# Patient Record
Sex: Male | Born: 1945 | Race: Black or African American | Hispanic: No | Marital: Married | Smoking: Former smoker
Health system: Southern US, Community
[De-identification: ages and names within clinical notes are randomized; demographics above are authoritative.]

## PROBLEM LIST (undated history)

## (undated) DIAGNOSIS — E785 Hyperlipidemia, unspecified: Secondary | ICD-10-CM

## (undated) HISTORY — DX: Hyperlipidemia, unspecified: E78.5

## (undated) HISTORY — PX: NO PAST SURGERIES: SHX2092

---

## 2016-06-20 DIAGNOSIS — N183 Chronic kidney disease, stage 3 (moderate): Secondary | ICD-10-CM | POA: Diagnosis not present

## 2016-06-20 DIAGNOSIS — E1122 Type 2 diabetes mellitus with diabetic chronic kidney disease: Secondary | ICD-10-CM | POA: Diagnosis not present

## 2016-06-20 DIAGNOSIS — R972 Elevated prostate specific antigen [PSA]: Secondary | ICD-10-CM | POA: Diagnosis not present

## 2016-06-20 DIAGNOSIS — Z0000 Encounter for general adult medical examination without abnormal findings: Secondary | ICD-10-CM | POA: Diagnosis not present

## 2017-01-01 DIAGNOSIS — R972 Elevated prostate specific antigen [PSA]: Secondary | ICD-10-CM | POA: Diagnosis not present

## 2017-01-01 DIAGNOSIS — E1122 Type 2 diabetes mellitus with diabetic chronic kidney disease: Secondary | ICD-10-CM | POA: Diagnosis not present

## 2017-01-01 DIAGNOSIS — N183 Chronic kidney disease, stage 3 (moderate): Secondary | ICD-10-CM | POA: Diagnosis not present

## 2017-06-26 DIAGNOSIS — I10 Essential (primary) hypertension: Secondary | ICD-10-CM | POA: Diagnosis not present

## 2017-06-26 DIAGNOSIS — E119 Type 2 diabetes mellitus without complications: Secondary | ICD-10-CM | POA: Diagnosis not present

## 2017-06-26 DIAGNOSIS — Z125 Encounter for screening for malignant neoplasm of prostate: Secondary | ICD-10-CM | POA: Diagnosis not present

## 2017-07-15 DIAGNOSIS — Z1212 Encounter for screening for malignant neoplasm of rectum: Secondary | ICD-10-CM | POA: Diagnosis not present

## 2017-07-15 DIAGNOSIS — Z1211 Encounter for screening for malignant neoplasm of colon: Secondary | ICD-10-CM | POA: Diagnosis not present

## 2017-10-14 DIAGNOSIS — R972 Elevated prostate specific antigen [PSA]: Secondary | ICD-10-CM | POA: Diagnosis not present

## 2017-10-14 DIAGNOSIS — N452 Orchitis: Secondary | ICD-10-CM | POA: Diagnosis not present

## 2017-10-14 DIAGNOSIS — R7303 Prediabetes: Secondary | ICD-10-CM | POA: Diagnosis not present

## 2017-10-14 DIAGNOSIS — N182 Chronic kidney disease, stage 2 (mild): Secondary | ICD-10-CM | POA: Diagnosis not present

## 2017-10-14 DIAGNOSIS — M182 Bilateral post-traumatic osteoarthritis of first carpometacarpal joints: Secondary | ICD-10-CM | POA: Diagnosis not present

## 2017-10-21 DIAGNOSIS — J069 Acute upper respiratory infection, unspecified: Secondary | ICD-10-CM | POA: Diagnosis not present

## 2017-11-06 DIAGNOSIS — D485 Neoplasm of uncertain behavior of skin: Secondary | ICD-10-CM | POA: Diagnosis not present

## 2017-11-06 DIAGNOSIS — B078 Other viral warts: Secondary | ICD-10-CM | POA: Diagnosis not present

## 2017-11-06 DIAGNOSIS — D229 Melanocytic nevi, unspecified: Secondary | ICD-10-CM | POA: Diagnosis not present

## 2017-11-06 DIAGNOSIS — L821 Other seborrheic keratosis: Secondary | ICD-10-CM | POA: Diagnosis not present

## 2018-01-13 DIAGNOSIS — R7303 Prediabetes: Secondary | ICD-10-CM | POA: Diagnosis not present

## 2018-01-13 DIAGNOSIS — H6121 Impacted cerumen, right ear: Secondary | ICD-10-CM | POA: Diagnosis not present

## 2018-01-13 DIAGNOSIS — Z0000 Encounter for general adult medical examination without abnormal findings: Secondary | ICD-10-CM | POA: Diagnosis not present

## 2018-04-14 DIAGNOSIS — R7309 Other abnormal glucose: Secondary | ICD-10-CM | POA: Diagnosis not present

## 2018-04-14 DIAGNOSIS — E785 Hyperlipidemia, unspecified: Secondary | ICD-10-CM | POA: Diagnosis not present

## 2018-04-14 DIAGNOSIS — N182 Chronic kidney disease, stage 2 (mild): Secondary | ICD-10-CM | POA: Diagnosis not present

## 2018-04-14 DIAGNOSIS — R7303 Prediabetes: Secondary | ICD-10-CM | POA: Diagnosis not present

## 2018-04-14 DIAGNOSIS — H6121 Impacted cerumen, right ear: Secondary | ICD-10-CM | POA: Diagnosis not present

## 2018-04-14 DIAGNOSIS — R799 Abnormal finding of blood chemistry, unspecified: Secondary | ICD-10-CM | POA: Diagnosis not present

## 2018-08-18 DIAGNOSIS — E785 Hyperlipidemia, unspecified: Secondary | ICD-10-CM | POA: Diagnosis not present

## 2018-08-18 DIAGNOSIS — R7303 Prediabetes: Secondary | ICD-10-CM | POA: Diagnosis not present

## 2018-12-22 DIAGNOSIS — Z0000 Encounter for general adult medical examination without abnormal findings: Secondary | ICD-10-CM | POA: Diagnosis not present

## 2018-12-22 DIAGNOSIS — E785 Hyperlipidemia, unspecified: Secondary | ICD-10-CM | POA: Diagnosis not present

## 2018-12-22 DIAGNOSIS — R7303 Prediabetes: Secondary | ICD-10-CM | POA: Diagnosis not present

## 2020-01-26 DIAGNOSIS — E785 Hyperlipidemia, unspecified: Secondary | ICD-10-CM | POA: Diagnosis not present

## 2020-03-09 DIAGNOSIS — Z20822 Contact with and (suspected) exposure to covid-19: Secondary | ICD-10-CM | POA: Diagnosis not present

## 2020-07-26 DIAGNOSIS — Z1212 Encounter for screening for malignant neoplasm of rectum: Secondary | ICD-10-CM | POA: Diagnosis not present

## 2020-07-26 DIAGNOSIS — Z1211 Encounter for screening for malignant neoplasm of colon: Secondary | ICD-10-CM | POA: Diagnosis not present

## 2020-08-16 DIAGNOSIS — H524 Presbyopia: Secondary | ICD-10-CM | POA: Diagnosis not present

## 2020-08-16 DIAGNOSIS — H43813 Vitreous degeneration, bilateral: Secondary | ICD-10-CM | POA: Diagnosis not present

## 2020-09-27 DIAGNOSIS — E785 Hyperlipidemia, unspecified: Secondary | ICD-10-CM | POA: Diagnosis not present

## 2020-09-27 DIAGNOSIS — R7303 Prediabetes: Secondary | ICD-10-CM | POA: Diagnosis not present

## 2020-11-28 DIAGNOSIS — J301 Allergic rhinitis due to pollen: Secondary | ICD-10-CM | POA: Diagnosis not present

## 2020-11-28 DIAGNOSIS — R7303 Prediabetes: Secondary | ICD-10-CM | POA: Diagnosis not present

## 2020-11-28 DIAGNOSIS — R051 Acute cough: Secondary | ICD-10-CM | POA: Diagnosis not present

## 2020-11-28 DIAGNOSIS — E785 Hyperlipidemia, unspecified: Secondary | ICD-10-CM | POA: Diagnosis not present

## 2020-11-28 DIAGNOSIS — R0789 Other chest pain: Secondary | ICD-10-CM | POA: Diagnosis not present

## 2020-12-03 ENCOUNTER — Encounter: Payer: Self-pay | Admitting: Emergency Medicine

## 2020-12-03 ENCOUNTER — Ambulatory Visit
Admission: EM | Admit: 2020-12-03 | Discharge: 2020-12-03 | Disposition: A | Discharge status: Home / Self Care | Payer: Medicare Other | Attending: Family Medicine | Admitting: Family Medicine

## 2020-12-03 DIAGNOSIS — M79671 Pain in right foot: Secondary | ICD-10-CM | POA: Diagnosis not present

## 2020-12-03 DIAGNOSIS — J069 Acute upper respiratory infection, unspecified: Secondary | ICD-10-CM

## 2020-12-03 DIAGNOSIS — G8929 Other chronic pain: Secondary | ICD-10-CM

## 2020-12-03 MED ORDER — PROMETHAZINE-DM 6.25-15 MG/5ML PO SYRP: 5.0000 mL | ORAL_SOLUTION | Freq: Three times a day (TID) | ORAL | 0 refills | Status: DC | PRN

## 2020-12-03 MED ORDER — PREDNISONE 20 MG PO TABS: 20.0000 mg | ORAL_TABLET | Freq: Every day | ORAL | 0 refills | Status: DC

## 2020-12-03 NOTE — Discharge Instructions (Signed)
Continue Zyrtec. Discontinue Mucinex. Take prednisone 20 mg daily with food for 5 days. Promethazine DM 3 times daily as needed for cough.

## 2020-12-03 NOTE — ED Triage Notes (Signed)
Dry cough x 1 week, right foot pain  x several years states it started to get worse over the past month.  Hx of broken foot back in the 60s.  States pain comes and goes.  States foot gives out sometimes while he is walking.  Was seen by Dr. Berdine Addison on Monday and was give zyrtec muccnex DM.

## 2020-12-03 NOTE — ED Provider Notes (Signed)
[facility]    CSN: [id number] Arrival date & time: 12/03/20  0804      History   Chief Complaint No chief complaint on file.   HPI Brett Bean is a 75 y.o. male.   HPI URI symptoms Patient presents with URI symptoms including cough, scratchy throat with coughing, mild headache, mild congestion. Home COVID test is negative. Cough is non productive and worsened overnight. Denies worrisome symptoms of shortness of breath, wheezing, or chest tightness. He has taken otc Mucinex and Cetirizine.  Foot pain Several years of intermittent right foot pain. Prior fracture in 60's. Reports over the last several months foot weakness and sensation as if his foot is giving out on him. No recent injury. No follow-up with specialist.  History reviewed. No pertinent past medical history.  There are no problems to display for this patient.   History reviewed. No pertinent surgical history.     Home Medications    Prior to Admission medications   Medication Sig Start Date End Date Taking? Authorizing Provider  predniSONE (DELTASONE) 20 MG tablet Take 1 tablet (20 mg total) by mouth daily with breakfast for 5 days. 12/03/20 12/08/20 Yes Scot Jun, FNP  promethazine-dextromethorphan (PROMETHAZINE-DM) 6.25-15 MG/5ML syrup Take 5 mLs by mouth 3 (three) times daily as needed for cough. 12/03/20  Yes Scot Jun, FNP    Family History Family History  Problem Relation Age of Onset   Cancer Mother    Heart failure Father    Heart murmur Father     Social History Social History   Tobacco Use   Smoking status: Former    Types: Cigarettes   Smokeless tobacco: Never  Substance Use Topics   Alcohol use: Never   Drug use: Never     Allergies   Asa [aspirin]   Review of Systems Review of Systems Pertinent negatives listed in HPI   Physical Exam Triage Vital Signs ED Triage Vitals  Enc Vitals Group     BP 12/03/20 0816 128/75     Pulse Rate  12/03/20 0816 86     Resp 12/03/20 0816 18     Temp 12/03/20 0816 98.6 F (37 C)     Temp Source 12/03/20 0816 Oral     SpO2 12/03/20 0816 91 %     Weight --      Height --      Head Circumference --      Peak Flow --      Pain Score 12/03/20 0818 0     Pain Loc --      Pain Edu? --      Excl. in [place]? --    No data found.  Updated Vital Signs BP 128/75 (BP Location: Right Arm)   Pulse 86   Temp 98.6 F (37 C) (Oral)   Resp 18   SpO2 91%   Visual Acuity Right Eye Distance:   Left Eye Distance:   Bilateral Distance:    Right Eye Near:   Left Eye Near:    Bilateral Near:     Physical Exam  General Appearance:    Alert, cooperative, no distress  HENT:   Normocephalic, ears normal, nares mucosal edema with congestion, rhinorrhea, oropharynx clear    Eyes:    PERRL, conjunctiva/corneas clear, EOM's intact       Lungs:     Clear to auscultation bilaterally, respirations unlabored  Heart:    Regular rate and rhythm  Neurologic:   Awake,  alert, oriented x 3. No apparent focal neurological           defect.      UC Treatments / Results  Labs (all labs ordered are listed, but only abnormal results are displayed) Labs Reviewed - No data to display  EKG   Radiology No results found.  Procedures Procedures (including critical care time)  Medications Ordered in UC Medications - No data to display  Initial Impression / Assessment and Plan / UC Course  I have reviewed the triage vital signs and the nursing notes.  Pertinent labs & imaging results that were available during my care of the patient were reviewed by me and considered in my medical decision making (see chart for details).    Viral upper respiratory illness.  Treatment per discharge medications orders. Chronic foot pain, follow-up with orthopedics and or podiatry. RTC PRN Final Clinical Impressions(s) / UC Diagnoses   Final diagnoses:  Viral upper respiratory tract infection with cough  Chronic  pain in right foot     Discharge Instructions      Continue Zyrtec. Discontinue Mucinex. Take prednisone 20 mg daily with food for 5 days. Promethazine DM 3 times daily as needed for cough.     ED Prescriptions     Medication Sig Dispense Auth. Provider   predniSONE (DELTASONE) 20 MG tablet Take 1 tablet (20 mg total) by mouth daily with breakfast for 5 days. 5 tablet Scot Jun, FNP   promethazine-dextromethorphan (PROMETHAZINE-DM) 6.25-15 MG/5ML syrup Take 5 mLs by mouth 3 (three) times daily as needed for cough. 140 mL Scot Jun, FNP      PDMP not reviewed this encounter.   Scot Jun, FNP 12/03/20 [phone]

## 2020-12-06 ENCOUNTER — Encounter: Payer: Self-pay | Admitting: *Deleted

## 2020-12-07 ENCOUNTER — Encounter: Payer: Self-pay | Admitting: Cardiology

## 2020-12-07 ENCOUNTER — Ambulatory Visit: Payer: Medicare Other | Admitting: Cardiology

## 2020-12-07 ENCOUNTER — Encounter: Payer: Self-pay | Admitting: *Deleted

## 2020-12-07 VITALS — BP 118/70 | HR 88 | Ht 68.0 in | Wt 182.8 lb

## 2020-12-07 DIAGNOSIS — R079 Chest pain, unspecified: Secondary | ICD-10-CM | POA: Diagnosis not present

## 2020-12-07 DIAGNOSIS — R9431 Abnormal electrocardiogram [ECG] [EKG]: Secondary | ICD-10-CM

## 2020-12-07 NOTE — Patient Instructions (Addendum)
Medication Instructions:  Your physician recommends that you continue on your current medications as directed. Please refer to the Current Medication list given to you today.  Labwork: none  Testing/Procedures: Your physician has requested that you have en exercise stress myoview. For further information please visit [email or web address]. Please follow instruction sheet, as given.  Follow-Up: Your physician recommends that you schedule a follow-up appointment in: pending  Any Other Special Instructions Will Be Listed Below (If Applicable).  If you need a refill on your cardiac medications before your next appointment, please call your pharmacy. 

## 2020-12-07 NOTE — Progress Notes (Signed)
     Clinical Summary Brett Bean is a 75 y.o.male seen today as a new consult, referred by Dr Berdine Addison for the following medical problems.  Chest pain - off and on for the last year - dull pain midchest, 3-4/10 in severity. Can occur at rest or with activity. No other associated symptoms. Can be worst with deep breathing. Lasts a few minutes. Few times a week. No relation to food - pushing mower can sometimes can cause symptoms.   CAD risk factors: 20 year smoking history, age.     2. Chronic cough -ongoign workup per pcp  Past Medical History:  Diagnosis Date   Hyperlipidemia      Allergies  Allergen Reactions   Asa [Aspirin]      Current Outpatient Medications  Medication Sig Dispense Refill   predniSONE (DELTASONE) 20 MG tablet Take 1 tablet (20 mg total) by mouth daily with breakfast for 5 days. 5 tablet 0   promethazine-dextromethorphan (PROMETHAZINE-DM) 6.25-15 MG/5ML syrup Take 5 mLs by mouth 3 (three) times daily as needed for cough. 140 mL 0   No current facility-administered medications for this visit.     Past Surgical History:  Procedure Laterality Date   NO PAST SURGERIES       Allergies  Allergen Reactions   Asa [Aspirin]       Family History  Problem Relation Age of Onset   Cancer Mother    Heart disease Father    Heart failure Father    Heart murmur Father    Hypertension Sister      Social History Brett Bean reports that he has quit smoking. His smoking use included cigarettes. He has never used smokeless tobacco. Brett Bean reports no history of alcohol use.   Review of Systems CONSTITUTIONAL: No weight loss, fever, chills, weakness or fatigue.  HEENT: Eyes: No visual loss, blurred vision, double vision or yellow sclerae.No hearing loss, sneezing, congestion, runny nose or sore throat.  SKIN: No rash or itching.  CARDIOVASCULAR: per hpi RESPIRATORY: No shortness of breath, cough or sputum.  GASTROINTESTINAL: No anorexia, nausea,  vomiting or diarrhea. No abdominal pain or blood.  GENITOURINARY: No burning on urination, no polyuria NEUROLOGICAL: No headache, dizziness, syncope, paralysis, ataxia, numbness or tingling in the extremities. No change in bowel or bladder control.  MUSCULOSKELETAL: No muscle, back pain, joint pain or stiffness.  LYMPHATICS: No enlarged nodes. No history of splenectomy.  PSYCHIATRIC: No history of depression or anxiety.  ENDOCRINOLOGIC: No reports of sweating, cold or heat intolerance. No polyuria or polydipsia.  Marland Kitchen   Physical Examination Today's Vitals   12/07/20 1038  BP: 118/70  Pulse: 88  SpO2: 95%  Weight: 182 lb 12.8 oz (82.9 kg)  Height: 5\' 8"  (1.727 m)   Body mass index is 27.79 kg/m.  Gen: resting comfortably, no acute distress HEENT: no scleral icterus, pupils equal round and reactive, no palptable cervical adenopathy,  CV: RRR, no m/r/g no jvd Resp: Clear to auscultation bilaterally GI: abdomen is soft, non-tender, non-distended, normal bowel sounds, no hepatosplenomegaly MSK: extremities are warm, no edema.  Skin: warm, no rash Neuro:  no focal deficits Psych: appropriate affect     Assessment and Plan  Chest pain -unclear etiology, mixed symptoms for possible cardiac etiology. At times can be exertional - obtain exercise nuclear stress today - EKG today shows SR, PACs   F/u pending stress findings   Brett Bean, M.D.,

## 2020-12-09 ENCOUNTER — Ambulatory Visit (HOSPITAL_COMMUNITY)
Admission: RE | Admit: 2020-12-09 | Discharge: 2020-12-09 | Disposition: A | Discharge status: Home / Self Care | Payer: Medicare Other | Source: Ambulatory Visit | Attending: Cardiology | Admitting: Cardiology

## 2020-12-09 ENCOUNTER — Other Ambulatory Visit: Payer: Self-pay

## 2020-12-09 ENCOUNTER — Encounter (HOSPITAL_COMMUNITY)
Admission: RE | Admit: 2020-12-09 | Discharge: 2020-12-09 | Disposition: A | Discharge status: Home / Self Care | Payer: Medicare Other | Source: Ambulatory Visit | Attending: Cardiology | Admitting: Cardiology

## 2020-12-09 ENCOUNTER — Encounter (HOSPITAL_COMMUNITY): Payer: Self-pay

## 2020-12-09 DIAGNOSIS — R079 Chest pain, unspecified: Secondary | ICD-10-CM | POA: Insufficient documentation

## 2020-12-09 DIAGNOSIS — R9431 Abnormal electrocardiogram [ECG] [EKG]: Secondary | ICD-10-CM | POA: Diagnosis not present

## 2020-12-09 LAB — NM MYOCAR MULTI W/SPECT W/WALL MOTION / EF
Angina Index: 0
Duke Treadmill Score: 2
Estimated workload: 4.6
Exercise duration (min): 1 min
Exercise duration (sec): 41 s
LV dias vol: 117 mL (ref 62–150)
LV sys vol: 52 mL
MPHR: 143 {beats}/min
Nuc Stress EF: 55 %
Peak HR: 134 {beats}/min
Percent HR: 93 %
RATE: 0.3
RPE: 13
Rest HR: 94 {beats}/min
Rest Nuclear Isotope Dose: 10.7 mCi
SDS: 3
SRS: 3
SSS: 6
ST Depression (mm): 0 mm
Stress Nuclear Isotope Dose: 32.8 mCi
TID: 1.1

## 2020-12-09 MED ORDER — TECHNETIUM TC 99M TETROFOSMIN IV KIT
10.0000 | PACK | Freq: Once | INTRAVENOUS | Status: AC | PRN
  Administered 2020-12-09: 11 via INTRAVENOUS

## 2020-12-09 MED ORDER — REGADENOSON 0.4 MG/5ML IV SOLN
INTRAVENOUS | Status: AC
  Filled 2020-12-09: qty 5

## 2020-12-09 MED ORDER — TECHNETIUM TC 99M TETROFOSMIN IV KIT
30.0000 | PACK | Freq: Once | INTRAVENOUS | Status: AC | PRN
  Administered 2020-12-09: 32.8 via INTRAVENOUS

## 2020-12-09 MED ORDER — SODIUM CHLORIDE FLUSH 0.9 % IV SOLN
INTRAVENOUS | Status: AC
  Administered 2020-12-09: 10 mL via INTRAVENOUS
  Filled 2020-12-09: qty 10

## 2020-12-14 ENCOUNTER — Emergency Department (HOSPITAL_COMMUNITY): Payer: Medicare Other

## 2020-12-14 ENCOUNTER — Encounter (HOSPITAL_COMMUNITY): Payer: Self-pay | Admitting: Emergency Medicine

## 2020-12-14 ENCOUNTER — Inpatient Hospital Stay (HOSPITAL_COMMUNITY)
Admission: EM | Admit: 2020-12-14 | Discharge: 2020-12-18 | DRG: 193 | Disposition: A | Discharge status: Home / Self Care | Payer: Medicare Other | Attending: Family Medicine | Admitting: Family Medicine

## 2020-12-14 ENCOUNTER — Other Ambulatory Visit: Payer: Self-pay

## 2020-12-14 DIAGNOSIS — Z20822 Contact with and (suspected) exposure to covid-19: Secondary | ICD-10-CM | POA: Diagnosis present

## 2020-12-14 DIAGNOSIS — Z8249 Family history of ischemic heart disease and other diseases of the circulatory system: Secondary | ICD-10-CM

## 2020-12-14 DIAGNOSIS — E785 Hyperlipidemia, unspecified: Secondary | ICD-10-CM | POA: Diagnosis present

## 2020-12-14 DIAGNOSIS — R072 Precordial pain: Secondary | ICD-10-CM | POA: Diagnosis present

## 2020-12-14 DIAGNOSIS — R6510 Systemic inflammatory response syndrome (SIRS) of non-infectious origin without acute organ dysfunction: Secondary | ICD-10-CM | POA: Diagnosis not present

## 2020-12-14 DIAGNOSIS — J189 Pneumonia, unspecified organism: Principal | ICD-10-CM

## 2020-12-14 DIAGNOSIS — R778 Other specified abnormalities of plasma proteins: Secondary | ICD-10-CM

## 2020-12-14 DIAGNOSIS — Z87891 Personal history of nicotine dependence: Secondary | ICD-10-CM | POA: Diagnosis not present

## 2020-12-14 DIAGNOSIS — J9601 Acute respiratory failure with hypoxia: Secondary | ICD-10-CM | POA: Diagnosis not present

## 2020-12-14 DIAGNOSIS — J9690 Respiratory failure, unspecified, unspecified whether with hypoxia or hypercapnia: Secondary | ICD-10-CM

## 2020-12-14 DIAGNOSIS — Z79899 Other long term (current) drug therapy: Secondary | ICD-10-CM | POA: Diagnosis not present

## 2020-12-14 DIAGNOSIS — I517 Cardiomegaly: Secondary | ICD-10-CM | POA: Diagnosis not present

## 2020-12-14 DIAGNOSIS — F17210 Nicotine dependence, cigarettes, uncomplicated: Secondary | ICD-10-CM | POA: Diagnosis not present

## 2020-12-14 DIAGNOSIS — K449 Diaphragmatic hernia without obstruction or gangrene: Secondary | ICD-10-CM | POA: Diagnosis not present

## 2020-12-14 DIAGNOSIS — J869 Pyothorax without fistula: Secondary | ICD-10-CM

## 2020-12-14 DIAGNOSIS — Z886 Allergy status to analgesic agent status: Secondary | ICD-10-CM

## 2020-12-14 DIAGNOSIS — R059 Cough, unspecified: Secondary | ICD-10-CM | POA: Diagnosis not present

## 2020-12-14 DIAGNOSIS — D72829 Elevated white blood cell count, unspecified: Secondary | ICD-10-CM | POA: Diagnosis not present

## 2020-12-14 DIAGNOSIS — E871 Hypo-osmolality and hyponatremia: Secondary | ICD-10-CM | POA: Diagnosis not present

## 2020-12-14 DIAGNOSIS — D649 Anemia, unspecified: Secondary | ICD-10-CM | POA: Diagnosis not present

## 2020-12-14 DIAGNOSIS — R0602 Shortness of breath: Secondary | ICD-10-CM | POA: Diagnosis not present

## 2020-12-14 DIAGNOSIS — J90 Pleural effusion, not elsewhere classified: Secondary | ICD-10-CM | POA: Diagnosis not present

## 2020-12-14 DIAGNOSIS — R7989 Other specified abnormal findings of blood chemistry: Secondary | ICD-10-CM

## 2020-12-14 DIAGNOSIS — R079 Chest pain, unspecified: Secondary | ICD-10-CM | POA: Diagnosis not present

## 2020-12-14 DIAGNOSIS — R5383 Other fatigue: Secondary | ICD-10-CM | POA: Diagnosis not present

## 2020-12-14 DIAGNOSIS — J918 Pleural effusion in other conditions classified elsewhere: Secondary | ICD-10-CM | POA: Diagnosis present

## 2020-12-14 LAB — COMPREHENSIVE METABOLIC PANEL
ALT: 68 U/L — ABNORMAL HIGH (ref 0–44)
AST: 65 U/L — ABNORMAL HIGH (ref 15–41)
Albumin: 2.2 g/dL — ABNORMAL LOW (ref 3.5–5.0)
Alkaline Phosphatase: 143 U/L — ABNORMAL HIGH (ref 38–126)
Anion gap: 12 (ref 5–15)
BUN: 11 mg/dL (ref 8–23)
CO2: 25 mmol/L (ref 22–32)
Calcium: 8.2 mg/dL — ABNORMAL LOW (ref 8.9–10.3)
Chloride: 93 mmol/L — ABNORMAL LOW (ref 98–111)
Creatinine, Ser: 1.05 mg/dL (ref 0.61–1.24)
GFR, Estimated: >60 mL/min (ref >60)
Glucose, Bld: 106 mg/dL — ABNORMAL HIGH (ref 70–99)
Potassium: 3.7 mmol/L (ref 3.5–5.1)
Sodium: 130 mmol/L — ABNORMAL LOW (ref 135–145)
Total Bilirubin: 1.2 mg/dL (ref 0.3–1.2)
Total Protein: 6.9 g/dL (ref 6.5–8.1)

## 2020-12-14 LAB — CBC WITH DIFFERENTIAL/PLATELET
Abs Immature Granulocytes: 0.32 10*3/uL — ABNORMAL HIGH (ref 0.00–0.07)
Basophils Absolute: 0.1 10*3/uL (ref 0.0–0.1)
Basophils Relative: 0 %
Eosinophils Absolute: 0.1 10*3/uL (ref 0.0–0.5)
Eosinophils Relative: 0 %
HCT: 33.6 % — ABNORMAL LOW (ref 39.0–52.0)
Hemoglobin: 10.4 g/dL — ABNORMAL LOW (ref 13.0–17.0)
Immature Granulocytes: 2 %
Lymphocytes Relative: 10 %
Lymphs Abs: 2.1 10*3/uL (ref 0.7–4.0)
MCH: 28.8 pg (ref 26.0–34.0)
MCHC: 31 g/dL (ref 30.0–36.0)
MCV: 93.1 fL (ref 80.0–100.0)
Monocytes Absolute: 1.7 10*3/uL — ABNORMAL HIGH (ref 0.1–1.0)
Monocytes Relative: 8 %
Neutro Abs: 17.6 10*3/uL — ABNORMAL HIGH (ref 1.7–7.7)
Neutrophils Relative %: 80 %
Platelets: 442 10*3/uL — ABNORMAL HIGH (ref 150–400)
RBC: 3.61 MIL/uL — ABNORMAL LOW (ref 4.22–5.81)
RDW: 13.4 % (ref 11.5–15.5)
WBC: 21.8 10*3/uL — ABNORMAL HIGH (ref 4.0–10.5)
nRBC: 0 % (ref 0.0–0.2)

## 2020-12-14 LAB — TROPONIN I (HIGH SENSITIVITY)
Troponin I (High Sensitivity): 73 ng/L — ABNORMAL HIGH (ref <18)
Troponin I (High Sensitivity): 24 ng/L — ABNORMAL HIGH (ref <18)

## 2020-12-14 MED ORDER — ACETAMINOPHEN 325 MG PO TABS
650.0000 mg | ORAL_TABLET | Freq: Once | ORAL | Status: AC
  Administered 2020-12-14: 650 mg via ORAL

## 2020-12-14 MED ORDER — IOHEXOL 350 MG/ML SOLN
62.0000 mL | Freq: Once | INTRAVENOUS | Status: AC | PRN
  Administered 2020-12-14: 62 mL via INTRAVENOUS

## 2020-12-14 MED ORDER — ACETAMINOPHEN 325 MG PO TABS
325.0000 mg | ORAL_TABLET | Freq: Once | ORAL | Status: DC
  Filled 2020-12-14: qty 1

## 2020-12-14 MED ORDER — SODIUM CHLORIDE 0.9 % IV SOLN
500.0000 mg | Freq: Once | INTRAVENOUS | Status: AC
  Administered 2020-12-14: 500 mg via INTRAVENOUS
  Filled 2020-12-14: qty 500

## 2020-12-14 MED ORDER — SODIUM CHLORIDE 0.9 % IV SOLN
2.0000 g | Freq: Once | INTRAVENOUS | Status: AC
  Administered 2020-12-14: 2 g via INTRAVENOUS
  Filled 2020-12-14: qty 20

## 2020-12-14 NOTE — ED Triage Notes (Signed)
Pt here from home for cp x 1.5 wks, pt went to PCP who prescribed mucinex and zyrtec, no relief. Pt went to UC who prescribed prednisone w/ some relief. Pt reports central cp that radiates to R chest, denies shob, N/V, dizziness, lightheadedness. Reports fevers at home max 101, and dry cough.

## 2020-12-14 NOTE — ED Provider Notes (Signed)
Emergency Medicine Provider Triage Evaluation Note  Brett Bean , a 75 y.o. male  was evaluated in triage.  Pt complains of chest pain.  Is been ongoing for a year, worsened this last week.  Reports having a cough and shortness of breath, denies any nausea or vomiting.  Reports swelling around his ankles bilaterally.  Pain is mostly on the right side of his chest, worse with movement and inspiration.  No history of previous MI, no hypertension, no diabetes history.  Has tried Mucinex, Zyrtec, prednisone without any relief or change in symptoms.  Review of Systems  Positive: Chest pain, shortness of breath Negative: Nausea, vomiting  Physical Exam  BP (!) 156/74 (BP Location: Right Arm)   Pulse (!) 104   Temp 100 F (37.8 C)   Resp 18   SpO2 98%  Gen:   Awake, no distress   Resp:  Normal effort  MSK:   Moves extremities without difficulty  Other:  Mildly tachycardic, radial pulses equal bilaterally.  Medical Decision Making  Medically screening exam initiated at 3:42 PM.  Appropriate orders placed.  DURREL MCNEE was informed that the remainder of the evaluation will be completed by another provider, this initial triage assessment does not replace that evaluation, and the importance of remaining in the ED until their evaluation is complete.  Chest pain work-up   Sherrill Raring, Hershal Coria 12/14/20 1543    Isla Pence, MD 12/15/20 [phone]

## 2020-12-14 NOTE — ED Provider Notes (Addendum)
[facility] EMERGENCY DEPARTMENT Provider Note   CSN: [id number] Arrival date & time: 12/14/20  1525     History Chief Complaint  Patient presents with   Chest Pain    Brett Bean is a 75 y.o. male.  75 year old male presents today for evaluation of right-sided chest pain and productive cough.  Patient reports his chest pain initially started 1.5 weeks ago.  At the time it was substernal pressure-like and nonradiating.  He was treated by his PCP and urgent care with p.o. medications without any relief.  He mentions over the past few days his chest pain has migrated to the right side of his chest.  Endorses rusty sputum with intermittent hemoptysis.  He reports fever with T-max of 101.0 at home over the past week.  He denies dyspnea at rest or on exertion.  He was referred to cardiology for his chest pain where he had a stress test on 10/7 which was normal.  He denies abdominal pain, nausea, vomiting, or lightheadedness.  He is a former smoker with 15-pack-year smoking history.  Denies prior cardiac history.  The history is provided by the patient. No language interpreter was used.      Past Medical History:  Diagnosis Date   Hyperlipidemia     There are no problems to display for this patient.   Past Surgical History:  Procedure Laterality Date   NO PAST SURGERIES         Family History  Problem Relation Age of Onset   Cancer Mother    Heart disease Father    Heart failure Father    Heart murmur Father    Hypertension Sister     Social History   Tobacco Use   Smoking status: Former    Types: Cigarettes   Smokeless tobacco: Never  Substance Use Topics   Alcohol use: Never   Drug use: Never    Home Medications Prior to Admission medications   Not on File    Allergies    Asa [aspirin]  Review of Systems   Review of Systems  Constitutional:  Positive for chills and fever. Negative for activity change, appetite change, diaphoresis  and fatigue.  Eyes:  Negative for visual disturbance.  Respiratory:  Positive for chest tightness. Negative for shortness of breath.   Cardiovascular:  Positive for chest pain. Negative for palpitations and leg swelling.  Gastrointestinal:  Negative for abdominal distention, abdominal pain, constipation, diarrhea, nausea and vomiting.  Skin:  Negative for wound.  Neurological:  Negative for weakness and light-headedness.  All other systems reviewed and are negative.  Physical Exam Updated Vital Signs BP (!) 156/74 (BP Location: Right Arm)   Pulse (!) 104   Temp 100 F (37.8 C)   Resp 18   SpO2 98%   Physical Exam Vitals and nursing note reviewed.  Constitutional:      General: He is not in acute distress.    Appearance: Normal appearance. He is not ill-appearing.  HENT:     Head: Normocephalic and atraumatic.     Nose: Nose normal.  Eyes:     General: No scleral icterus.    Extraocular Movements: Extraocular movements intact.     Conjunctiva/sclera: Conjunctivae normal.  Cardiovascular:     Rate and Rhythm: Regular rhythm. Tachycardia present.     Pulses: Normal pulses.     Heart sounds: Normal heart sounds.  Pulmonary:     Effort: Pulmonary effort is normal. No respiratory distress.  Breath sounds: No wheezing or rales.     Comments: Coarse lung sounds in right lung Abdominal:     Tenderness: There is no abdominal tenderness. There is no guarding.  Musculoskeletal:        General: Normal range of motion.     Cervical back: Normal range of motion.     Right lower leg: Edema (Trace edema around ankle) present.     Left lower leg: Edema (Trace edema around ankle) present.  Skin:    General: Skin is warm and dry.  Neurological:     General: No focal deficit present.     Mental Status: He is alert. Mental status is at baseline.    ED Results / Procedures / Treatments   Labs (all labs ordered are listed, but only abnormal results are displayed) Labs Reviewed   COMPREHENSIVE METABOLIC PANEL - Abnormal; Notable for the following components:      Result Value   Sodium 130 (*)    Chloride 93 (*)    Glucose, Bld 106 (*)    Calcium 8.2 (*)    Albumin 2.2 (*)    AST 65 (*)    ALT 68 (*)    Alkaline Phosphatase 143 (*)    All other components within normal limits  CBC WITH DIFFERENTIAL/PLATELET - Abnormal; Notable for the following components:   WBC 21.8 (*)    RBC 3.61 (*)    Hemoglobin 10.4 (*)    HCT 33.6 (*)    Platelets 442 (*)    Neutro Abs 17.6 (*)    Monocytes Absolute 1.7 (*)    Abs Immature Granulocytes 0.32 (*)    All other components within normal limits  TROPONIN I (HIGH SENSITIVITY) - Abnormal; Notable for the following components:   Troponin I (High Sensitivity) 73 (*)    All other components within normal limits  TROPONIN I (HIGH SENSITIVITY) - Abnormal; Notable for the following components:   Troponin I (High Sensitivity) 24 (*)    All other components within normal limits    EKG None  Radiology DG Chest 2 View  Result Date: 12/14/2020 CLINICAL DATA:  Chest pain short of breath and cough EXAM: CHEST - 2 VIEW COMPARISON:  None. FINDINGS: Right lower lobe consolidation with associated right pleural effusion. Left lung is clear. Negative for heart failure or edema. IMPRESSION: Right lower lobe airspace disease and right effusion. Possible pneumonia or mass lesion. Followup PA and lateral chest X-ray is recommended in 3-4 weeks following trial of antibiotic therapy to ensure resolution and exclude underlying malignancy. Electronically Signed   By: Franchot Gallo M.D.   On: 12/14/2020 16:33    Procedures .Critical Care Performed by: Evlyn Courier, PA-C Authorized by: Evlyn Courier, PA-C   Critical care provider statement:    Critical care time (minutes):  40   Critical care was necessary to treat or prevent imminent or life-threatening deterioration of the following conditions:  Respiratory failure   Critical care was time  spent personally by me on the following activities:  Ordering and performing treatments and interventions, development of treatment plan with patient or surrogate, ordering and review of laboratory studies, ordering and review of radiographic studies, evaluation of patient's response to treatment and re-evaluation of patient's condition   Medications Ordered in ED Medications  cefTRIAXone (ROCEPHIN) 2 g in sodium chloride 0.9 % 100 mL IVPB (has no administration in time range)  azithromycin (ZITHROMAX) 500 mg in sodium chloride 0.9 % 250 mL IVPB (has no administration  in time range)  acetaminophen (TYLENOL) tablet 650 mg (650 mg Oral Given 12/14/20 1551)    ED Course  I have reviewed the triage vital signs and the nursing notes.  Pertinent labs & imaging results that were available during my care of the patient were reviewed by me and considered in my medical decision making (see chart for details).    MDM Rules/Calculators/A&P                           76 year old male presents today for evaluation of chest pain and productive cough of 1.5-week duration.  Patient was evaluated at cardiology office for chest pain and had a normal NST.  His evaluation in triage is significant for leukocytosis of 21.4, globin of 10.4, sodium of 130, troponin initially 73 then down trended to 24.  Currently chest pain is only right-sided reproducible with palpation and pleuritic.  Chest x-ray significant for pneumonia with associated pleural effusion.  EKG with PACs but otherwise without acute ischemic changes.  Given hemoptysis will order CTA to rule out PE or malignancy.  We will start IV Rocephin and Zithromax to cover for community-acquired pneumonia.  CT angio pending.    On reevaluation patient has rigors complaining of feeling cold and was wrapped up in multiple blankets.  Repeat temperature 99.4.  Patient hypoxic at 85% on room air requiring 2 L.  Tachypneic at 34.  Discussed with hospitalist who will  evaluate patient for admission.   Final Clinical Impression(s) / ED Diagnoses Final diagnoses:  Community acquired pneumonia of right lung, unspecified part of lung  Respiratory failure, unspecified chronicity, unspecified whether with hypoxia or hypercapnia (HCC)  Leukocytosis, unspecified type  Elevated troponin  Pleural effusion    Rx / DC Orders ED Discharge Orders     None        Evlyn Courier, PA-C 12/15/20 0022    Evlyn Courier, PA-C 12/15/20 0024    Lucrezia Starch, MD 12/16/20 2136

## 2020-12-15 ENCOUNTER — Encounter (HOSPITAL_COMMUNITY): Payer: Self-pay | Admitting: Internal Medicine

## 2020-12-15 ENCOUNTER — Inpatient Hospital Stay (HOSPITAL_COMMUNITY): Payer: Medicare Other

## 2020-12-15 DIAGNOSIS — E785 Hyperlipidemia, unspecified: Secondary | ICD-10-CM | POA: Diagnosis present

## 2020-12-15 DIAGNOSIS — R072 Precordial pain: Secondary | ICD-10-CM | POA: Diagnosis present

## 2020-12-15 DIAGNOSIS — Z886 Allergy status to analgesic agent status: Secondary | ICD-10-CM | POA: Diagnosis not present

## 2020-12-15 DIAGNOSIS — J189 Pneumonia, unspecified organism: Secondary | ICD-10-CM | POA: Diagnosis present

## 2020-12-15 DIAGNOSIS — J9601 Acute respiratory failure with hypoxia: Secondary | ICD-10-CM

## 2020-12-15 DIAGNOSIS — D649 Anemia, unspecified: Secondary | ICD-10-CM | POA: Diagnosis present

## 2020-12-15 DIAGNOSIS — I517 Cardiomegaly: Secondary | ICD-10-CM | POA: Diagnosis not present

## 2020-12-15 DIAGNOSIS — D72829 Elevated white blood cell count, unspecified: Secondary | ICD-10-CM | POA: Diagnosis not present

## 2020-12-15 DIAGNOSIS — Z8249 Family history of ischemic heart disease and other diseases of the circulatory system: Secondary | ICD-10-CM | POA: Diagnosis not present

## 2020-12-15 DIAGNOSIS — Z79899 Other long term (current) drug therapy: Secondary | ICD-10-CM | POA: Diagnosis not present

## 2020-12-15 DIAGNOSIS — J90 Pleural effusion, not elsewhere classified: Secondary | ICD-10-CM | POA: Diagnosis not present

## 2020-12-15 DIAGNOSIS — R079 Chest pain, unspecified: Secondary | ICD-10-CM | POA: Diagnosis not present

## 2020-12-15 DIAGNOSIS — J869 Pyothorax without fistula: Secondary | ICD-10-CM | POA: Diagnosis not present

## 2020-12-15 DIAGNOSIS — Z87891 Personal history of nicotine dependence: Secondary | ICD-10-CM | POA: Diagnosis not present

## 2020-12-15 DIAGNOSIS — J918 Pleural effusion in other conditions classified elsewhere: Secondary | ICD-10-CM | POA: Diagnosis present

## 2020-12-15 DIAGNOSIS — Z20822 Contact with and (suspected) exposure to covid-19: Secondary | ICD-10-CM | POA: Diagnosis present

## 2020-12-15 DIAGNOSIS — R5383 Other fatigue: Secondary | ICD-10-CM | POA: Diagnosis not present

## 2020-12-15 DIAGNOSIS — R6510 Systemic inflammatory response syndrome (SIRS) of non-infectious origin without acute organ dysfunction: Secondary | ICD-10-CM | POA: Diagnosis present

## 2020-12-15 DIAGNOSIS — E871 Hypo-osmolality and hyponatremia: Secondary | ICD-10-CM | POA: Diagnosis present

## 2020-12-15 LAB — COMPREHENSIVE METABOLIC PANEL
ALT: 65 U/L — ABNORMAL HIGH (ref 0–44)
AST: 54 U/L — ABNORMAL HIGH (ref 15–41)
Albumin: 2.4 g/dL — ABNORMAL LOW (ref 3.5–5.0)
Alkaline Phosphatase: 147 U/L — ABNORMAL HIGH (ref 38–126)
Anion gap: 12 (ref 5–15)
BUN: 11 mg/dL (ref 8–23)
CO2: 23 mmol/L (ref 22–32)
Calcium: 8.2 mg/dL — ABNORMAL LOW (ref 8.9–10.3)
Chloride: 97 mmol/L — ABNORMAL LOW (ref 98–111)
Creatinine, Ser: 0.97 mg/dL (ref 0.61–1.24)
GFR, Estimated: >60 mL/min (ref >60)
Glucose, Bld: 116 mg/dL — ABNORMAL HIGH (ref 70–99)
Potassium: 3.8 mmol/L (ref 3.5–5.1)
Sodium: 132 mmol/L — ABNORMAL LOW (ref 135–145)
Total Bilirubin: 1.2 mg/dL (ref 0.3–1.2)
Total Protein: 6.9 g/dL (ref 6.5–8.1)

## 2020-12-15 LAB — CBC
HCT: 36.2 % — ABNORMAL LOW (ref 39.0–52.0)
Hemoglobin: 11.3 g/dL — ABNORMAL LOW (ref 13.0–17.0)
MCH: 29.2 pg (ref 26.0–34.0)
MCHC: 31.2 g/dL (ref 30.0–36.0)
MCV: 93.5 fL (ref 80.0–100.0)
Platelets: 451 10*3/uL — ABNORMAL HIGH (ref 150–400)
RBC: 3.87 MIL/uL — ABNORMAL LOW (ref 4.22–5.81)
RDW: 13.5 % (ref 11.5–15.5)
WBC: 22.1 10*3/uL — ABNORMAL HIGH (ref 4.0–10.5)
nRBC: 0 % (ref 0.0–0.2)

## 2020-12-15 LAB — RESP PANEL BY RT-PCR (FLU A&B, COVID) ARPGX2
Influenza A by PCR: NEGATIVE
Influenza B by PCR: NEGATIVE
SARS Coronavirus 2 by RT PCR: NEGATIVE

## 2020-12-15 LAB — PROTIME-INR
INR: 1.4 — ABNORMAL HIGH (ref 0.8–1.2)
Prothrombin Time: 16.7 seconds — ABNORMAL HIGH (ref 11.4–15.2)

## 2020-12-15 LAB — STREP PNEUMONIAE URINARY ANTIGEN: Strep Pneumo Urinary Antigen: NEGATIVE

## 2020-12-15 LAB — MRSA NEXT GEN BY PCR, NASAL: MRSA by PCR Next Gen: NOT DETECTED

## 2020-12-15 MED ORDER — SODIUM CHLORIDE 0.9 % IV SOLN
500.0000 mg | INTRAVENOUS | Status: DC
  Administered 2020-12-15 – 2020-12-17 (×3): 500 mg via INTRAVENOUS
  Filled 2020-12-15 (×4): qty 500

## 2020-12-15 MED ORDER — LIDOCAINE HCL 1 % IJ SOLN
INTRAMUSCULAR | Status: AC
  Filled 2020-12-15: qty 10

## 2020-12-15 MED ORDER — ONDANSETRON HCL 4 MG/2ML IJ SOLN: 4.0000 mg | Freq: Four times a day (QID) | INTRAMUSCULAR | Status: DC | PRN

## 2020-12-15 MED ORDER — MIDAZOLAM HCL 2 MG/2ML IJ SOLN
INTRAMUSCULAR | Status: DC | PRN
  Administered 2020-12-15 (×2): 1 mg via INTRAVENOUS

## 2020-12-15 MED ORDER — ORAL CARE MOUTH RINSE: 15.0000 mL | Freq: Two times a day (BID) | OROMUCOSAL | Status: DC

## 2020-12-15 MED ORDER — CEFTRIAXONE SODIUM 2 G IJ SOLR
2.0000 g | INTRAMUSCULAR | Status: DC
  Administered 2020-12-15 – 2020-12-17 (×3): 2 g via INTRAVENOUS
  Filled 2020-12-15 (×4): qty 20

## 2020-12-15 MED ORDER — ONDANSETRON HCL 4 MG PO TABS: 4.0000 mg | ORAL_TABLET | Freq: Four times a day (QID) | ORAL | Status: DC | PRN

## 2020-12-15 MED ORDER — ORAL CARE MOUTH RINSE
15.0000 mL | Freq: Two times a day (BID) | OROMUCOSAL | Status: DC
  Administered 2020-12-15 – 2020-12-17 (×5): 15 mL via OROMUCOSAL

## 2020-12-15 MED ORDER — ACETAMINOPHEN 325 MG PO TABS
650.0000 mg | ORAL_TABLET | Freq: Four times a day (QID) | ORAL | Status: DC | PRN
  Administered 2020-12-15 – 2020-12-16 (×3): 650 mg via ORAL
  Filled 2020-12-15 (×3): qty 2

## 2020-12-15 MED ORDER — LACTATED RINGERS IV SOLN: INTRAVENOUS | Status: DC

## 2020-12-15 MED ORDER — MIDAZOLAM HCL 2 MG/2ML IJ SOLN
INTRAMUSCULAR | Status: AC
  Filled 2020-12-15: qty 4

## 2020-12-15 MED ORDER — ENOXAPARIN SODIUM 40 MG/0.4ML IJ SOSY
40.0000 mg | PREFILLED_SYRINGE | INTRAMUSCULAR | Status: DC
  Administered 2020-12-16 – 2020-12-17 (×2): 40 mg via SUBCUTANEOUS
  Filled 2020-12-15 (×2): qty 0.4

## 2020-12-15 MED ORDER — FENTANYL CITRATE (PF) 100 MCG/2ML IJ SOLN
INTRAMUSCULAR | Status: AC
  Filled 2020-12-15: qty 4

## 2020-12-15 MED ORDER — ACETAMINOPHEN 650 MG RE SUPP: 650.0000 mg | Freq: Four times a day (QID) | RECTAL | Status: DC | PRN

## 2020-12-15 MED ORDER — FENTANYL CITRATE (PF) 100 MCG/2ML IJ SOLN
INTRAMUSCULAR | Status: DC | PRN
  Administered 2020-12-15: 50 ug via INTRAVENOUS

## 2020-12-15 NOTE — Progress Notes (Signed)
  INTERVAL PROGRESS NOTE    Brett Bean- 75 y.o. male  LOS: 0 __________________________________________________________________  SUBJECTIVE: Admitted 12/14/2020 with cc of  Chief Complaint  Patient presents with   Chest Pain  Patient admitted with elevated HR, febrile on 2L [name].  OBJECTIVE: Blood pressure (!) 148/61, pulse 79, temperature 97.8 F (36.6 C), temperature source Oral, resp. rate (!) 23, height 5\' 8"  (1.727 m), weight 79.6 kg, SpO2 95 %.  ASSESSMENT/PLAN:  I have reviewed the full H&P by Dr. Alcario Drought, and I agree with the assessment and plan as outlined therein. In addition: Contacted CVTS this morning who recommended OR intervention. Pigtail catheter was placed. Shortly after, patient was able to tolerate being ORA.  - PRN analgesia - CBC, BMP am  Principal Problem:   CAP (community acquired pneumonia) Active Problems:   Acute respiratory failure with hypoxia ([place])   Pleural effusion  Brett Osmond, DO [facility] 12/15/2020, 12:38 PM    [email or web address] Available by Epic secure chat 7AM-7PM. If 7PM-7AM, please contact night-coverage   No Charge

## 2020-12-15 NOTE — Consult Note (Addendum)
[address]       [place], [address]             [phone]        Dino W Villamar  Medical Record #[id number] Date of Birth: [birth year]  Referring: Doristine Mango, MD Primary Care: Iona Beard, MD Primary Cardiologist:Branch, Roderic Palau, MD  Chief Complaint:   Chest pain, productive cough, fever, and fatigue.   History of Present Illness: Mr. Brett Bean is a pleasant 75 year old gentleman who is a former smoker but otherwise has no significant past medical history.  He began having a nonproductive cough with associated fatigue and decreased appetite about 2 weeks ago.  He was seen by his primary care provider and was felt to have symptoms related to an allergy.  He was treated with Zyrtec and Mucinex.  His symptoms persisted so he was seen in the emergency room at [facility] on 12/03/2020.  His home COVID test was negative.  His symptoms were felt to be related to an acute viral upper respiratory infection.  He was treated with oral prednisone and promethazine DM for cough.  Since he was having some chest pain associated with these symptoms,   he was referred to cardiology.  He was seen by Dr. Carlyle Dolly on 12/07/2020.  EKG performed in the office showed sinus rhythm with PACs.  An exercise nuclear stress test was performed on compared with 7-8 out of 10 yesterday.  He is 5 and showed no evidence of ischemia. Yesterday, return to the emergency room due to worsening chest pain along with shortness of breath and fever.  His cough has also become productive at this point.  In the emergency room, his temperature was 101 F.  White blood count was 21,400.  Chest x-ray showed a moderate to large right pleural effusion with significant right lower lobe airspace disease.  Broad-spectrum antibiotics were started for suspected pneumonia.  CTA of the chest followed to rule out PE.  This demonstrated a moderate to large right loculated pleural effusion.  Mr.  Brett Bean was admitted to the hospital and IV antibiotics were continued.  He has also been started on lactated Ringer's at 100 mL/h.  This morning, CT surgery was consulted to assist with management.  Mr. Brett Bean reports to me that he feels much better than when he presented to the emergency room yesterday.  He rates his chest pain at a 1.5 out of 10 compared to 7-8 out of 10 when he presented yesterday.  He is currently on room air with oxygen saturation of 93 to 94%.   Current Activity/ Functional Status:    Zubrod Score: At the time of surgery this patient's most appropriate activity status/level should be described as: []     0    Normal activity, no symptoms [x]     1    Restricted in physical strenuous activity but ambulatory, able to do out light work []     2    Ambulatory and capable of self care, unable to do work activities, up and about                 more than 50%  Of the time                            []     3    Only limited self care, in bed greater than 50% of waking hours []   4    Completely disabled, no self care, confined to bed or chair []     5    Moribund  Past Medical History:  Diagnosis Date   Hyperlipidemia     Past Surgical History:  Procedure Laterality Date   NO PAST SURGERIES      Social History   Tobacco Use  Smoking Status Former   Types: Cigarettes  Smokeless Tobacco Never    Social History   Substance and Sexual Activity  Alcohol Use Never     Allergies  Allergen Reactions   Asa [Aspirin]     Current Facility-Administered Medications  Medication Dose Route Frequency Provider Last Rate Last Admin   acetaminophen (TYLENOL) tablet 650 mg  650 mg Oral Q6H PRN Brett Quill, DO   650 mg at 12/15/20 0230   Or   acetaminophen (TYLENOL) suppository 650 mg  650 mg Rectal Q6H PRN Brett Quill, DO       azithromycin (ZITHROMAX) 500 mg in sodium chloride 0.9 % 250 mL IVPB  500 mg Intravenous Q24H Alcario Drought, Jared M, DO       cefTRIAXone  (ROCEPHIN) 2 g in sodium chloride 0.9 % 100 mL IVPB  2 g Intravenous Q24H Jennette Kettle M, DO       [START ON 12/16/2020] enoxaparin (LOVENOX) injection 40 mg  40 mg Subcutaneous Q24H Alcario Drought, Jared M, DO       lactated ringers infusion   Intravenous Continuous Brett Quill, DO 100 mL/hr at 12/15/20 0325 Infusion Verify at 12/15/20 0325   MEDLINE mouth rinse  15 mL Mouth Rinse BID Jennette Kettle M, DO       ondansetron [facility]) tablet 4 mg  4 mg Oral Q6H PRN Brett Quill, DO       Or   ondansetron [facility]) injection 4 mg  4 mg Intravenous Q6H PRN Brett Quill, DO        Medications Prior to Admission  Medication Sig Dispense Refill Last Dose   cholecalciferol (VITAMIN D) 25 MCG (1000 UNIT) tablet Take 1,000 Units by mouth daily.   12/14/2020   Misc Natural Products (PROSTATE) CAPS Take 1 capsule by mouth in the morning and at bedtime. supplement   12/14/2020   Multiple Vitamins-Minerals (EYE VITAMINS PO) Take 1 tablet by mouth daily. supplement   12/14/2020    Family History  Problem Relation Age of Onset   Cancer Mother    Heart disease Father    Heart failure Father    Heart murmur Father    Hypertension Sister      Review of Systems:      Cardiac Review of Systems: Y or  [    ]= no  Chest Pain [   x ]  Resting SOB [  x ] Exertional SOB  [ x ]  Orthopnea [  ]   Pedal Edema [   ]    Palpitations [  ] Syncope  [  ]   Presyncope [   ]  General Review of Systems: [Y] = yes [  ]=no Constitional: recent weight change [  ]; anorexia [  ]; fatigue [x  ]; nausea [  ]; night sweats [  ]; fever [  ]; or chills [  ]  Eye : blurred vision [  ]; diplopia [   ]; vision changes [  ];  Amaurosis fugax[  ]; Resp: cough [  ];  wheezing[  ];  hemoptysis[  ]; shortness of breath[ x ]; paroxysmal nocturnal dyspnea[  ]; dyspnea on exertion[ xx ]; or orthopnea[  ];  GI:  gallstones[  ], vomiting[  ];  dysphagia[  ]; melena[  ];   hematochezia [  ]; heartburn[  ];   Hx of  Colonoscopy[  ]; GU: kidney stones [  ]; hematuria[  ];   dysuria [  ];  nocturia[  ];  history of     obstruction [  ]; urinary frequency [  ]             Skin: rash, swelling[  ];, hair loss[  ];  peripheral edema[  ];  or itching[  ]; Musculosketetal: myalgias[  ];  joint swelling[  ];  joint erythema[  ];  joint pain[  ];  back pain[  ];  Heme/Lymph: bruising[  ];  bleeding[  ];  anemia[  ];  Neuro: TIA[  ];  headaches[  ];  stroke[  ];  vertigo[  ];  seizures[  ];   paresthesias[  ];  difficulty walking[  ];  Psych:depression[  ]; anxiety[  ];  Endocrine: diabetes[  ];  thyroid dysfunction[  ];                Physical Exam: BP 118/71 (BP Location: Left Arm)   Pulse (!) 104   Temp 97.6 F (36.4 C) (Oral)   Resp 19   Ht 5\' 8"  (1.727 m)   Wt 79.6 kg   SpO2 92%   BMI 26.68 kg/m    General appearance: alert, cooperative, and mild distress Head: Normocephalic, without obvious abnormality, atraumatic Neck: no adenopathy, no carotid bruit, no JVD, and supple, symmetrical, trachea midline Lymph nodes: No cervical or clavicular adenopathy Resp: Breath sounds are clear and full on the left.  Severely diminished on the right.  No wheezing or rhonchi. Cardio: Irregularly.  Monitor shows sinus rhythm with frequent PACs.  I do not hear a murmur. GI: Soft and nontender, active bowel sounds. Extremities: All are warm and well-perfused with palpable peripheral pulses. Neurologic: Grossly normal  Diagnostic Studies & Laboratory data:     Recent Radiology Findings:   DG Chest 2 View  Result Date: 12/14/2020 CLINICAL DATA:  Chest pain short of breath and cough EXAM: CHEST - 2 VIEW COMPARISON:  None. FINDINGS: Right lower lobe consolidation with associated right pleural effusion. Left lung is clear. Negative for heart failure or edema. IMPRESSION: Right lower lobe airspace disease and right effusion. Possible pneumonia or mass lesion. Followup PA  and lateral chest X-ray is recommended in 3-4 weeks following trial of antibiotic therapy to ensure resolution and exclude underlying malignancy. Electronically Signed   By: Franchot Gallo M.D.   On: 12/14/2020 16:33   CT Angio Chest PE W and/or Wo Contrast  Result Date: 12/15/2020 CLINICAL DATA:  Chest pain. EXAM: CT ANGIOGRAPHY CHEST WITH CONTRAST TECHNIQUE: Multidetector CT imaging of the chest was performed using the standard protocol during bolus administration of intravenous contrast. Multiplanar CT image reconstructions and MIPs were obtained to evaluate the vascular anatomy. CONTRAST:  72mL OMNIPAQUE IOHEXOL 350 MG/ML SOLN COMPARISON:  None. FINDINGS: Cardiovascular: There is limited evaluation of the subsegmental pulmonary arteries secondary to suboptimal opacification with intravenous contrast. Note true intraluminal filling defects are clearly identified.  Normal heart size. No pericardial effusion. Mediastinum/Nodes: There is mild pretracheal right hilar lymphadenopathy. Thyroid gland, trachea, and esophagus demonstrate no significant findings. Lungs/Pleura: Moderate severity atelectasis and/or infiltrate is seen within the posterior aspects of the right upper lobe, right middle lobe and right lower lobe. There is a moderate to large right pleural effusion with loculated components seen along the anterior aspect of the horizontal fissure and right lung base. No pneumothorax is identified. Elevation of the right hemidiaphragm is noted. Upper Abdomen: There is a small hiatal hernia. Musculoskeletal: An ill-defined, mixed lytic and sclerotic appearing area is noted within the T8 vertebral body. A chronic appearing sclerotic band is seen along the anterior aspect of T11 vertebral body. Review of the MIP images confirms the above findings. IMPRESSION: 1. Limited evaluation of the pulmonary arteries, without evidence of pulmonary embolism. 2. Moderate severity posterior right upper lobe, right middle lobe  and right lower lobe atelectasis and/or infiltrate. 3. Moderate to large right pleural effusion with multiple loculated components. 4. Ill-defined, mixed lytic and sclerotic appearing area within the T8 vertebral body. While this area appears to be benign in etiology, correlation with nonemergent follow-up MRI is recommended. 5. Small hiatal hernia. Electronically Signed   By: Virgina Norfolk M.D.   On: 12/15/2020 00:19     I have independently reviewed the above radiologic studies and discussed with the patient   Recent Lab Findings: Lab Results  Component Value Date   WBC 22.1 (H) 12/15/2020   HGB 11.3 (L) 12/15/2020   HCT 36.2 (L) 12/15/2020   PLT 451 (H) 12/15/2020   GLUCOSE 116 (H) 12/15/2020   ALT 65 (H) 12/15/2020   AST 54 (H) 12/15/2020   NA 132 (L) 12/15/2020   K 3.8 12/15/2020   CL 97 (L) 12/15/2020   CREATININE 0.97 12/15/2020   BUN 11 12/15/2020   CO2 23 12/15/2020      Assessment / Plan:      Brett Bean is a very pleasant 75 year old gentleman with no significant past medical or surgical history but who has had persistent cough, fatigue, and shortness of breath of about 2 weeks duration.  He has been treated symptomatically for allergies and for suspected viral infection up until now.  His shortness of breath and fatigue have worsened over the last few days and he also developed fever and a productive cough.  He presented to the emergency room once again yesterday and was found to have leukocytosis with white blood count of 22,000 and fever.  CTA of the chest shows no evidence of PE but he does have a large loculated right pleural effusion.  Images were reviewed by Dr. Roxan Hockey.  Recommendation for first course of action is to proceed with image-guided drainage with pigtail catheter by interventional radiology.  We have requested cultures and gram stain at the time of this procedure.  Would continue with the broad-spectrum antibiotic coverage for now and adjust as  appropriate when cultures are available.  I explained to Mr. Brett Bean and his wife that further intervention may be required including lytic therapy and possibly video-assisted thoracoscopy for formal decortication should percutaneous drainage prove ineffective.  We will continue to follow.    I  spent 25 minutes counseling the patient face to face.  Antony Odea, PA-C  12/15/2020 9:11 AM  Patient seen and examined, agree with assessment and plan as noted above.  75 yo man became ill about 2 weeks ago. Initially worked up for cardiac source but was negative.  Then developed fevers. CT angio and CXR showed a loculated effusion. Also has elevated right hemidiaphragm.  Started in ceftriaxone and azithromycin.  Asked IR to place a drain in effusion for thrombolytics. Procedure not done as only a small amount of fluid.  No images in PACS. I asked Pulmonary to check with ultrasound which they did. Also no drainable collection currently.  He is improving clinically with IV antibiotics.  Will follow. Repeat CXR in Am. May eventually need tube or VATS  Remo Lipps C. Roxan Hockey, MD [facility] [phone]

## 2020-12-15 NOTE — Consult Note (Signed)
Chief Complaint: Patient was seen in consultation today for right loculated effusion pigtail catheter placement Chief Complaint  Patient presents with   Chest Pain   at the request of TCTS  Supervising Physician: Arne Cleveland  Patient Status: [facility] - In-pt  History of Present Illness: Brett Bean is a 75 y.o. male    Former smoker- new onset cough and sob Fatigue and decreased appetite x 2 weeks To [facility] ED Covid neg Treated and back to ED 10/12 to [facility] with worsening sxs and fever CTA : IMPRESSION: 1. Limited evaluation of the pulmonary arteries, without evidence of pulmonary embolism. 2. Moderate severity posterior right upper lobe, right middle lobe and right lower lobe atelectasis and/or infiltrate. 3. Moderate to large right pleural effusion with multiple loculated components. 4. Ill-defined, mixed lytic and sclerotic appearing area within the T8 vertebral body. While this area appears to be benign in etiology, correlation with nonemergent follow-up MRI is recommended. 5. Small hiatal hernia.  TCTS consult: He presented to the emergency room once again yesterday and was found to have leukocytosis with white blood count of 22,000 and fever.  CTA of the chest shows no evidence of PE but he does have a large loculated right pleural effusion.  Images were reviewed by Dr. Roxan Hockey.  Recommendation for first course of action is to proceed with image-guided drainage with pigtail catheter by interventional radiology.  We have requested cultures and gram stain at the time of this procedure.  Would continue with the broad-spectrum antibiotic coverage for now and adjust as appropriate when cultures are available.  I explained to [name] and his wife that further intervention may be required including lytic therapy and possibly video-assisted thoracoscopy for formal decortication should percutaneous drainage prove ineffective.  We will continue to follow  Scheduled now for  Rt chest tube drain  Dr Vernard Gambles has reviewed imaging and approves procedure   Past Medical History:  Diagnosis Date   Hyperlipidemia     Past Surgical History:  Procedure Laterality Date   NO PAST SURGERIES      Allergies: Asa [aspirin]  Medications: Prior to Admission medications   Medication Sig Start Date End Date Taking? Authorizing Provider  cholecalciferol (VITAMIN D) 25 MCG (1000 UNIT) tablet Take 1,000 Units by mouth daily.   Yes [provider]  Misc Natural Products (PROSTATE) CAPS Take 1 capsule by mouth in the morning and at bedtime. supplement   Yes [provider]  Multiple Vitamins-Minerals (EYE VITAMINS PO) Take 1 tablet by mouth daily. supplement   Yes [provider]     Family History  Problem Relation Age of Onset   Cancer Mother    Heart disease Father    Heart failure Father    Heart murmur Father    Hypertension Sister     Social History   Socioeconomic History   Marital status: Married    Spouse name: Not on file   Number of children: Not on file   Years of education: Not on file   Highest education level: Not on file  Occupational History   Not on file  Tobacco Use   Smoking status: Former    Types: Cigarettes   Smokeless tobacco: Never  Substance and Sexual Activity   Alcohol use: Never   Drug use: Never   Sexual activity: Not on file  Other Topics Concern   Not on file  Social History Narrative   Not on file   Social Determinants of Health  Financial Resource Strain: Not on file  Food Insecurity: Not on file  Transportation Needs: Not on file  Physical Activity: Not on file  Stress: Not on file  Social Connections: Not on file    Review of Systems: A 12 point ROS discussed and pertinent positives are indicated in the HPI above.  All other systems are negative.  Review of Systems  Constitutional:  Positive for activity change, fatigue and fever.  Respiratory:  Positive for cough and shortness  of breath.   Cardiovascular:  Positive for chest pain.  Gastrointestinal:  Negative for abdominal pain.  Neurological:  Positive for weakness.  Psychiatric/Behavioral:  Negative for behavioral problems and confusion.    Vital Signs: BP 118/71 (BP Location: Left Arm)   Pulse (!) 104   Temp 97.6 F (36.4 C) (Oral)   Resp 19   Ht 5\' 8"  (1.727 m)   Wt 175 lb 7.8 oz (79.6 kg)   SpO2 92%   BMI 26.68 kg/m   Physical Exam Vitals reviewed.  Cardiovascular:     Rate and Rhythm: Normal rate and regular rhythm.  Pulmonary:     Effort: Pulmonary effort is normal.     Breath sounds: Examination of the right-upper field reveals decreased breath sounds. Examination of the right-middle field reveals decreased breath sounds. Examination of the right-lower field reveals decreased breath sounds. Decreased breath sounds present.  Abdominal:     Palpations: Abdomen is soft.  Musculoskeletal:        General: Normal range of motion.  Skin:    General: Skin is warm.  Neurological:     Mental Status: He is alert and oriented to person, place, and time.  Psychiatric:        Behavior: Behavior normal.    Imaging: DG Chest 2 View  Result Date: 12/14/2020 CLINICAL DATA:  Chest pain short of breath and cough EXAM: CHEST - 2 VIEW COMPARISON:  None. FINDINGS: Right lower lobe consolidation with associated right pleural effusion. Left lung is clear. Negative for heart failure or edema. IMPRESSION: Right lower lobe airspace disease and right effusion. Possible pneumonia or mass lesion. Followup PA and lateral chest X-ray is recommended in 3-4 weeks following trial of antibiotic therapy to ensure resolution and exclude underlying malignancy. Electronically Signed   By: Franchot Gallo M.D.   On: 12/14/2020 16:33   CT Angio Chest PE W and/or Wo Contrast  Result Date: 12/15/2020 CLINICAL DATA:  Chest pain. EXAM: CT ANGIOGRAPHY CHEST WITH CONTRAST TECHNIQUE: Multidetector CT imaging of the chest was performed  using the standard protocol during bolus administration of intravenous contrast. Multiplanar CT image reconstructions and MIPs were obtained to evaluate the vascular anatomy. CONTRAST:  66mL OMNIPAQUE IOHEXOL 350 MG/ML SOLN COMPARISON:  None. FINDINGS: Cardiovascular: There is limited evaluation of the subsegmental pulmonary arteries secondary to suboptimal opacification with intravenous contrast. Note true intraluminal filling defects are clearly identified. Normal heart size. No pericardial effusion. Mediastinum/Nodes: There is mild pretracheal right hilar lymphadenopathy. Thyroid gland, trachea, and esophagus demonstrate no significant findings. Lungs/Pleura: Moderate severity atelectasis and/or infiltrate is seen within the posterior aspects of the right upper lobe, right middle lobe and right lower lobe. There is a moderate to large right pleural effusion with loculated components seen along the anterior aspect of the horizontal fissure and right lung base. No pneumothorax is identified. Elevation of the right hemidiaphragm is noted. Upper Abdomen: There is a small hiatal hernia. Musculoskeletal: An ill-defined, mixed lytic and sclerotic appearing area is noted within the  T8 vertebral body. A chronic appearing sclerotic band is seen along the anterior aspect of T11 vertebral body. Review of the MIP images confirms the above findings. IMPRESSION: 1. Limited evaluation of the pulmonary arteries, without evidence of pulmonary embolism. 2. Moderate severity posterior right upper lobe, right middle lobe and right lower lobe atelectasis and/or infiltrate. 3. Moderate to large right pleural effusion with multiple loculated components. 4. Ill-defined, mixed lytic and sclerotic appearing area within the T8 vertebral body. While this area appears to be benign in etiology, correlation with nonemergent follow-up MRI is recommended. 5. Small hiatal hernia. Electronically Signed   By: Virgina Norfolk M.D.   On: 12/15/2020  00:19   NM Myocar Multi W/Spect W/Wall Motion / EF  Result Date: 12/09/2020   The study is normal. There are no perfusion defects consistent with prior infarct or current ischemia.  The study is low risk.   No ST deviation was noted.   Left ventricular function is normal. Nuclear stress EF: 55 %. The left ventricular ejection fraction is normal (55-65%). End diastolic cavity size is normal.    Labs:  CBC: Recent Labs    12/14/20 1546 12/15/20 0050  WBC 21.8* 22.1*  HGB 10.4* 11.3*  HCT 33.6* 36.2*  PLT 442* 451*    COAGS: No results for input(s): INR, APTT in the last 8760 hours.  BMP: Recent Labs    12/14/20 1546 12/15/20 0050  NA 130* 132*  K 3.7 3.8  CL 93* 97*  CO2 25 23  GLUCOSE 106* 116*  BUN 11 11  CALCIUM 8.2* 8.2*  CREATININE 1.05 0.97  GFRNONAA >60 >60    LIVER FUNCTION TESTS: Recent Labs    12/14/20 1546 12/15/20 0050  BILITOT 1.2 1.2  AST 65* 54*  ALT 68* 65*  ALKPHOS 143* 147*  PROT 6.9 6.9  ALBUMIN 2.2* 2.4*    TUMOR MARKERS: No results for input(s): AFPTM, CEA, CA199, CHROMGRNA in the last 8760 hours.  Assessment and Plan:  Right loculated pleural effusion TCTS requesting Rt chest tube placement Risks and benefits of chest tube drain placement were discussed with the patient including bleeding, infection, damage to adjacent structures, malfunction of the tube requiring additional procedures and sepsis.  All of the patient's questions were answered, patient is agreeable to proceed. Consent signed and in chart.   Thank you for this interesting consult.  I greatly enjoyed meeting MACAI SISNEROS and look forward to participating in their care.  A copy of this report was sent to the requesting provider on this date.  Electronically Signed: Lavonia Drafts, PA-C 12/15/2020, 10:22 AM   I spent a total of 40 Minutes    in face to face in clinical consultation, greater than 50% of which was counseling/coordinating care for right chest tube  placement

## 2020-12-15 NOTE — Progress Notes (Signed)
   12/15/20 0217  Assess: MEWS Score  Temp (!) 102.8 F (39.3 C)  BP (!) 151/92  Pulse Rate (!) 113  ECG Heart Rate (!) 112  Resp (!) 22  Level of Consciousness Alert  SpO2 91 %  O2 Device Nasal Cannula  O2 Flow Rate (L/min) 2 L/min  Assess: MEWS Score  MEWS Temp 2  MEWS Systolic 0  MEWS Pulse 2  MEWS RR 1  MEWS LOC 0  MEWS Score 5  MEWS Score Color Red  Assess: if the MEWS score is Yellow or Red  Were vital signs taken at a resting state? Yes  Focused Assessment No change from prior assessment  Early Detection of Sepsis Score *See Row Information* High  MEWS guidelines implemented *See Row Information* Yes  Take Vital Signs  Increase Vital Sign Frequency  Red: Q 1hr X 4 then Q 4hr X 4, if remains red, continue Q 4hrs  Escalate  MEWS: Escalate Red: discuss with charge nurse/RN and provider, consider discussing with RRT  Notify: Charge Nurse/RN  Name of Charge Nurse/RN Notified Lyda Kalata, RN  Date Charge Nurse/RN Notified 12/15/20  Time Charge Nurse/RN Notified 0217  Document  Patient Outcome Not stable and remains on department  Progress note created (see row info) Yes

## 2020-12-15 NOTE — H&P (Signed)
History and Physical    COLEBY YETT SKA:[id number] DOB: 11-Aug-1945 DOA: 12/14/2020  PCP: Iona Beard, MD  Patient coming from: Home  I have personally briefly reviewed patient's old medical records in [place]  Chief Complaint: CP, cough  HPI: NEZAR BUCKLES is a 75 y.o. male with medical history significant of HLD.  Pt presents to ED with c/o R sided CP, productive cough.  Initially CP onset 1.5 weeks ago, substernal, pressure-like, non radiating.  Over past few days CP migrated to R side of chest.  Cough productive of rusty sputum.  Fever with Tm 101 at home over past week.  Went to cards for CP, had stress test on 10/7 which was normal.  No abd pain, N/V.   ED Course: CTA chest: no PE, has multilobar atelectasis vs infiltrate of R lung, has mod-large loculated pleural effusion of R lung.  Has new O2 requirement.  Has 4/4 SIRS.  BP running as high as 156/74 though so not given sepsis IVF bolus.  Was started on rocephin + azithro.   Review of Systems: As per HPI, otherwise all review of systems negative.  Past Medical History:  Diagnosis Date   Hyperlipidemia     Past Surgical History:  Procedure Laterality Date   NO PAST SURGERIES       reports that he has quit smoking. His smoking use included cigarettes. He has never used smokeless tobacco. He reports that he does not drink alcohol and does not use drugs.  Allergies  Allergen Reactions   Asa [Aspirin]     Family History  Problem Relation Age of Onset   Cancer Mother    Heart disease Father    Heart failure Father    Heart murmur Father    Hypertension Sister      Prior to Admission medications   Medication Sig Start Date End Date Taking? Authorizing Provider  cholecalciferol (VITAMIN D) 25 MCG (1000 UNIT) tablet Take 1,000 Units by mouth daily.   Yes [provider]  Misc Natural Products (PROSTATE) CAPS Take 1 capsule by mouth in the morning and at bedtime. supplement   Yes  [provider]  Multiple Vitamins-Minerals (EYE VITAMINS PO) Take 1 tablet by mouth daily. supplement   Yes [provider]    Physical Exam: Vitals:   12/15/20 0100 12/15/20 0115 12/15/20 0130 12/15/20 0145  BP: (!) 153/81 (!) 139/126 (!) 141/83 133/82  Pulse: (!) 119 (!) 109 (!) 108 (!) 106  Resp:   (!) 28 (!) 25  Temp:      TempSrc:      SpO2: 91% 96% 95% 94%    Constitutional: NAD, calm, comfortable Eyes: PERRL, lids and conjunctivae normal ENMT: Mucous membranes are moist. Posterior pharynx clear of any exudate or lesions.Normal dentition.  Neck: normal, supple, no masses, no thyromegaly Respiratory: Tachypnea present  Cardiovascular: Tachycardic Abdomen: no tenderness, no masses palpated. No hepatosplenomegaly. Bowel sounds positive.  Musculoskeletal: no clubbing / cyanosis. No joint deformity upper and lower extremities. Good ROM, no contractures. Normal muscle tone.  Skin: no rashes, lesions, ulcers. No induration Neurologic: CN 2-12 grossly intact. Sensation intact, DTR normal. Strength 5/5 in all 4.  Psychiatric: Normal judgment and insight. Alert and oriented x 3. Normal mood.    Labs on Admission: I have personally reviewed following labs and imaging studies  CBC: Recent Labs  Lab 12/14/20 1546 12/15/20 0050  WBC 21.8* 22.1*  NEUTROABS 17.6*  --   HGB 10.4* 11.3*  HCT 33.6* 36.2*  MCV 93.1 93.5  PLT 442* 875*   Basic Metabolic Panel: Recent Labs  Lab 12/14/20 1546 12/15/20 0050  NA 130* 132*  K 3.7 3.8  CL 93* 97*  CO2 25 23  GLUCOSE 106* 116*  BUN 11 11  CREATININE 1.05 0.97  CALCIUM 8.2* 8.2*   GFR: Estimated Creatinine Clearance: 66.9 mL/min (by C-G formula based on SCr of 0.97 mg/dL). Liver Function Tests: Recent Labs  Lab 12/14/20 1546 12/15/20 0050  AST 65* 54*  ALT 68* 65*  ALKPHOS 143* 147*  BILITOT 1.2 1.2  PROT 6.9 6.9  ALBUMIN 2.2* 2.4*   No results for input(s): LIPASE, AMYLASE in the last 168 hours. No  results for input(s): AMMONIA in the last 168 hours. Coagulation Profile: No results for input(s): INR, PROTIME in the last 168 hours. Cardiac Enzymes: No results for input(s): CKTOTAL, CKMB, CKMBINDEX, TROPONINI in the last 168 hours. BNP (last 3 results) No results for input(s): PROBNP in the last 8760 hours. HbA1C: No results for input(s): HGBA1C in the last 72 hours. CBG: No results for input(s): GLUCAP in the last 168 hours. Lipid Profile: No results for input(s): CHOL, HDL, LDLCALC, TRIG, CHOLHDL, LDLDIRECT in the last 72 hours. Thyroid Function Tests: No results for input(s): TSH, T4TOTAL, FREET4, T3FREE, THYROIDAB in the last 72 hours. Anemia Panel: No results for input(s): VITAMINB12, FOLATE, FERRITIN, TIBC, IRON, RETICCTPCT in the last 72 hours. Urine analysis: No results found for: COLORURINE, APPEARANCEUR, LABSPEC, Bent, GLUCOSEU, HGBUR, BILIRUBINUR, KETONESUR, PROTEINUR, UROBILINOGEN, NITRITE, LEUKOCYTESUR  Radiological Exams on Admission: DG Chest 2 View  Result Date: 12/14/2020 CLINICAL DATA:  Chest pain short of breath and cough EXAM: CHEST - 2 VIEW COMPARISON:  None. FINDINGS: Right lower lobe consolidation with associated right pleural effusion. Left lung is clear. Negative for heart failure or edema. IMPRESSION: Right lower lobe airspace disease and right effusion. Possible pneumonia or mass lesion. Followup PA and lateral chest X-ray is recommended in 3-4 weeks following trial of antibiotic therapy to ensure resolution and exclude underlying malignancy. Electronically Signed   By: Franchot Gallo M.D.   On: 12/14/2020 16:33   CT Angio Chest PE W and/or Wo Contrast  Result Date: 12/15/2020 CLINICAL DATA:  Chest pain. EXAM: CT ANGIOGRAPHY CHEST WITH CONTRAST TECHNIQUE: Multidetector CT imaging of the chest was performed using the standard protocol during bolus administration of intravenous contrast. Multiplanar CT image reconstructions and MIPs were obtained to  evaluate the vascular anatomy. CONTRAST:  78mL OMNIPAQUE IOHEXOL 350 MG/ML SOLN COMPARISON:  None. FINDINGS: Cardiovascular: There is limited evaluation of the subsegmental pulmonary arteries secondary to suboptimal opacification with intravenous contrast. Note true intraluminal filling defects are clearly identified. Normal heart size. No pericardial effusion. Mediastinum/Nodes: There is mild pretracheal right hilar lymphadenopathy. Thyroid gland, trachea, and esophagus demonstrate no significant findings. Lungs/Pleura: Moderate severity atelectasis and/or infiltrate is seen within the posterior aspects of the right upper lobe, right middle lobe and right lower lobe. There is a moderate to large right pleural effusion with loculated components seen along the anterior aspect of the horizontal fissure and right lung base. No pneumothorax is identified. Elevation of the right hemidiaphragm is noted. Upper Abdomen: There is a small hiatal hernia. Musculoskeletal: An ill-defined, mixed lytic and sclerotic appearing area is noted within the T8 vertebral body. A chronic appearing sclerotic band is seen along the anterior aspect of T11 vertebral body. Review of the MIP images confirms the above findings. IMPRESSION: 1. Limited evaluation of the pulmonary  arteries, without evidence of pulmonary embolism. 2. Moderate severity posterior right upper lobe, right middle lobe and right lower lobe atelectasis and/or infiltrate. 3. Moderate to large right pleural effusion with multiple loculated components. 4. Ill-defined, mixed lytic and sclerotic appearing area within the T8 vertebral body. While this area appears to be benign in etiology, correlation with nonemergent follow-up MRI is recommended. 5. Small hiatal hernia. Electronically Signed   By: Virgina Norfolk M.D.   On: 12/15/2020 00:19    EKG: Independently reviewed.  Assessment/Plan Principal Problem:   CAP (community acquired pneumonia) Active Problems:   Acute  respiratory failure with hypoxia (HCC)   Pleural effusion    CAP - PNA pathway Rocephin + azithro Does have 4/4 SIRS but BP running on the high side (as high as 156/74 so pt not given sepsis IVF bolus Will put on LR at 100 BCx and sputum Cx pending Check MRSA PCR nares (add vanc if positive) Call CVTS in AM to see how they want to proceed with regards to the loculated pleural effusion / possible empyema. Acute resp failure with hypoxia - Tachypnea + new O2 requirement as he was sating 85 on RA. O2 via  Cont pulse ox  DVT prophylaxis: Lovenox (start at 10am) Code Status: Full Family Communication: Family at bedside Disposition Plan: Home after respiratory status improved, cleared by CVTS Consults called: None Admission status: Admit to inpatient  Severity of Illness: The appropriate patient status for this patient is INPATIENT. Inpatient status is judged to be reasonable and necessary in order to provide the required intensity of service to ensure the patient's safety. The patient's presenting symptoms, physical exam findings, and initial radiographic and laboratory data in the context of their chronic comorbidities is felt to place them at high risk for further clinical deterioration. Furthermore, it is not anticipated that the patient will be medically stable for discharge from the hospital within 2 midnights of admission. The following factors support the patient status of inpatient.   Patient has acute respiratory failure with hypoxia due to having a new oxygen requirement.  That is the patient has a PaO2 < 60 (pulse Ox < 90%) on room air.  PNA complicated by para pneumonic effusion / possible empyema.  Also new O2 requirement.   * I certify that at the point of admission it is my clinical judgment that the patient will require inpatient hospital care spanning beyond 2 midnights from the point of admission due to high intensity of service, high risk for further deterioration and  high frequency of surveillance required.*   Everard Interrante M. DO [facility]  How to contact the [facility] Attending or Consulting provider [place] or covering provider during after hours [place], for this patient?  Check the care team in [facility] and look for a) attending/consulting [facility] provider listed and b) the [facility] team listed Log into [email or web address]  Amion Physician Scheduling and messaging for groups and whole hospitals  On call and physician scheduling software for group practices, residents, hospitalists and other medical providers for call, clinic, rotation and shift schedules. On[employer] is a hospital-wide system for scheduling doctors and paging doctors on call. EasyPlot is for scientific plotting and data analysis.  [email or web address]  and use [place] universal password to access. If you do not have the password, please contact the hospital operator.  Locate the [facility] provider you are looking for under [facility] and page to a number that you can be directly reached. If you still  have difficulty reaching the provider, please page the [facility] (Director on Call) for the Hospitalists listed on amion for assistance.  12/15/2020, 2:16 AM

## 2020-12-15 NOTE — Procedures (Signed)
  Procedure: CT chest in contemplation of chest drain; small free flowing effusion encountered; drain placement cancelled EBL:   minimal Complications:  none immediate  See full dictation in [employer].  Dillard Cannon MD Main # [phone] Pager  [phone]

## 2020-12-15 NOTE — ED Notes (Signed)
Pt placed on 2L  due to oxygen saturation 85% RA.

## 2020-12-15 NOTE — Sedation Documentation (Signed)
Pt awaiting transport back to 2C02.

## 2020-12-15 NOTE — Consult Note (Signed)
Name: Brett Bean MRN: [id number] DOB: 1945-10-04    ADMISSION DATE:  12/14/2020 CONSULTATION DATE:  12/15/2020   REFERRING MD :  Marlene Bast  CHIEF COMPLAINT: Empyema, fever, cough   HISTORY OF PRESENT ILLNESS: 75 year old remote smoker, vaccinated, presented with urinary symptoms initially to CP, was given Mucinex, then went to urgent care, was given prednisone taper and also complained of substernal chest pain radiating to right.  Underwent cardiac stress testing which was low risk. Return to the ED with fever 101, right-sided chest pain, cough productive rusty sputum. CT angiogram chest showed infiltrate in right lung with moderate right loculated pleural effusion.  He was started on Rocephin and azithromycin.  CT surgery was consulted for empyema, they recommended CT guided chest tube drainage. Repeat CT chest in contemplation of chest tube showed small free-flowing effusion and drain placement was canceled, hence pulmonary consulted. He states that he is 50% improved, cough is now mostly dry, chest pain is almost resolved, saturating 95% on room air, afebrile last 24 hours Wife and daughter present in the room who corroborates history.   PAST MEDICAL HISTORY :  Past Medical History:  Diagnosis Date   Hyperlipidemia    Past Surgical History:  Procedure Laterality Date   NO PAST SURGERIES     Prior to Admission medications   Medication Sig Start Date End Date Taking? Authorizing Provider  cholecalciferol (VITAMIN D) 25 MCG (1000 UNIT) tablet Take 1,000 Units by mouth daily.   Yes [provider]  Misc Natural Products (PROSTATE) CAPS Take 1 capsule by mouth in the morning and at bedtime. supplement   Yes [provider]  Multiple Vitamins-Minerals (EYE VITAMINS PO) Take 1 tablet by mouth daily. supplement   Yes [provider]   Allergies  Allergen Reactions   Asa [Aspirin]     FAMILY HISTORY:  Family History  Problem Relation Age  of Onset   Cancer Mother    Heart disease Father    Heart failure Father    Heart murmur Father    Hypertension Sister    SOCIAL HISTORY:  reports that he has quit smoking. His smoking use included cigarettes. He has never used smokeless tobacco. He reports that he does not drink alcohol and does not use drugs.  REVIEW OF SYSTEMS:   Fever, cough productive yellow sputum Shortness of breath Substernal chest pain  Constitutional: Negative for fever, chills, weight loss, malaise/fatigue and diaphoresis.  HENT: Negative for hearing loss, ear pain, nosebleeds, congestion, sore throat, neck pain, tinnitus and ear discharge.   Eyes: Negative for blurred vision, double vision, photophobia, pain, discharge and redness.  Respiratory: Negative for hemoptysis, wheezing and stridor.   Cardiovascular: Negative for  palpitations, orthopnea, claudication, leg swelling and PND.  Gastrointestinal: Negative for heartburn, nausea, vomiting, abdominal pain, diarrhea, constipation, blood in stool and melena.  Genitourinary: Negative for dysuria, urgency, frequency, hematuria and flank pain.  Musculoskeletal: Negative for myalgias, back pain, joint pain and falls.  Skin: Negative for itching and rash.  Neurological: Negative for dizziness, tingling, tremors, sensory change, speech change, focal weakness, seizures, loss of consciousness, weakness and headaches.  Endo/Heme/Allergies: Negative for environmental allergies and polydipsia. Does not bruise/bleed easily.  SUBJECTIVE:   VITAL SIGNS: Temp:  [97.4 F (36.3 C)-102.8 F (39.3 C)] 99.4 F (37.4 C) (10/13 1546) Pulse Rate:  [79-119] 97 (10/13 1546) Resp:  [14-31] 20 (10/13 1546) BP: (93-153)/(54-126) 112/66 (10/13 1546) SpO2:  [91 %-99 %] 91 % (10/13 1546) Weight:  [79.6  kg] 79.6 kg (10/13 0217)  PHYSICAL EXAMINATION: Gen. Pleasant, well-nourished, in no distress, normal affect ENT - no lesions, no post nasal drip Neck: No JVD, no thyromegaly,  no carotid bruits Lungs: no use of accessory muscles, no dullness to percussion, right basal crackles, decreased breath sounds on right Cardiovascular: Rhythm regular, heart sounds  normal, no murmurs, no peripheral edema Abdomen: soft and non-tender, no hepatosplenomegaly, BS normal. Musculoskeletal: No deformities, no cyanosis or clubbing Neuro:  alert, non focal Skin:  Warm, no lesions/ rash   Recent Labs  Lab 12/14/20 1546 12/15/20 0050  NA 130* 132*  K 3.7 3.8  CL 93* 97*  CO2 25 23  BUN 11 11  CREATININE 1.05 0.97  GLUCOSE 106* 116*   Recent Labs  Lab 12/14/20 1546 12/15/20 0050  HGB 10.4* 11.3*  HCT 33.6* 36.2*  WBC 21.8* 22.1*  PLT 442* 451*   DG Chest 2 View  Result Date: 12/14/2020 CLINICAL DATA:  Chest pain short of breath and cough EXAM: CHEST - 2 VIEW COMPARISON:  None. FINDINGS: Right lower lobe consolidation with associated right pleural effusion. Left lung is clear. Negative for heart failure or edema. IMPRESSION: Right lower lobe airspace disease and right effusion. Possible pneumonia or mass lesion. Followup PA and lateral chest X-ray is recommended in 3-4 weeks following trial of antibiotic therapy to ensure resolution and exclude underlying malignancy. Electronically Signed   By: Franchot Gallo M.D.   On: 12/14/2020 16:33   CT Angio Chest PE W and/or Wo Contrast  Result Date: 12/15/2020 CLINICAL DATA:  Chest pain. EXAM: CT ANGIOGRAPHY CHEST WITH CONTRAST TECHNIQUE: Multidetector CT imaging of the chest was performed using the standard protocol during bolus administration of intravenous contrast. Multiplanar CT image reconstructions and MIPs were obtained to evaluate the vascular anatomy. CONTRAST:  83mL OMNIPAQUE IOHEXOL 350 MG/ML SOLN COMPARISON:  None. FINDINGS: Cardiovascular: There is limited evaluation of the subsegmental pulmonary arteries secondary to suboptimal opacification with intravenous contrast. Note true intraluminal filling defects are clearly  identified. Normal heart size. No pericardial effusion. Mediastinum/Nodes: There is mild pretracheal right hilar lymphadenopathy. Thyroid gland, trachea, and esophagus demonstrate no significant findings. Lungs/Pleura: Moderate severity atelectasis and/or infiltrate is seen within the posterior aspects of the right upper lobe, right middle lobe and right lower lobe. There is a moderate to large right pleural effusion with loculated components seen along the anterior aspect of the horizontal fissure and right lung base. No pneumothorax is identified. Elevation of the right hemidiaphragm is noted. Upper Abdomen: There is a small hiatal hernia. Musculoskeletal: An ill-defined, mixed lytic and sclerotic appearing area is noted within the T8 vertebral body. A chronic appearing sclerotic band is seen along the anterior aspect of T11 vertebral body. Review of the MIP images confirms the above findings. IMPRESSION: 1. Limited evaluation of the pulmonary arteries, without evidence of pulmonary embolism. 2. Moderate severity posterior right upper lobe, right middle lobe and right lower lobe atelectasis and/or infiltrate. 3. Moderate to large right pleural effusion with multiple loculated components. 4. Ill-defined, mixed lytic and sclerotic appearing area within the T8 vertebral body. While this area appears to be benign in etiology, correlation with nonemergent follow-up MRI is recommended. 5. Small hiatal hernia. Electronically Signed   By: Virgina Norfolk M.D.   On: 12/15/2020 00:19    ASSESSMENT / PLAN:  Right lower lobe community-acquired pneumonia. Right loculated empyema  -Unfortunately CT chest only showed small loculated effusion. -Bedside ultrasound was performed which showed small pocket of loculated  effusion in the infrascapular window -Appears clinically improved.     Recommend -Discussed with CT surgery -Can take a wait and watch approach year since he is clinically improved, follow with daily  chest x-ray and CBC -If effusion increases can consider drainage, IR guided versus surgical    Kara Mead MD. FCCP. [facility] Pager [phone] If no response call [phone]    12/15/2020, 6:06 PM

## 2020-12-15 NOTE — Sedation Documentation (Signed)
Dr. Vernard Gambles at bedside to discuss case with pt.

## 2020-12-16 ENCOUNTER — Inpatient Hospital Stay (HOSPITAL_COMMUNITY): Payer: Medicare Other

## 2020-12-16 DIAGNOSIS — J90 Pleural effusion, not elsewhere classified: Secondary | ICD-10-CM

## 2020-12-16 DIAGNOSIS — J9601 Acute respiratory failure with hypoxia: Secondary | ICD-10-CM | POA: Diagnosis not present

## 2020-12-16 DIAGNOSIS — J189 Pneumonia, unspecified organism: Secondary | ICD-10-CM | POA: Diagnosis not present

## 2020-12-16 LAB — BASIC METABOLIC PANEL
Anion gap: 8 (ref 5–15)
BUN: 11 mg/dL (ref 8–23)
CO2: 27 mmol/L (ref 22–32)
Calcium: 8 mg/dL — ABNORMAL LOW (ref 8.9–10.3)
Chloride: 99 mmol/L (ref 98–111)
Creatinine, Ser: 1.01 mg/dL (ref 0.61–1.24)
GFR, Estimated: >60 mL/min (ref >60)
Glucose, Bld: 110 mg/dL — ABNORMAL HIGH (ref 70–99)
Potassium: 4.3 mmol/L (ref 3.5–5.1)
Sodium: 134 mmol/L — ABNORMAL LOW (ref 135–145)

## 2020-12-16 LAB — CBC
HCT: 32.2 % — ABNORMAL LOW (ref 39.0–52.0)
Hemoglobin: 10.4 g/dL — ABNORMAL LOW (ref 13.0–17.0)
MCH: 29.5 pg (ref 26.0–34.0)
MCHC: 32.3 g/dL (ref 30.0–36.0)
MCV: 91.2 fL (ref 80.0–100.0)
Platelets: 431 10*3/uL — ABNORMAL HIGH (ref 150–400)
RBC: 3.53 MIL/uL — ABNORMAL LOW (ref 4.22–5.81)
RDW: 13.6 % (ref 11.5–15.5)
WBC: 20.6 10*3/uL — ABNORMAL HIGH (ref 4.0–10.5)
nRBC: 0 % (ref 0.0–0.2)

## 2020-12-16 MED ORDER — NAPROXEN 250 MG PO TABS
250.0000 mg | ORAL_TABLET | Freq: Two times a day (BID) | ORAL | Status: AC
  Administered 2020-12-16: 250 mg via ORAL
  Filled 2020-12-16 (×2): qty 1

## 2020-12-16 NOTE — Progress Notes (Signed)
Pt ambulated on hallway fine on RA without assistance.  Sats remained 90-95% on RA.  Idolina Primer, RN

## 2020-12-16 NOTE — Consult Note (Signed)
Name: Brett Bean MRN: [id number] DOB: 16-Mar-1945    ADMISSION DATE:  12/14/2020 CONSULTATION DATE:  12/16/2020   REFERRING MD :  Marlene Bast  CHIEF COMPLAINT: Empyema, fever, cough   HISTORY OF PRESENT ILLNESS: 75 year old remote smoker, vaccinated, presented with urinary symptoms initially to CP, was given Mucinex, then went to urgent care, was given prednisone taper and also complained of substernal chest pain radiating to right.  Underwent cardiac stress testing which was low risk. Return to the ED with fever 101, right-sided chest pain, cough productive rusty sputum. CT angiogram chest showed infiltrate in right lung with moderate right loculated pleural effusion.  He was started on Rocephin and azithromycin.  CT surgery was consulted for empyema, they recommended CT guided chest tube drainage. Repeat CT chest in contemplation of chest tube showed small free-flowing effusion and drain placement was canceled, hence pulmonary consulted. He states that he is 50% improved, cough is now mostly dry, chest pain is almost resolved, saturating 95% on room air, afebrile last 24 hours Wife and daughter present in the room who corroborates history.   PAST MEDICAL HISTORY :  Past Medical History:  Diagnosis Date   Hyperlipidemia    Past Surgical History:  Procedure Laterality Date   NO PAST SURGERIES     Prior to Admission medications   Medication Sig Start Date End Date Taking? Authorizing Provider  cholecalciferol (VITAMIN D) 25 MCG (1000 UNIT) tablet Take 1,000 Units by mouth daily.   Yes [provider]  Misc Natural Products (PROSTATE) CAPS Take 1 capsule by mouth in the morning and at bedtime. supplement   Yes [provider]  Multiple Vitamins-Minerals (EYE VITAMINS PO) Take 1 tablet by mouth daily. supplement   Yes [provider]   Allergies  Allergen Reactions   Asa [Aspirin]     FAMILY HISTORY:  Family History  Problem Relation Age  of Onset   Cancer Mother    Heart disease Father    Heart failure Father    Heart murmur Father    Hypertension Sister    SOCIAL HISTORY:  reports that he has quit smoking. His smoking use included cigarettes. He has never used smokeless tobacco. He reports that he does not drink alcohol and does not use drugs.  SUBJECTIVE:   Feels much better today  WBC down to 20.6 Tmax 102.1 Continues on rocephin azithro No growth on Bcx so far   VITAL SIGNS: Temp:  [97.8 F (36.6 C)-102.1 F (38.9 C)] 98.2 F (36.8 C) (10/14 0742) Pulse Rate:  [79-106] 88 (10/14 0742) Resp:  [17-30] 17 (10/14 0742) BP: (93-148)/(56-88) 99/88 (10/14 0742) SpO2:  [91 %-98 %] 93 % (10/14 0742)  PHYSICAL EXAMINATION: Gen- WDWN pleasant older adult M HEENT - NCAT pink mm anicteric sclera  Lungs: even unlabored no accessory use  Cardiovascular: rr s1s2 cap refill <3sec Abdomen: soft ndnt normoactive x4 Musculoskeletal: no acute deformity no cyanosis or clubbing  Neuro:  AAOx3 following commands  Skin:  c/d/w   Recent Labs  Lab 12/14/20 1546 12/15/20 0050 12/16/20 0028  NA 130* 132* 134*  K 3.7 3.8 4.3  CL 93* 97* 99  CO2 25 23 27   BUN 11 11 11   CREATININE 1.05 0.97 1.01  GLUCOSE 106* 116* 110*   Recent Labs  Lab 12/14/20 1546 12/15/20 0050 12/16/20 0028  HGB 10.4* 11.3* 10.4*  HCT 33.6* 36.2* 32.2*  WBC 21.8* 22.1* 20.6*  PLT 442* 451* 431*   DG Chest 2 View  Result Date: 12/14/2020 CLINICAL DATA:  Chest pain short of breath and cough EXAM: CHEST - 2 VIEW COMPARISON:  None. FINDINGS: Right lower lobe consolidation with associated right pleural effusion. Left lung is clear. Negative for heart failure or edema. IMPRESSION: Right lower lobe airspace disease and right effusion. Possible pneumonia or mass lesion. Followup PA and lateral chest X-ray is recommended in 3-4 weeks following trial of antibiotic therapy to ensure resolution and exclude underlying malignancy. Electronically Signed    By: Franchot Gallo M.D.   On: 12/14/2020 16:33   CT Angio Chest PE W and/or Wo Contrast  Result Date: 12/15/2020 CLINICAL DATA:  Chest pain. EXAM: CT ANGIOGRAPHY CHEST WITH CONTRAST TECHNIQUE: Multidetector CT imaging of the chest was performed using the standard protocol during bolus administration of intravenous contrast. Multiplanar CT image reconstructions and MIPs were obtained to evaluate the vascular anatomy. CONTRAST:  3mL OMNIPAQUE IOHEXOL 350 MG/ML SOLN COMPARISON:  None. FINDINGS: Cardiovascular: There is limited evaluation of the subsegmental pulmonary arteries secondary to suboptimal opacification with intravenous contrast. Note true intraluminal filling defects are clearly identified. Normal heart size. No pericardial effusion. Mediastinum/Nodes: There is mild pretracheal right hilar lymphadenopathy. Thyroid gland, trachea, and esophagus demonstrate no significant findings. Lungs/Pleura: Moderate severity atelectasis and/or infiltrate is seen within the posterior aspects of the right upper lobe, right middle lobe and right lower lobe. There is a moderate to large right pleural effusion with loculated components seen along the anterior aspect of the horizontal fissure and right lung base. No pneumothorax is identified. Elevation of the right hemidiaphragm is noted. Upper Abdomen: There is a small hiatal hernia. Musculoskeletal: An ill-defined, mixed lytic and sclerotic appearing area is noted within the T8 vertebral body. A chronic appearing sclerotic band is seen along the anterior aspect of T11 vertebral body. Review of the MIP images confirms the above findings. IMPRESSION: 1. Limited evaluation of the pulmonary arteries, without evidence of pulmonary embolism. 2. Moderate severity posterior right upper lobe, right middle lobe and right lower lobe atelectasis and/or infiltrate. 3. Moderate to large right pleural effusion with multiple loculated components. 4. Ill-defined, mixed lytic and  sclerotic appearing area within the T8 vertebral body. While this area appears to be benign in etiology, correlation with nonemergent follow-up MRI is recommended. 5. Small hiatal hernia. Electronically Signed   By: Virgina Norfolk M.D.   On: 12/15/2020 00:19   DG Chest Port 1 View  Result Date: 12/16/2020 CLINICAL DATA:  Shortness of breath, pleural effusion EXAM: PORTABLE CHEST 1 VIEW COMPARISON:  12/14/2020 FINDINGS: No significant change in AP portable chest radiograph, with a moderate right pleural associated atelectasis consolidation. The left lung is normally aerated. Cardiomegaly. IMPRESSION: No significant change in AP portable chest radiograph, with a moderate right pleural associated atelectasis and consolidation. The left lung is normally aerated. Electronically Signed   By: Delanna Ahmadi M.D.   On: 12/16/2020 09:13    ASSESSMENT / PLAN:   RLL CAP R sided loculated empyema -pocket looked too small for IR or PCCM -10/14 cxr doesn't look significantly worse  P -trend CXR, white count fever curve  -if effusion incr in size -- consider drainage (IR v surgical)   Anticipate PCCM may be able to sign off at this point, will d/w PCCM MD   Eliseo Gum MSN, AGACNP-BC [place] for pager  12/16/2020, 10:28 AM

## 2020-12-16 NOTE — Progress Notes (Addendum)
      [address]       [place] [id number]             [phone]           Subjective: Patient states breathing is good. He just finished eating breakfast.  Objective: Vital signs in last 24 hours: Temp:  [97.8 F (36.6 C)-102.1 F (38.9 C)] 98.2 F (36.8 C) (10/14 0354) Pulse Rate:  [79-106] 100 (10/14 0253) Cardiac Rhythm: Sinus tachycardia (10/13 1903) Resp:  [20-30] 20 (10/14 0253) BP: (93-148)/(56-82) 118/72 (10/14 0253) SpO2:  [91 %-98 %] 94 % (10/14 0253)     Intake/Output from previous day: 10/13 0701 - 10/14 0700 In: 480 [P.O.:480] Out: 425 [Urine:425]   Physical Exam:  Cardiovascular: IRRR Pulmonary: Clear to auscultation on the left and diminished on the right Abdomen: Soft, non tender, bowel sounds present. Extremities: No lower extremity edema. Wounds: Clean and dry.  No erythema or signs of infection.   Lab Results: CBC: Recent Labs    12/15/20 0050 12/16/20 0028  WBC 22.1* 20.6*  HGB 11.3* 10.4*  HCT 36.2* 32.2*  PLT 451* 431*   BMET:  Recent Labs    12/15/20 0050 12/16/20 0028  NA 132* 134*  K 3.8 4.3  CL 97* 99  CO2 23 27  GLUCOSE 116* 110*  BUN 11 11  CREATININE 0.97 1.01  CALCIUM 8.2* 8.0*    PT/INR:  Recent Labs    12/15/20 1111  LABPROT 16.7*  INR 1.4*   ABG:  INR: Will add last result for INR, ABG once components are confirmed Will add last 4 CBG results once components are confirmed  Assessment/Plan:  1. CV - SR with HR in the 80's. 2.  Pulmonary - On 2 liters of oxygen via [place]. Wean as able. Patient with loculated right pleural effusion, elevated right hemi diaphragm. Per IR note yesterday, small free flowing effusion so no CT placed. Pulmonary also evaluated with [place] to confirm. If right pleural effusion increases, could consider IR guided vs thoracic surgery. 3. ID-on Ceftriaxone and Azithryomycin. Leukocytosis-WBC decreased to 20,600. Hopefully, continues to improve with antibiotics and  will not need thoracic surgery. 4. Anemia-H and H this am decreased to 10.4 and 32.2   Donielle M ZimmermanPA-C 12/16/2020,7:13 AM [phone]   Patient seen and examined, agree with above Clinically improving CXR unchanged to slightly improved No intervention for now, continue to follow  [place]. Roxan Hockey, MD [facility] [phone]

## 2020-12-16 NOTE — Progress Notes (Signed)
PROGRESS NOTE  Brett Bean    DOB: 03-14-1945, 75 y.o.  BHA:[id number]  PCP: Iona Beard, MD   Code Status: Full Code   DOA: 12/14/2020   LOS: 1  Brief Narrative of Current Hospitalization  Brett Bean is a 75 y.o. male with a PMH significant for HLD. They presented from home to the ED on 12/14/2020 with CO, SOB x about 10 days. In the ED, it was found that they had right-sided pneumonia and effusion. They were treated with IV antibiotics.  CVTS was consulted for possible chest tube.  Patient was admitted to medicine service for further workup and management of pleural effusion and pneumonia as outlined in detail below.  12/16/20 -stable, improved  Assessment & Plan  Principal Problem:   CAP (community acquired pneumonia) Active Problems:   Acute respiratory failure with hypoxia (HCC)   Pleural effusion  CAP with right sided pleural effusion with hypoxemia. Febrile this am to 102.1. remains stable on 2L O2 and denies discomfort.  - continue IV Abx  - CTX, Azithromycin (10/12- ) - chest xray am to monitor progression - PRN oxygen to maintain O2 saturation >90% - incentive spirometer - continuous pulse ox - OOB - tylenol/naproxen alternating for fever -CVTS, CCM, IR have been consulted and are following  DVT prophylaxis: enoxaparin (LOVENOX) injection 40 mg Start: 12/16/20 1000   Diet:  Diet Orders (From admission, onward)     Start     Ordered   12/15/20 0904  Diet Heart Room service appropriate? Yes; Fluid consistency: Thin  Diet effective now       Question Answer Comment  Room service appropriate? Yes   Fluid consistency: Thin      12/15/20 0904            Subjective 12/16/20    Pt reports feeling comfortable this morning with his respiratory status.  Denies any pain.  He would like to get out of bed today and walk around if possible.  Disposition Plan & Communication  Status is: Inpatient  Remains inpatient appropriate because: Severity of illness  requires inpatient care Family Communication: Wife at bedside  Consults, Procedures, Significant Events  Consultants:  IR, CCM, CVTS, ID  Procedures/significant events:  None  Antimicrobials:  Anti-infectives (From admission, onward)    Start     Dose/Rate Route Frequency Ordered Stop   12/15/20 2200  azithromycin (ZITHROMAX) 500 mg in sodium chloride 0.9 % 250 mL IVPB        500 mg 250 mL/hr over 60 Minutes Intravenous Every 24 hours 12/15/20 0045 12/20/20 2159   12/15/20 2000  cefTRIAXone (ROCEPHIN) 2 g in sodium chloride 0.9 % 100 mL IVPB        2 g 200 mL/hr over 30 Minutes Intravenous Every 24 hours 12/15/20 0045 12/20/20 1959   12/14/20 2045  cefTRIAXone (ROCEPHIN) 2 g in sodium chloride 0.9 % 100 mL IVPB        2 g 200 mL/hr over 30 Minutes Intravenous  Once 12/14/20 2043 12/14/20 2247   12/14/20 2045  azithromycin (ZITHROMAX) 500 mg in sodium chloride 0.9 % 250 mL IVPB        500 mg 250 mL/hr over 60 Minutes Intravenous  Once 12/14/20 2043 12/15/20 0011        Objective   Vitals:   12/15/20 1917 12/15/20 2300 12/16/20 0253 12/16/20 0354  BP: 112/82 113/72 118/72   Pulse: 92 92 100   Resp: 20 20 20    Temp: 98.6  F (37 C) 99.6 F (37.6 C) (!) 102.1 F (38.9 C) 98.2 F (36.8 C)  TempSrc: Oral Oral Oral Oral  SpO2: 95% 94% 94%   Weight:      Height:        Intake/Output Summary (Last 24 hours) at 12/16/2020 0732 Last data filed at 12/16/2020 0254 Gross per 24 hour  Intake 480 ml  Output 425 ml  Net 55 ml   Filed Weights   12/15/20 0217  Weight: 79.6 kg    Patient BMI: Body mass index is 26.68 kg/m.   Physical Exam: General: awake, alert, NAD HEENT: atraumatic, clear conjunctiva, anicteric sclera, moist mucus membranes, hearing grossly normal Respiratory: no wheezes, rales or rhonchi, normal respiratory effort. Cardiovascular: normal S1/S2,  RRR, no JVD, murmurs, rubs, gallops, quick capillary refill  Gastrointestinal: soft, NT, ND, no HSM  felt Nervous: A&O x4. no gross focal neurologic deficits, normal speech Extremities: moves all equally, no edema, normal tone Skin: dry, intact, normal temperature, normal color, No rashes, lesions or ulcers Psychiatry: normal mood, congruent affect  Labs   I have personally reviewed following labs and imaging studies Admission on 12/14/2020  Component Date Value Ref Range Status   Troponin I (High Sensitivity) 12/14/2020 73 (A) <18 ng/L Final   Sodium 12/14/2020 130 (A) 135 - 145 mmol/L Final   Potassium 12/14/2020 3.7  3.5 - 5.1 mmol/L Final   Chloride 12/14/2020 93 (A) 98 - 111 mmol/L Final   CO2 12/14/2020 25  22 - 32 mmol/L Final   Glucose, Bld 12/14/2020 106 (A) 70 - 99 mg/dL Final   BUN 12/14/2020 11  8 - 23 mg/dL Final   Creatinine, Ser 12/14/2020 1.05  0.61 - 1.24 mg/dL Final   Calcium 12/14/2020 8.2 (A) 8.9 - 10.3 mg/dL Final   Total Protein 12/14/2020 6.9  6.5 - 8.1 g/dL Final   Albumin 12/14/2020 2.2 (A) 3.5 - 5.0 g/dL Final   AST 12/14/2020 65 (A) 15 - 41 U/L Final   ALT 12/14/2020 68 (A) 0 - 44 U/L Final   Alkaline Phosphatase 12/14/2020 143 (A) 38 - 126 U/L Final   Total Bilirubin 12/14/2020 1.2  0.3 - 1.2 mg/dL Final   GFR, Estimated 12/14/2020 >60  >60 mL/min Final   Anion gap 12/14/2020 12  5 - 15 Final   WBC 12/14/2020 21.8 (A) 4.0 - 10.5 K/uL Final   RBC 12/14/2020 3.61 (A) 4.22 - 5.81 MIL/uL Final   Hemoglobin 12/14/2020 10.4 (A) 13.0 - 17.0 g/dL Final   HCT 12/14/2020 33.6 (A) 39.0 - 52.0 % Final   MCV 12/14/2020 93.1  80.0 - 100.0 fL Final   MCH 12/14/2020 28.8  26.0 - 34.0 pg Final   MCHC 12/14/2020 31.0  30.0 - 36.0 g/dL Final   RDW 12/14/2020 13.4  11.5 - 15.5 % Final   Platelets 12/14/2020 442 (A) 150 - 400 K/uL Final   nRBC 12/14/2020 0.0  0.0 - 0.2 % Final   Neutrophils Relative % 12/14/2020 80  % Final   Neutro Abs 12/14/2020 17.6 (A) 1.7 - 7.7 K/uL Final   Lymphocytes Relative 12/14/2020 10  % Final   Lymphs Abs 12/14/2020 2.1  0.7 - 4.0 K/uL  Final   Monocytes Relative 12/14/2020 8  % Final   Monocytes Absolute 12/14/2020 1.7 (A) 0.1 - 1.0 K/uL Final   Eosinophils Relative 12/14/2020 0  % Final   Eosinophils Absolute 12/14/2020 0.1  0.0 - 0.5 K/uL Final   Basophils Relative 12/14/2020 0  %  Final   Basophils Absolute 12/14/2020 0.1  0.0 - 0.1 K/uL Final   Immature Granulocytes 12/14/2020 2  % Final   Abs Immature Granulocytes 12/14/2020 0.32 (A) 0.00 - 0.07 K/uL Final   Troponin I (High Sensitivity) 12/14/2020 24 (A) <18 ng/L Final   MRSA by PCR Next Gen 12/15/2020 NOT DETECTED  NOT DETECTED Final   Strep Pneumo Urinary Antigen 12/15/2020 NEGATIVE  NEGATIVE Final   WBC 12/15/2020 22.1 (A) 4.0 - 10.5 K/uL Final   RBC 12/15/2020 3.87 (A) 4.22 - 5.81 MIL/uL Final   Hemoglobin 12/15/2020 11.3 (A) 13.0 - 17.0 g/dL Final   HCT 12/15/2020 36.2 (A) 39.0 - 52.0 % Final   MCV 12/15/2020 93.5  80.0 - 100.0 fL Final   MCH 12/15/2020 29.2  26.0 - 34.0 pg Final   MCHC 12/15/2020 31.2  30.0 - 36.0 g/dL Final   RDW 12/15/2020 13.5  11.5 - 15.5 % Final   Platelets 12/15/2020 451 (A) 150 - 400 K/uL Final   nRBC 12/15/2020 0.0  0.0 - 0.2 % Final   Sodium 12/15/2020 132 (A) 135 - 145 mmol/L Final   Potassium 12/15/2020 3.8  3.5 - 5.1 mmol/L Final   Chloride 12/15/2020 97 (A) 98 - 111 mmol/L Final   CO2 12/15/2020 23  22 - 32 mmol/L Final   Glucose, Bld 12/15/2020 116 (A) 70 - 99 mg/dL Final   BUN 12/15/2020 11  8 - 23 mg/dL Final   Creatinine, Ser 12/15/2020 0.97  0.61 - 1.24 mg/dL Final   Calcium 12/15/2020 8.2 (A) 8.9 - 10.3 mg/dL Final   Total Protein 12/15/2020 6.9  6.5 - 8.1 g/dL Final   Albumin 12/15/2020 2.4 (A) 3.5 - 5.0 g/dL Final   AST 12/15/2020 54 (A) 15 - 41 U/L Final   ALT 12/15/2020 65 (A) 0 - 44 U/L Final   Alkaline Phosphatase 12/15/2020 147 (A) 38 - 126 U/L Final   Total Bilirubin 12/15/2020 1.2  0.3 - 1.2 mg/dL Final   GFR, Estimated 12/15/2020 >60  >60 mL/min Final   Anion gap 12/15/2020 12  5 - 15 Final   SARS  Coronavirus 2 by RT PCR 12/15/2020 NEGATIVE  NEGATIVE Final   Influenza A by PCR 12/15/2020 NEGATIVE  NEGATIVE Final   Influenza B by PCR 12/15/2020 NEGATIVE  NEGATIVE Final   Prothrombin Time 12/15/2020 16.7 (A) 11.4 - 15.2 seconds Final   INR 12/15/2020 1.4 (A) 0.8 - 1.2 Final   WBC 12/16/2020 20.6 (A) 4.0 - 10.5 K/uL Final   RBC 12/16/2020 3.53 (A) 4.22 - 5.81 MIL/uL Final   Hemoglobin 12/16/2020 10.4 (A) 13.0 - 17.0 g/dL Final   HCT 12/16/2020 32.2 (A) 39.0 - 52.0 % Final   MCV 12/16/2020 91.2  80.0 - 100.0 fL Final   MCH 12/16/2020 29.5  26.0 - 34.0 pg Final   MCHC 12/16/2020 32.3  30.0 - 36.0 g/dL Final   RDW 12/16/2020 13.6  11.5 - 15.5 % Final   Platelets 12/16/2020 431 (A) 150 - 400 K/uL Final   nRBC 12/16/2020 0.0  0.0 - 0.2 % Final   Sodium 12/16/2020 134 (A) 135 - 145 mmol/L Final   Potassium 12/16/2020 4.3  3.5 - 5.1 mmol/L Final   Chloride 12/16/2020 99  98 - 111 mmol/L Final   CO2 12/16/2020 27  22 - 32 mmol/L Final   Glucose, Bld 12/16/2020 110 (A) 70 - 99 mg/dL Final   BUN 12/16/2020 11  8 - 23 mg/dL Final   Creatinine,  Ser 12/16/2020 1.01  0.61 - 1.24 mg/dL Final   Calcium 12/16/2020 8.0 (A) 8.9 - 10.3 mg/dL Final   GFR, Estimated 12/16/2020 >60  >60 mL/min Final   Anion gap 12/16/2020 8  5 - 15 Final    Imaging Studies  No results found. Medications   Scheduled Meds:  enoxaparin (LOVENOX) injection  40 mg Subcutaneous Q24H   mouth rinse  15 mL Mouth Rinse BID     LOS: 1 day   Time spent: >76min  [name], DO [facility] 12/16/2020, 7:32 AM   To contact the [facility] Attending or Consulting provider for this patient: Check the care team in [facility] for a) attending/consulting [place] provider listed and b) the [facility] team listed Log into [email or web address] and use [place]'s universal password to access. If you do not have the password, please contact the hospital operator. Locate the [facility] provider you are looking for under [facility] and page to a  number that you can be directly reached. If you still have difficulty reaching the provider, please page the [facility] (Director on Call) for the Hospitalists listed on amion for assistance.

## 2020-12-16 NOTE — Plan of Care (Signed)

## 2020-12-17 ENCOUNTER — Inpatient Hospital Stay (HOSPITAL_COMMUNITY): Payer: Medicare Other

## 2020-12-17 DIAGNOSIS — J189 Pneumonia, unspecified organism: Secondary | ICD-10-CM | POA: Diagnosis not present

## 2020-12-17 LAB — CBC
HCT: 29.5 % — ABNORMAL LOW (ref 39.0–52.0)
Hemoglobin: 9.5 g/dL — ABNORMAL LOW (ref 13.0–17.0)
MCH: 29.5 pg (ref 26.0–34.0)
MCHC: 32.2 g/dL (ref 30.0–36.0)
MCV: 91.6 fL (ref 80.0–100.0)
Platelets: 410 10*3/uL — ABNORMAL HIGH (ref 150–400)
RBC: 3.22 MIL/uL — ABNORMAL LOW (ref 4.22–5.81)
RDW: 13.6 % (ref 11.5–15.5)
WBC: 17.1 10*3/uL — ABNORMAL HIGH (ref 4.0–10.5)
nRBC: 0 % (ref 0.0–0.2)

## 2020-12-17 NOTE — Evaluation (Signed)
Physical Therapy Evaluation Patient Details Name: Brett Bean MRN: [id number] DOB: [birth year] Today's Date: 12/17/2020  History of Present Illness  75 y.o. male  presented to ED on 12/14/2020 with CO, SOB x about 10 days. In the ED,pt found to have right-sided pneumonia and effusion. Treated with IV Antibiotics. PMH: HLD  Clinical Impression  Patient evaluated by Physical Therapy with no further acute PT needs identified. All education has been completed and the patient has no further questions. Pt is independent with all mobility including stairs with no supplemental O2 needs. Pt has wonderful support at home and is agreeable to continued mobility at discharge.  Pt has no follow-up Physical Therapy or equipment needs. PT is signing off. Thank you for this referral.        Recommendations for follow up therapy are one component of a multi-disciplinary discharge planning process, led by the attending physician.  Recommendations may be updated based on patient status, additional functional criteria and insurance authorization.  Follow Up Recommendations No PT follow up    Equipment Recommendations  None recommended by PT       Precautions / Restrictions Precautions Precautions: None Restrictions Weight Bearing Restrictions: No      Mobility  Bed Mobility               General bed mobility comments: OOB in recliner on entry    Transfers Overall transfer level: Independent                  Ambulation/Gait Ambulation/Gait assistance: Independent Gait Distance (Feet): 600 Feet Assistive device: None Gait Pattern/deviations: Step-through pattern;WFL(Within Functional Limits) Gait velocity: WFL Gait velocity interpretation: >2.62 ft/sec, indicative of community ambulatory General Gait Details: requires no AD or outside support for strong, steady ambulation  Stairs Stairs: Yes Stairs assistance: Modified independent (Device/Increase time) Stair Management:  Forwards;One rail Right Number of Stairs: 5 (1 step up and back x5) General stair comments: strong, steady stepping, HR in 110s SaO2 with good pleth waveform 90%O2        Balance Overall balance assessment: Independent                                           Pertinent Vitals/Pain Pain Assessment: No/denies pain    Home Living Family/patient expects to be discharged to:: Private residence Living Arrangements: Spouse/significant other   Type of Home: House Home Access: Stairs to enter Entrance Stairs-Rails: Psychiatric nurse of Steps: 2 Home Layout: Multi-level;Laundry or work area in [employer]: None      Prior Function Level of Independence: Independent                  Extremity/Trunk Assessment   Upper Extremity Assessment Upper Extremity Assessment: Overall WFL for tasks assessed    Lower Extremity Assessment Lower Extremity Assessment: Overall WFL for tasks assessed    Cervical / Trunk Assessment Cervical / Trunk Assessment: Normal  Communication   Communication: No difficulties  Cognition Arousal/Alertness: Awake/alert Behavior During Therapy: WFL for tasks assessed/performed Overall Cognitive Status: Within Functional Limits for tasks assessed                                        General Comments General comments (skin integrity, edema, etc.): Wife, daughter and son-in law  present, SaO2 >90%O2 when sensor has good pleth waveform, max HR noted with stair stepping 114 bpm, discussed graded response to activity, however need for mobility frequently throughout the day.        Assessment/Plan    PT Assessment Patent does not need any further PT services         PT Goals (Current goals can be found in the Care Plan section)  Acute Rehab PT Goals Patient Stated Goal: go home     AM-PAC PT "6 Clicks" Mobility  Outcome Measure Help needed turning from your back to your side  while in a flat bed without using bedrails?: None Help needed moving from lying on your back to sitting on the side of a flat bed without using bedrails?: None Help needed moving to and from a bed to a chair (including a wheelchair)?: None Help needed standing up from a chair using your arms (e.g., wheelchair or bedside chair)?: None Help needed to walk in hospital room?: None Help needed climbing 3-5 steps with a railing? : None 6 Click Score: 24    End of Session Equipment Utilized During Treatment: Gait belt Activity Tolerance: Patient tolerated treatment well Patient left: in chair;with call bell/phone within reach;with family/visitor present Nurse Communication: Mobility status;Other (comment) (Ambulates on RA with SaO2 greater >90%O2 when sensor providing good pleth waveform) PT Visit Diagnosis: Muscle weakness (generalized) (M62.81)    Time: 1437-1500 PT Time Calculation (min) (ACUTE ONLY): 23 min   Charges:   PT Evaluation $PT Eval Low Complexity: 1 Low PT Treatments $Therapeutic Exercise: 8-22 mins        Brett Bean B. Migdalia Dk PT, DPT Acute Rehabilitation Services Pager [phone] Office [phone]   [place] 12/17/2020, 3:12 PM

## 2020-12-17 NOTE — Progress Notes (Signed)
[name]    DOB: March 16, 1945, 75 y.o.  LYY:[id number]  PCP: Iona Beard, MD   Code Status: Full Code   DOA: 12/14/2020   LOS: 2  Brief Narrative of Current Hospitalization  Brett Bean is a 75 y.o. male with a PMH significant for HLD. They presented from home to the ED on 12/14/2020 with CO, SOB x about 10 days. In the ED, it was found that they had right-sided pneumonia and effusion. They were treated with IV antibiotics.  CVTS was consulted for possible chest tube.  Patient was admitted to medicine service for further workup and management of pleural effusion and pneumonia as outlined in detail below.  Assessment & Plan  Principal Problem:   CAP (community acquired pneumonia) Active Problems:   Acute respiratory failure with hypoxia (HCC)   Pleural effusion on right  CAP with right sided pleural effusion with hypoxemia.  Fever more than 24 hours ago, leukocytosis improving.  Patient has no complaints.  No hypoxia.  Chest x-ray with no worse or improved pleural effusion.  Seen by CT surgery as well as PCCM, no need for chest tube.  CT surgery okay with patient going home.  Patient wants to go home.  Discussed with pulmonology, they recommend keeping overnight and watching for leukocytosis.  Patient informed.  Continue current antibiotics.  DVT prophylaxis: enoxaparin (LOVENOX) injection 40 mg Start: 12/16/20 1000   Diet:  Diet Orders (From admission, onward)     Start     Ordered   12/15/20 0904  Diet Heart Room service appropriate? Yes; Fluid consistency: Thin  Diet effective now       Question Answer Comment  Room service appropriate? Yes   Fluid consistency: Thin      12/15/20 0904            Subjective 12/17/20    Patient seen and examined.  His wife is at the bedside.  Patient has no complaints.  Eager to go home.  Disposition Plan & Communication  Status is: Inpatient  Remains inpatient appropriate because: Severity of illness requires  inpatient care Family Communication: Wife at bedside  Consults, Procedures, Significant Events  Consultants:  IR, CCM, CVTS, ID  Procedures/significant events:  None  Antimicrobials:  Anti-infectives (From admission, onward)    Start     Dose/Rate Route Frequency Ordered Stop   12/15/20 2200  azithromycin (ZITHROMAX) 500 mg in sodium chloride 0.9 % 250 mL IVPB        500 mg 250 mL/hr over 60 Minutes Intravenous Every 24 hours 12/15/20 0045 12/20/20 2159   12/15/20 2000  cefTRIAXone (ROCEPHIN) 2 g in sodium chloride 0.9 % 100 mL IVPB        2 g 200 mL/hr over 30 Minutes Intravenous Every 24 hours 12/15/20 0045 12/20/20 1959   12/14/20 2045  cefTRIAXone (ROCEPHIN) 2 g in sodium chloride 0.9 % 100 mL IVPB        2 g 200 mL/hr over 30 Minutes Intravenous  Once 12/14/20 2043 12/14/20 2247   12/14/20 2045  azithromycin (ZITHROMAX) 500 mg in sodium chloride 0.9 % 250 mL IVPB        500 mg 250 mL/hr over 60 Minutes Intravenous  Once 12/14/20 2043 12/15/20 0011        Objective   Vitals:   12/16/20 2300 12/17/20 0300 12/17/20 0315 12/17/20 0808  BP: 117/83  119/80 127/77  Pulse: 85 88 79 90  Resp: 20 19 20  (!) 25  Temp: 97.9 F (36.6 C)  97.7 F (36.5 C) 97.8 F (36.6 C)  TempSrc: Oral  Oral Oral  SpO2: 95% 91% 94% 90%  Weight:      Height:        Intake/Output Summary (Last 24 hours) at 12/17/2020 1014 Last data filed at 12/17/2020 0830 Gross per 24 hour  Intake 3104.13 ml  Output 975 ml  Net 2129.13 ml    Filed Weights   12/15/20 0217  Weight: 79.6 kg    Patient BMI: Body mass index is 26.68 kg/m.   General exam: Appears calm and comfortable  Respiratory system: Diminished breath sounds with rhonchi at the right base. Cardiovascular system: S1 & S2 heard, RRR. No JVD, murmurs, rubs, gallops or clicks. No pedal edema. Gastrointestinal system: Abdomen is nondistended, soft and nontender. No organomegaly or masses felt. Normal bowel sounds heard. Central nervous  system: Alert and oriented. No focal neurological deficits. Extremities: Symmetric 5 x 5 power. Skin: No rashes, lesions or ulcers.  Psychiatry: Judgement and insight appear normal. Mood & affect appropriate.    Labs   I have personally reviewed following labs and imaging studies Admission on 12/14/2020  Component Date Value Ref Range Status   Troponin I (High Sensitivity) 12/14/2020 73 (A) <18 ng/L Final   Sodium 12/14/2020 130 (A) 135 - 145 mmol/L Final   Potassium 12/14/2020 3.7  3.5 - 5.1 mmol/L Final   Chloride 12/14/2020 93 (A) 98 - 111 mmol/L Final   CO2 12/14/2020 25  22 - 32 mmol/L Final   Glucose, Bld 12/14/2020 106 (A) 70 - 99 mg/dL Final   BUN 12/14/2020 11  8 - 23 mg/dL Final   Creatinine, Ser 12/14/2020 1.05  0.61 - 1.24 mg/dL Final   Calcium 12/14/2020 8.2 (A) 8.9 - 10.3 mg/dL Final   Total Protein 12/14/2020 6.9  6.5 - 8.1 g/dL Final   Albumin 12/14/2020 2.2 (A) 3.5 - 5.0 g/dL Final   AST 12/14/2020 65 (A) 15 - 41 U/L Final   ALT 12/14/2020 68 (A) 0 - 44 U/L Final   Alkaline Phosphatase 12/14/2020 143 (A) 38 - 126 U/L Final   Total Bilirubin 12/14/2020 1.2  0.3 - 1.2 mg/dL Final   GFR, Estimated 12/14/2020 >60  >60 mL/min Final   Anion gap 12/14/2020 12  5 - 15 Final   WBC 12/14/2020 21.8 (A) 4.0 - 10.5 K/uL Final   RBC 12/14/2020 3.61 (A) 4.22 - 5.81 MIL/uL Final   Hemoglobin 12/14/2020 10.4 (A) 13.0 - 17.0 g/dL Final   HCT 12/14/2020 33.6 (A) 39.0 - 52.0 % Final   MCV 12/14/2020 93.1  80.0 - 100.0 fL Final   MCH 12/14/2020 28.8  26.0 - 34.0 pg Final   MCHC 12/14/2020 31.0  30.0 - 36.0 g/dL Final   RDW 12/14/2020 13.4  11.5 - 15.5 % Final   Platelets 12/14/2020 442 (A) 150 - 400 K/uL Final   nRBC 12/14/2020 0.0  0.0 - 0.2 % Final   Neutrophils Relative % 12/14/2020 80  % Final   Neutro Abs 12/14/2020 17.6 (A) 1.7 - 7.7 K/uL Final   Lymphocytes Relative 12/14/2020 10  % Final   Lymphs Abs 12/14/2020 2.1  0.7 - 4.0 K/uL Final   Monocytes Relative 12/14/2020 8   % Final   Monocytes Absolute 12/14/2020 1.7 (A) 0.1 - 1.0 K/uL Final   Eosinophils Relative 12/14/2020 0  % Final   Eosinophils Absolute 12/14/2020 0.1  0.0 - 0.5 K/uL Final   Basophils Relative  12/14/2020 0  % Final   Basophils Absolute 12/14/2020 0.1  0.0 - 0.1 K/uL Final   Immature Granulocytes 12/14/2020 2  % Final   Abs Immature Granulocytes 12/14/2020 0.32 (A) 0.00 - 0.07 K/uL Final   Troponin I (High Sensitivity) 12/14/2020 24 (A) <18 ng/L Final   MRSA by PCR Next Gen 12/15/2020 NOT DETECTED  NOT DETECTED Final   Specimen Description 12/15/2020 BLOOD LEFT ANTECUBITAL   Final   Special Requests 12/15/2020 BOTTLES DRAWN AEROBIC AND ANAEROBIC Blood Culture results may not be optimal due to an inadequate volume of blood received in culture bottles   Final   Culture 12/15/2020    Final                   Value:NO GROWTH 2 DAYS Performed at [facility] [address], [place], [place] [address]    Report Status 12/15/2020 PENDING   Incomplete   Specimen Description 12/15/2020 BLOOD LEFT FOREARM   Final   Special Requests 12/15/2020 BOTTLES DRAWN AEROBIC AND ANAEROBIC Blood Culture results may not be optimal due to an inadequate volume of blood received in culture bottles   Final   Culture 12/15/2020    Final                   Value:NO GROWTH 2 DAYS Performed at [facility], [place] [address], [place], [place] [address]    Report Status 12/15/2020 PENDING   Incomplete   Strep Pneumo Urinary Antigen 12/15/2020 NEGATIVE  NEGATIVE Final   WBC 12/15/2020 22.1 (A) 4.0 - 10.5 K/uL Final   RBC 12/15/2020 3.87 (A) 4.22 - 5.81 MIL/uL Final   Hemoglobin 12/15/2020 11.3 (A) 13.0 - 17.0 g/dL Final   HCT 12/15/2020 36.2 (A) 39.0 - 52.0 % Final   MCV 12/15/2020 93.5  80.0 - 100.0 fL Final   MCH 12/15/2020 29.2  26.0 - 34.0 pg Final   MCHC 12/15/2020 31.2  30.0 - 36.0 g/dL Final   RDW 12/15/2020 13.5  11.5 - 15.5 % Final   Platelets 12/15/2020 451 (A) 150 - 400 K/uL Final   nRBC  12/15/2020 0.0  0.0 - 0.2 % Final   Sodium 12/15/2020 132 (A) 135 - 145 mmol/L Final   Potassium 12/15/2020 3.8  3.5 - 5.1 mmol/L Final   Chloride 12/15/2020 97 (A) 98 - 111 mmol/L Final   CO2 12/15/2020 23  22 - 32 mmol/L Final   Glucose, Bld 12/15/2020 116 (A) 70 - 99 mg/dL Final   BUN 12/15/2020 11  8 - 23 mg/dL Final   Creatinine, Ser 12/15/2020 0.97  0.61 - 1.24 mg/dL Final   Calcium 12/15/2020 8.2 (A) 8.9 - 10.3 mg/dL Final   Total Protein 12/15/2020 6.9  6.5 - 8.1 g/dL Final   Albumin 12/15/2020 2.4 (A) 3.5 - 5.0 g/dL Final   AST 12/15/2020 54 (A) 15 - 41 U/L Final   ALT 12/15/2020 65 (A) 0 - 44 U/L Final   Alkaline Phosphatase 12/15/2020 147 (A) 38 - 126 U/L Final   Total Bilirubin 12/15/2020 1.2  0.3 - 1.2 mg/dL Final   GFR, Estimated 12/15/2020 >60  >60 mL/min Final   Anion gap 12/15/2020 12  5 - 15 Final   SARS Coronavirus 2 by RT PCR 12/15/2020 NEGATIVE  NEGATIVE Final   Influenza A by PCR 12/15/2020 NEGATIVE  NEGATIVE Final   Influenza B by PCR 12/15/2020 NEGATIVE  NEGATIVE Final   Prothrombin Time 12/15/2020 16.7 (A) 11.4 -  15.2 seconds Final   INR 12/15/2020 1.4 (A) 0.8 - 1.2 Final   WBC 12/16/2020 20.6 (A) 4.0 - 10.5 K/uL Final   RBC 12/16/2020 3.53 (A) 4.22 - 5.81 MIL/uL Final   Hemoglobin 12/16/2020 10.4 (A) 13.0 - 17.0 g/dL Final   HCT 12/16/2020 32.2 (A) 39.0 - 52.0 % Final   MCV 12/16/2020 91.2  80.0 - 100.0 fL Final   MCH 12/16/2020 29.5  26.0 - 34.0 pg Final   MCHC 12/16/2020 32.3  30.0 - 36.0 g/dL Final   RDW 12/16/2020 13.6  11.5 - 15.5 % Final   Platelets 12/16/2020 431 (A) 150 - 400 K/uL Final   nRBC 12/16/2020 0.0  0.0 - 0.2 % Final   Sodium 12/16/2020 134 (A) 135 - 145 mmol/L Final   Potassium 12/16/2020 4.3  3.5 - 5.1 mmol/L Final   Chloride 12/16/2020 99  98 - 111 mmol/L Final   CO2 12/16/2020 27  22 - 32 mmol/L Final   Glucose, Bld 12/16/2020 110 (A) 70 - 99 mg/dL Final   BUN 12/16/2020 11  8 - 23 mg/dL Final   Creatinine, Ser 12/16/2020 1.01   0.61 - 1.24 mg/dL Final   Calcium 12/16/2020 8.0 (A) 8.9 - 10.3 mg/dL Final   GFR, Estimated 12/16/2020 >60  >60 mL/min Final   Anion gap 12/16/2020 8  5 - 15 Final   WBC 12/17/2020 17.1 (A) 4.0 - 10.5 K/uL Final   RBC 12/17/2020 3.22 (A) 4.22 - 5.81 MIL/uL Final   Hemoglobin 12/17/2020 9.5 (A) 13.0 - 17.0 g/dL Final   HCT 12/17/2020 29.5 (A) 39.0 - 52.0 % Final   MCV 12/17/2020 91.6  80.0 - 100.0 fL Final   MCH 12/17/2020 29.5  26.0 - 34.0 pg Final   MCHC 12/17/2020 32.2  30.0 - 36.0 g/dL Final   RDW 12/17/2020 13.6  11.5 - 15.5 % Final   Platelets 12/17/2020 410 (A) 150 - 400 K/uL Final   nRBC 12/17/2020 0.0  0.0 - 0.2 % Final    Imaging Studies  DG Chest 2 View  Result Date: 12/17/2020 CLINICAL DATA:  Chest pain EXAM: CHEST - 2 VIEW COMPARISON:  One day prior FINDINGS: Numerous leads and wires project over the chest. Midline trachea. Mild cardiomegaly. Right hemidiaphragm elevation. Similar small right pleural effusion. No pneumothorax. Mild interstitial prominence. Persistent right base airspace disease. IMPRESSION: No significant change since one day prior. Right pleural effusion and adjacent airspace disease, atelectasis or infection. Cardiomegaly with pulmonary interstitial thickening, suspicious for venous congestion. Electronically Signed   By: Abigail Miyamoto M.D.   On: 12/17/2020 08:39   Medications   Scheduled Meds:  enoxaparin (LOVENOX) injection  40 mg Subcutaneous Q24H   mouth rinse  15 mL Mouth Rinse BID     LOS: 2 days   Time spent: 30 minutes  Darliss Cheney, MD [facility] 12/17/2020, 10:14 AM   To contact the [facility] Attending or Consulting provider for this patient: Check the care team in [facility] for a) attending/consulting [place] provider listed and b) the [facility] team listed Log into [email or web address] and use [place]'s universal password to access. If you do not have the password, please contact the hospital operator. Locate the [facility] provider you are looking for under  [facility] and page to a number that you can be directly reached. If you still have difficulty reaching the provider, please page the [facility] (Director on Call) for the Hospitalists listed on amion for assistance.

## 2020-12-17 NOTE — Progress Notes (Addendum)
      [address]       [place] [address]             [phone]       Today's Vitals   12/16/20 2300 12/17/20 0300 12/17/20 0315 12/17/20 0808  BP: 117/83  119/80 127/77  Pulse: 85 88 79 90  Resp: 20 19 20  (!) 25  Temp: 97.9 F (36.6 C)  97.7 F (36.5 C) 97.8 F (36.6 C)  TempSrc: Oral  Oral Oral  SpO2: 95% 91% 94% 90%  Weight:      Height:      PainSc: 0-No pain 0-No pain     Body mass index is 26.68 kg/m.   Sats ok on RA Leukocytosis trend improving CXR - stable appearance c/w prev film  He states he is feeling much better  Alert, NAD appears comfortable Lungs dim right base Cor: irreg( sinus with PAC's) Abd benign   Conts medical management  John Giovanni, PA-C   Agree with above Lajuana Matte

## 2020-12-18 ENCOUNTER — Inpatient Hospital Stay (HOSPITAL_COMMUNITY): Payer: Medicare Other

## 2020-12-18 DIAGNOSIS — J869 Pyothorax without fistula: Secondary | ICD-10-CM

## 2020-12-18 DIAGNOSIS — J189 Pneumonia, unspecified organism: Secondary | ICD-10-CM | POA: Diagnosis not present

## 2020-12-18 LAB — CBC WITH DIFFERENTIAL/PLATELET
Abs Immature Granulocytes: 0.21 K/uL — ABNORMAL HIGH (ref 0.00–0.07)
Basophils Absolute: 0.1 K/uL (ref 0.0–0.1)
Basophils Relative: 0 %
Eosinophils Absolute: 0.2 K/uL (ref 0.0–0.5)
Eosinophils Relative: 1 %
HCT: 31.5 % — ABNORMAL LOW (ref 39.0–52.0)
Hemoglobin: 10.2 g/dL — ABNORMAL LOW (ref 13.0–17.0)
Immature Granulocytes: 1 %
Lymphocytes Relative: 13 %
Lymphs Abs: 2.4 K/uL (ref 0.7–4.0)
MCH: 29.8 pg (ref 26.0–34.0)
MCHC: 32.4 g/dL (ref 30.0–36.0)
MCV: 92.1 fL (ref 80.0–100.0)
Monocytes Absolute: 1.3 K/uL — ABNORMAL HIGH (ref 0.1–1.0)
Monocytes Relative: 7 %
Neutro Abs: 14.1 K/uL — ABNORMAL HIGH (ref 1.7–7.7)
Neutrophils Relative %: 78 %
Platelets: 457 K/uL — ABNORMAL HIGH (ref 150–400)
RBC: 3.42 MIL/uL — ABNORMAL LOW (ref 4.22–5.81)
RDW: 13.7 % (ref 11.5–15.5)
WBC: 18.2 K/uL — ABNORMAL HIGH (ref 4.0–10.5)
nRBC: 0 % (ref 0.0–0.2)

## 2020-12-18 LAB — BASIC METABOLIC PANEL WITH GFR
Anion gap: 9 (ref 5–15)
BUN: 8 mg/dL (ref 8–23)
CO2: 22 mmol/L (ref 22–32)
Calcium: 8.3 mg/dL — ABNORMAL LOW (ref 8.9–10.3)
Chloride: 101 mmol/L (ref 98–111)
Creatinine, Ser: 0.83 mg/dL (ref 0.61–1.24)
GFR, Estimated: >60 mL/min
Glucose, Bld: 97 mg/dL (ref 70–99)
Potassium: 4.4 mmol/L (ref 3.5–5.1)
Sodium: 132 mmol/L — ABNORMAL LOW (ref 135–145)

## 2020-12-18 MED ORDER — AMOXICILLIN-POT CLAVULANATE 875-125 MG PO TABS: 1.0000 | ORAL_TABLET | Freq: Two times a day (BID) | ORAL | 0 refills | Status: DC

## 2020-12-18 NOTE — Progress Notes (Signed)
Discharge instructions reviewed with patient  and spouse. Written copy given to patient. Both verbalize understanding. Patient via Wheelchair in stable condition

## 2020-12-18 NOTE — Plan of Care (Signed)
  Problem: Education: Goal: Knowledge of General Education information will improve Description: Including pain rating scale, medication(s)/side effects and non-pharmacologic comfort measures Outcome: Completed/Met   Problem: Clinical Measurements: Goal: Ability to maintain clinical measurements within normal limits will improve Outcome: Completed/Met Goal: Will remain free from infection Outcome: Completed/Met Goal: Diagnostic test results will improve Outcome: Completed/Met Goal: Respiratory complications will improve Outcome: Completed/Met Goal: Cardiovascular complication will be avoided Outcome: Completed/Met   Problem: Activity: Goal: Risk for activity intolerance will decrease Outcome: Completed/Met   Problem: Nutrition: Goal: Adequate nutrition will be maintained Outcome: Completed/Met   Problem: Coping: Goal: Level of anxiety will decrease Outcome: Completed/Met   Problem: Elimination: Goal: Will not experience complications related to bowel motility Outcome: Completed/Met Goal: Will not experience complications related to urinary retention Outcome: Completed/Met   Problem: Pain Managment: Goal: General experience of comfort will improve Outcome: Completed/Met   Problem: Safety: Goal: Ability to remain free from injury will improve Outcome: Completed/Met   Problem: Skin Integrity: Goal: Risk for impaired skin integrity will decrease Outcome: Completed/Met   Problem: Activity: Goal: Ability to tolerate increased activity will improve Outcome: Completed/Met   Problem: Clinical Measurements: Goal: Ability to maintain a body temperature in the normal range will improve Outcome: Completed/Met   Problem: Respiratory: Goal: Ability to maintain adequate ventilation will improve Outcome: Completed/Met Goal: Ability to maintain a clear airway will improve Outcome: Completed/Met

## 2020-12-18 NOTE — Discharge Summary (Signed)
Physician Discharge Summary  Brett Bean HGD:[id number] DOB: 1946-02-28 DOA: 12/14/2020  PCP: Iona Beard, MD  Admit date: 12/14/2020 Discharge date: 12/18/2020 30 Day Unplanned Readmission Risk Score    Flowsheet Row ED to Hosp-Admission (Current) from 12/14/2020 in [facility]  30 Day Unplanned Readmission Risk Score (%) 12.85 Filed at 12/18/2020 0801       This score is the patient's risk of an unplanned readmission within 30 days of being discharged (0 -100%). The score is based on dignosis, age, lab data, medications, orders, and past utilization.   Low:  0-14.9   Medium: 15-21.9   High: 22-29.9   Extreme: 30 and above          Admitted From: Home Disposition: Home  Recommendations for Outpatient Follow-up:  Follow up with PCP in 1-2 weeks, please repeat chest x-ray to follow-up on empyema. Please obtain BMP/CBC in one week Please follow up with your PCP on the following pending results: Unresulted Labs (From admission, onward)     Start     Ordered   12/15/20 0041  Expectorated Sputum Assessment w Gram Stain, Rflx to Resp Cult  (COPD / Pneumonia / Cellulitis / Lower Extremity Wound)  Once,   R        12/15/20 [place]: None Equipment/Devices: None  Discharge Condition: Stable CODE STATUS: Full code Diet recommendation: Regular  Subjective: Seen and examined.  He has no complaints.  Wife at the bedside.  He is eager to go home.  Brief/Interim Summary: Brett Bean is a 75 y.o. male with a PMH significant for HLD presented from home to the ED on 12/14/2020 with CO, SOB x about 10 days. In the ED, it was found that he had right-sided pneumonia and effusion.  Admitted to hospital service with broad-spectrum IV antibiotics.  CVTS was consulted for possible chest tube.  PCCM was also consulted.  They did ultrasound.  It was found out that his pleural effusion/empyema is free-flowing so chest tube was not recommended.   His leukocytosis improved.  His last fever was more than 48 hours ago.  Patient has remained symptom-free and his oxygen saturation has been more than 93% on room air since past 2 days.  He has been cleared by cardiothoracic surgery as well as pulmonary.  They recommended Augmentin for 4 weeks and follow-up with PCP within 1 week and repeat chest x-ray to follow-up on his empyema.  Patient has been advised in length that if he were to develop worsening shortness of breath, fever or hemoptysis, he need to seek medical attention sooner than 1 week.  He verbalized understanding.  His wife also verbalized understanding.  Discharge Diagnoses:  Principal Problem:   CAP (community acquired pneumonia) Active Problems:   Acute respiratory failure with hypoxia (HCC)   Pleural effusion on right   Empyema, right [facility])    Discharge Instructions   Allergies as of 12/18/2020       Reactions   Asa [aspirin]         Medication List     TAKE these medications    amoxicillin-clavulanate 875-125 MG tablet Commonly known as: Augmentin Take 1 tablet by mouth 2 (two) times daily for 28 days.   cholecalciferol 25 MCG (1000 UNIT) tablet Commonly known as: VITAMIN D Take 1,000 Units by mouth daily.   EYE VITAMINS PO Take 1 tablet by mouth daily. supplement  Prostate Caps Take 1 capsule by mouth in the morning and at bedtime. supplement        Follow-up Information     Iona Beard, MD Follow up in 1 week(s).   Specialty: Family Medicine Contact information: [address] [place] [address] [phone]         Arnoldo Lenis, MD .   Specialty: Cardiology Contact information: [place] [address] [phone]                Allergies  Allergen Reactions   Diona Fanti [Aspirin]     Consultations: Cardiothoracic surgery and pulmonary critical care   Procedures/Studies: DG Chest 2 View  Result Date: 12/17/2020 CLINICAL DATA:  Chest  pain EXAM: CHEST - 2 VIEW COMPARISON:  One day prior FINDINGS: Numerous leads and wires project over the chest. Midline trachea. Mild cardiomegaly. Right hemidiaphragm elevation. Similar small right pleural effusion. No pneumothorax. Mild interstitial prominence. Persistent right base airspace disease. IMPRESSION: No significant change since one day prior. Right pleural effusion and adjacent airspace disease, atelectasis or infection. Cardiomegaly with pulmonary interstitial thickening, suspicious for venous congestion. Electronically Signed   By: Abigail Miyamoto M.D.   On: 12/17/2020 08:39   DG Chest 2 View  Result Date: 12/14/2020 CLINICAL DATA:  Chest pain short of breath and cough EXAM: CHEST - 2 VIEW COMPARISON:  None. FINDINGS: Right lower lobe consolidation with associated right pleural effusion. Left lung is clear. Negative for heart failure or edema. IMPRESSION: Right lower lobe airspace disease and right effusion. Possible pneumonia or mass lesion. Followup PA and lateral chest X-ray is recommended in 3-4 weeks following trial of antibiotic therapy to ensure resolution and exclude underlying malignancy. Electronically Signed   By: Franchot Gallo M.D.   On: 12/14/2020 16:33   CT Angio Chest PE W and/or Wo Contrast  Result Date: 12/15/2020 CLINICAL DATA:  Chest pain. EXAM: CT ANGIOGRAPHY CHEST WITH CONTRAST TECHNIQUE: Multidetector CT imaging of the chest was performed using the standard protocol during bolus administration of intravenous contrast. Multiplanar CT image reconstructions and MIPs were obtained to evaluate the vascular anatomy. CONTRAST:  54mL OMNIPAQUE IOHEXOL 350 MG/ML SOLN COMPARISON:  None. FINDINGS: Cardiovascular: There is limited evaluation of the subsegmental pulmonary arteries secondary to suboptimal opacification with intravenous contrast. Note true intraluminal filling defects are clearly identified. Normal heart size. No pericardial effusion. Mediastinum/Nodes: There is mild  pretracheal right hilar lymphadenopathy. Thyroid gland, trachea, and esophagus demonstrate no significant findings. Lungs/Pleura: Moderate severity atelectasis and/or infiltrate is seen within the posterior aspects of the right upper lobe, right middle lobe and right lower lobe. There is a moderate to large right pleural effusion with loculated components seen along the anterior aspect of the horizontal fissure and right lung base. No pneumothorax is identified. Elevation of the right hemidiaphragm is noted. Upper Abdomen: There is a small hiatal hernia. Musculoskeletal: An ill-defined, mixed lytic and sclerotic appearing area is noted within the T8 vertebral body. A chronic appearing sclerotic band is seen along the anterior aspect of T11 vertebral body. Review of the MIP images confirms the above findings. IMPRESSION: 1. Limited evaluation of the pulmonary arteries, without evidence of pulmonary embolism. 2. Moderate severity posterior right upper lobe, right middle lobe and right lower lobe atelectasis and/or infiltrate. 3. Moderate to large right pleural effusion with multiple loculated components. 4. Ill-defined, mixed lytic and sclerotic appearing area within the T8 vertebral body. While this area appears to be benign  in etiology, correlation with nonemergent follow-up MRI is recommended. 5. Small hiatal hernia. Electronically Signed   By: Virgina Norfolk M.D.   On: 12/15/2020 00:19   NM Myocar Multi W/Spect W/Wall Motion / EF  Result Date: 12/09/2020   The study is normal. There are no perfusion defects consistent with prior infarct or current ischemia.  The study is low risk.   No ST deviation was noted.   Left ventricular function is normal. Nuclear stress EF: 55 %. The left ventricular ejection fraction is normal (55-65%). End diastolic cavity size is normal.   CT CHEST LIMITED WO CONTRAST  Result Date: 12/16/2020 CLINICAL DATA:  Chest pain. Moderate right pleural effusion with suspicion of  loculation on CTA chest, concern for empyema. Chest drain placement requested. EXAM: CT CHEST WITHOUT CONTRAST TECHNIQUE: With patient in left lateral decubitus positioning, Multidetector CT imaging of the chest was performed without IV contrast. COMPARISON:  CTA 12/14/2020 FINDINGS: Cardiovascular: Heart size normal. Trace pericardial fluid. Scattered aortic calcifications. Mediastinum/Nodes: No mass or adenopathy. Lungs/Pleura: Right pleural effusion. The component previously seen in the major fissure has resolved. The posterior apical component largely resolved. Mild residual fluid at the lung base was noted but after the region was prepped and draped in preparation for tube placement, this dissipated. There is a medial component of the right pleural effusion which has increased, consistent with free-flowing effusion in a lateral decubitus position. No left effusion. No pneumothorax. Dependent and basilar atelectasis in both lungs. Upper Abdomen: No acute findings.  No acute Musculoskeletal: Findings. T8 vertebral body hemangioma. Sclerosis in the anterior aspect of the T11 vertebral body as noted previously. IMPRESSION: 1. The small right pleural effusion is free-flowing without any convincing loculated or enlarging components. Chest drain placement was deferred. Electronically Signed   By: Lucrezia Europe M.D.   On: 12/16/2020 13:08   DG CHEST PORT 1 VIEW  Result Date: 12/18/2020 CLINICAL DATA:  Empyema EXAM: PORTABLE CHEST 1 VIEW COMPARISON:  Chest x-rays dated 12/17/2020 and 12/16/2020. FINDINGS: Stable opacity at the RIGHT lung base. LEFT lung remains clear. Stable cardiomegaly. No pneumothorax is seen. Osseous structures about the chest are unremarkable. IMPRESSION: Stable chest x-ray. Stable opacity at the RIGHT lung base, including pleural effusion, compatible with the given history of empyema. Electronically Signed   By: Franki Cabot M.D.   On: 12/18/2020 08:45   DG Chest Port 1 View  Result Date:  12/16/2020 CLINICAL DATA:  Shortness of breath, pleural effusion EXAM: PORTABLE CHEST 1 VIEW COMPARISON:  12/14/2020 FINDINGS: No significant change in AP portable chest radiograph, with a moderate right pleural associated atelectasis consolidation. The left lung is normally aerated. Cardiomegaly. IMPRESSION: No significant change in AP portable chest radiograph, with a moderate right pleural associated atelectasis and consolidation. The left lung is normally aerated. Electronically Signed   By: Delanna Ahmadi M.D.   On: 12/16/2020 09:13     Discharge Exam: Vitals:   12/18/20 0400 12/18/20 0809  BP:  122/71  Pulse: 97 97  Resp: (!) 21 (!) 25  Temp:  (!) 97.5 F (36.4 C)  SpO2: (!) 87% 90%   Vitals:   12/17/20 2352 12/18/20 0100 12/18/20 0400 12/18/20 0809  BP: 132/88 106/69  122/71  Pulse: 92 90 97 97  Resp:  (!) 30 (!) 21 (!) 25  Temp: 98.9 F (37.2 C) 98.4 F (36.9 C)  (!) 97.5 F (36.4 C)  TempSrc: Oral Oral  Oral  SpO2: 93% 93% (!) 87% 90%  Weight:  Height:        General: Pt is alert, awake, not in acute distress Cardiovascular: RRR, S1/S2 +, no rubs, no gallops Respiratory: Diminished breath sounds with rhonchi of the right middle and lower lobe, no wheezes. Abdominal: Soft, NT, ND, bowel sounds + Extremities: no edema, no cyanosis    The results of significant diagnostics from this hospitalization (including imaging, microbiology, ancillary and laboratory) are listed below for reference.     Microbiology: Recent Results (from the past 240 hour(s))  MRSA Next Gen by PCR, Nasal     Status: None   Collection Time: 12/15/20 12:23 AM   Specimen: Nasal Mucosa; Nasal Swab  Result Value Ref Range Status   MRSA by PCR Next Gen NOT DETECTED NOT DETECTED Final    Comment: (NOTE) The GeneXpert MRSA Assay (FDA approved for NASAL specimens only), is one component of a comprehensive MRSA colonization surveillance program. It is not intended to diagnose MRSA infection nor  to guide or monitor treatment for MRSA infections. Test performance is not FDA approved in patients less than 66 years old. Performed at [facility], [place] [address] [place], [place] [address]   Culture, blood (routine x 2)     Status: None (Preliminary result)   Collection Time: 12/15/20 12:40 AM   Specimen: BLOOD  Result Value Ref Range Status   Specimen Description BLOOD LEFT ANTECUBITAL  Final   Special Requests   Final    BOTTLES DRAWN AEROBIC AND ANAEROBIC Blood Culture results may not be optimal due to an inadequate volume of blood received in culture bottles   Culture   Final    NO GROWTH 3 DAYS Performed at [facility], [place] [address] [place], [place] [address]    Report Status PENDING  Incomplete  Culture, blood (routine x 2)     Status: None (Preliminary result)   Collection Time: 12/15/20 12:50 AM   Specimen: BLOOD LEFT FOREARM  Result Value Ref Range Status   Specimen Description BLOOD LEFT FOREARM  Final   Special Requests   Final    BOTTLES DRAWN AEROBIC AND ANAEROBIC Blood Culture results may not be optimal due to an inadequate volume of blood received in culture bottles   Culture   Final    NO GROWTH 3 DAYS Performed at [facility], [place] [address] [place], [place] [address]    Report Status PENDING  Incomplete  Resp Panel by RT-PCR (Flu A&B, Covid) Nasal Mucosa     Status: None   Collection Time: 12/15/20  1:03 AM   Specimen: Nasal Mucosa; Nasopharyngeal(NP) swabs in vial transport medium  Result Value Ref Range Status   SARS Coronavirus 2 by RT PCR NEGATIVE NEGATIVE Final    Comment: (NOTE) SARS-CoV-2 target nucleic acids are NOT DETECTED.  The SARS-CoV-2 RNA is generally detectable in upper respiratory specimens during the acute phase of infection. The lowest concentration of SARS-CoV-2 viral copies this assay can detect is 138 copies/mL. A negative result does not preclude SARS-Cov-2 infection and should not be used as the sole  basis for treatment or other patient management decisions. A negative result may occur with  improper specimen collection/handling, submission of specimen other than nasopharyngeal swab, presence of viral mutation(s) within the areas targeted by this assay, and inadequate number of viral copies(<138 copies/mL). A negative result must be combined with clinical observations, patient history, and epidemiological information. The expected result is Negative.  Fact Sheet for Patients:  EntrepreneurPulse.com.au  Fact  Sheet for Healthcare Providers:  IncredibleEmployment.be  This test is no t yet approved or cleared by the [place] FDA and  has been authorized for detection and/or diagnosis of SARS-CoV-2 by FDA under an Emergency Use Authorization (EUA). This EUA will remain  in effect (meaning this test can be used) for the duration of the COVID-19 declaration under Section 564(b)(1) of the Act, 21 U.S.C.section 360bbb-3(b)(1), unless the authorization is terminated  or revoked sooner.       Influenza A by PCR NEGATIVE NEGATIVE Final   Influenza B by PCR NEGATIVE NEGATIVE Final    Comment: (NOTE) The Xpert Xpress SARS-CoV-2/FLU/RSV plus assay is intended as an aid in the diagnosis of influenza from Nasopharyngeal swab specimens and should not be used as a sole basis for treatment. Nasal washings and aspirates are unacceptable for Xpert Xpress SARS-CoV-2/FLU/RSV testing.  Fact Sheet for Patients: EntrepreneurPulse.com.au  Fact Sheet for Healthcare Providers: IncredibleEmployment.be  This test is not yet approved or cleared by the [place] FDA and has been authorized for detection and/or diagnosis of SARS-CoV-2 by FDA under an Emergency Use Authorization (EUA). This EUA will remain in effect (meaning this test can be used) for the duration of the COVID-19 declaration under Section 564(b)(1) of the Act,  21 U.S.C. section 360bbb-3(b)(1), unless the authorization is terminated or revoked.  Performed at [facility], [place] [address] [place], [place] [address]      Labs: BNP (last 3 results) No results for input(s): BNP in the last 8760 hours. Basic Metabolic Panel: Recent Labs  Lab 12/14/20 1546 12/15/20 0050 12/16/20 0028 12/18/20 0706  NA 130* 132* 134* 132*  K 3.7 3.8 4.3 4.4  CL 93* 97* 99 101  CO2 25 23 27 22   GLUCOSE 106* 116* 110* 97  BUN 11 11 11 8   CREATININE 1.05 0.97 1.01 0.83  CALCIUM 8.2* 8.2* 8.0* 8.3*   Liver Function Tests: Recent Labs  Lab 12/14/20 1546 12/15/20 0050  AST 65* 54*  ALT 68* 65*  ALKPHOS 143* 147*  BILITOT 1.2 1.2  PROT 6.9 6.9  ALBUMIN 2.2* 2.4*   No results for input(s): LIPASE, AMYLASE in the last 168 hours. No results for input(s): AMMONIA in the last 168 hours. CBC: Recent Labs  Lab 12/14/20 1546 12/15/20 0050 12/16/20 0028 12/17/20 0139 12/18/20 0706  WBC 21.8* 22.1* 20.6* 17.1* 18.2*  NEUTROABS 17.6*  --   --   --  14.1*  HGB 10.4* 11.3* 10.4* 9.5* 10.2*  HCT 33.6* 36.2* 32.2* 29.5* 31.5*  MCV 93.1 93.5 91.2 91.6 92.1  PLT 442* 451* 431* 410* 457*   Cardiac Enzymes: No results for input(s): CKTOTAL, CKMB, CKMBINDEX, TROPONINI in the last 168 hours. BNP: Invalid input(s): POCBNP CBG: No results for input(s): GLUCAP in the last 168 hours. D-Dimer No results for input(s): DDIMER in the last 72 hours. Hgb A1c No results for input(s): HGBA1C in the last 72 hours. Lipid Profile No results for input(s): CHOL, HDL, LDLCALC, TRIG, CHOLHDL, LDLDIRECT in the last 72 hours. Thyroid function studies No results for input(s): TSH, T4TOTAL, T3FREE, THYROIDAB in the last 72 hours.  Invalid input(s): FREET3 Anemia work up No results for input(s): VITAMINB12, FOLATE, FERRITIN, TIBC, IRON, RETICCTPCT in the last 72 hours. Urinalysis No results found for: COLORURINE, APPEARANCEUR, LABSPEC, [place], GLUCOSEU, [place],  [place], [place], PROTEINUR, UROBILINOGEN, NITRITE, LEUKOCYTESUR Sepsis Labs Invalid input(s): PROCALCITONIN,  WBC,  LACTICIDVEN Microbiology Recent Results (from the past 240 hour(s))  MRSA Next Gen by PCR,  Nasal     Status: None   Collection Time: 12/15/20 12:23 AM   Specimen: Nasal Mucosa; Nasal Swab  Result Value Ref Range Status   MRSA by PCR Next Gen NOT DETECTED NOT DETECTED Final    Comment: (NOTE) The GeneXpert MRSA Assay (FDA approved for NASAL specimens only), is one component of a comprehensive MRSA colonization surveillance program. It is not intended to diagnose MRSA infection nor to guide or monitor treatment for MRSA infections. Test performance is not FDA approved in patients less than 69 years old. Performed at [facility], [place] [address] [place], [place] [address]   Culture, blood (routine x 2)     Status: None (Preliminary result)   Collection Time: 12/15/20 12:40 AM   Specimen: BLOOD  Result Value Ref Range Status   Specimen Description BLOOD LEFT ANTECUBITAL  Final   Special Requests   Final    BOTTLES DRAWN AEROBIC AND ANAEROBIC Blood Culture results may not be optimal due to an inadequate volume of blood received in culture bottles   Culture   Final    NO GROWTH 3 DAYS Performed at [facility], [place] [address], [place], [place] [address]    Report Status PENDING  Incomplete  Culture, blood (routine x 2)     Status: None (Preliminary result)   Collection Time: 12/15/20 12:50 AM   Specimen: BLOOD LEFT FOREARM  Result Value Ref Range Status   Specimen Description BLOOD LEFT FOREARM  Final   Special Requests   Final    BOTTLES DRAWN AEROBIC AND ANAEROBIC Blood Culture results may not be optimal due to an inadequate volume of blood received in culture bottles   Culture   Final    NO GROWTH 3 DAYS Performed at [facility], [place] [address], [place], [place] [address]    Report Status PENDING  Incomplete  Resp Panel by RT-PCR (Flu  A&B, Covid) Nasal Mucosa     Status: None   Collection Time: 12/15/20  1:03 AM   Specimen: Nasal Mucosa; Nasopharyngeal(NP) swabs in vial transport medium  Result Value Ref Range Status   SARS Coronavirus 2 by RT PCR NEGATIVE NEGATIVE Final    Comment: (NOTE) SARS-CoV-2 target nucleic acids are NOT DETECTED.  The SARS-CoV-2 RNA is generally detectable in upper respiratory specimens during the acute phase of infection. The lowest concentration of SARS-CoV-2 viral copies this assay can detect is 138 copies/mL. A negative result does not preclude SARS-Cov-2 infection and should not be used as the sole basis for treatment or other patient management decisions. A negative result may occur with  improper specimen collection/handling, submission of specimen other than nasopharyngeal swab, presence of viral mutation(s) within the areas targeted by this assay, and inadequate number of viral copies(<138 copies/mL). A negative result must be combined with clinical observations, patient history, and epidemiological information. The expected result is Negative.  Fact Sheet for Patients:  EntrepreneurPulse.com.au  Fact Sheet for Healthcare Providers:  IncredibleEmployment.be  This test is no t yet approved or cleared by the [place] FDA and  has been authorized for detection and/or diagnosis of SARS-CoV-2 by FDA under an Emergency Use Authorization (EUA). This EUA will remain  in effect (meaning this test can be used) for the duration of the COVID-19 declaration under Section 564(b)(1) of the Act, 21 U.S.C.section 360bbb-3(b)(1), unless the authorization is terminated  or revoked sooner.       Influenza A by PCR NEGATIVE NEGATIVE Final  Influenza B by PCR NEGATIVE NEGATIVE Final    Comment: (NOTE) The Xpert Xpress SARS-CoV-2/FLU/RSV plus assay is intended as an aid in the diagnosis of influenza from Nasopharyngeal swab specimens and should not be  used as a sole basis for treatment. Nasal washings and aspirates are unacceptable for Xpert Xpress SARS-CoV-2/FLU/RSV testing.  Fact Sheet for Patients: EntrepreneurPulse.com.au  Fact Sheet for Healthcare Providers: IncredibleEmployment.be  This test is not yet approved or cleared by the [place] FDA and has been authorized for detection and/or diagnosis of SARS-CoV-2 by FDA under an Emergency Use Authorization (EUA). This EUA will remain in effect (meaning this test can be used) for the duration of the COVID-19 declaration under Section 564(b)(1) of the Act, 21 U.S.C. section 360bbb-3(b)(1), unless the authorization is terminated or revoked.  Performed at [facility], [place] [address] [place], [place] [address]      Time coordinating discharge: Over 30 minutes  SIGNED:   Darliss Cheney, MD  [facility] 12/18/2020, 9:47 AM  If 7PM-7AM, please contact night-coverage [email or web address]

## 2020-12-20 LAB — CULTURE, BLOOD (ROUTINE X 2)
Culture: NO GROWTH
Culture: NO GROWTH

## 2020-12-21 ENCOUNTER — Telehealth: Payer: Self-pay | Admitting: Cardiology

## 2020-12-21 NOTE — Telephone Encounter (Signed)
Patient informed. Copy sent to PCP °

## 2020-12-21 NOTE — Telephone Encounter (Signed)
Calling for stress test and EKG results

## 2020-12-21 NOTE — Telephone Encounter (Signed)
-----   Message from Arnoldo Lenis, MD sent at 12/13/2020  8:44 AM EDT ----- Normal stress test, no signs of any significant blockage in the heart or that his symptoms are heart related. Needs to f/u with pcp to discuss noncardiac causes of his symptoms, can f/u with [place] as needed.   Zandra Abts MD

## 2020-12-26 DIAGNOSIS — J90 Pleural effusion, not elsewhere classified: Secondary | ICD-10-CM | POA: Diagnosis not present

## 2020-12-26 DIAGNOSIS — R7303 Prediabetes: Secondary | ICD-10-CM | POA: Diagnosis not present

## 2021-01-03 ENCOUNTER — Other Ambulatory Visit: Payer: Self-pay | Admitting: Family Medicine

## 2021-01-03 ENCOUNTER — Ambulatory Visit
Admission: RE | Admit: 2021-01-03 | Discharge: 2021-01-03 | Disposition: A | Discharge status: Home / Self Care | Payer: Medicare Other | Source: Ambulatory Visit | Attending: Family Medicine | Admitting: Family Medicine

## 2021-01-03 DIAGNOSIS — R059 Cough, unspecified: Secondary | ICD-10-CM | POA: Diagnosis not present

## 2021-01-03 DIAGNOSIS — J189 Pneumonia, unspecified organism: Secondary | ICD-10-CM

## 2021-01-03 DIAGNOSIS — J90 Pleural effusion, not elsewhere classified: Secondary | ICD-10-CM | POA: Diagnosis not present

## 2021-01-10 DIAGNOSIS — R7303 Prediabetes: Secondary | ICD-10-CM | POA: Diagnosis not present

## 2021-01-10 DIAGNOSIS — E785 Hyperlipidemia, unspecified: Secondary | ICD-10-CM | POA: Diagnosis not present

## 2021-01-10 DIAGNOSIS — Z0001 Encounter for general adult medical examination with abnormal findings: Secondary | ICD-10-CM | POA: Diagnosis not present

## 2021-01-10 DIAGNOSIS — J90 Pleural effusion, not elsewhere classified: Secondary | ICD-10-CM | POA: Diagnosis not present

## 2021-01-12 ENCOUNTER — Encounter (HOSPITAL_COMMUNITY): Payer: Self-pay

## 2021-01-12 ENCOUNTER — Other Ambulatory Visit: Payer: Self-pay

## 2021-01-12 ENCOUNTER — Inpatient Hospital Stay (HOSPITAL_COMMUNITY)
Admission: EM | Admit: 2021-01-12 | Discharge: 2021-01-20 | DRG: 197 | Disposition: A | Discharge status: Home / Self Care | Payer: Medicare Other | Attending: Internal Medicine | Admitting: Internal Medicine

## 2021-01-12 ENCOUNTER — Emergency Department (HOSPITAL_COMMUNITY): Payer: Medicare Other

## 2021-01-12 DIAGNOSIS — K5730 Diverticulosis of large intestine without perforation or abscess without bleeding: Secondary | ICD-10-CM | POA: Diagnosis not present

## 2021-01-12 DIAGNOSIS — R000 Tachycardia, unspecified: Secondary | ICD-10-CM | POA: Diagnosis not present

## 2021-01-12 DIAGNOSIS — Z886 Allergy status to analgesic agent status: Secondary | ICD-10-CM

## 2021-01-12 DIAGNOSIS — R972 Elevated prostate specific antigen [PSA]: Secondary | ICD-10-CM | POA: Diagnosis present

## 2021-01-12 DIAGNOSIS — Z20822 Contact with and (suspected) exposure to covid-19: Secondary | ICD-10-CM | POA: Diagnosis present

## 2021-01-12 DIAGNOSIS — Z809 Family history of malignant neoplasm, unspecified: Secondary | ICD-10-CM

## 2021-01-12 DIAGNOSIS — Z79899 Other long term (current) drug therapy: Secondary | ICD-10-CM | POA: Diagnosis not present

## 2021-01-12 DIAGNOSIS — J189 Pneumonia, unspecified organism: Secondary | ICD-10-CM | POA: Diagnosis present

## 2021-01-12 DIAGNOSIS — N400 Enlarged prostate without lower urinary tract symptoms: Secondary | ICD-10-CM | POA: Diagnosis present

## 2021-01-12 DIAGNOSIS — N2889 Other specified disorders of kidney and ureter: Secondary | ICD-10-CM | POA: Diagnosis not present

## 2021-01-12 DIAGNOSIS — E440 Moderate protein-calorie malnutrition: Secondary | ICD-10-CM | POA: Diagnosis not present

## 2021-01-12 DIAGNOSIS — J811 Chronic pulmonary edema: Secondary | ICD-10-CM | POA: Diagnosis not present

## 2021-01-12 DIAGNOSIS — J95811 Postprocedural pneumothorax: Secondary | ICD-10-CM | POA: Diagnosis not present

## 2021-01-12 DIAGNOSIS — J90 Pleural effusion, not elsewhere classified: Secondary | ICD-10-CM | POA: Diagnosis present

## 2021-01-12 DIAGNOSIS — N3289 Other specified disorders of bladder: Secondary | ICD-10-CM | POA: Diagnosis not present

## 2021-01-12 DIAGNOSIS — D649 Anemia, unspecified: Secondary | ICD-10-CM | POA: Diagnosis not present

## 2021-01-12 DIAGNOSIS — Z87891 Personal history of nicotine dependence: Secondary | ICD-10-CM

## 2021-01-12 DIAGNOSIS — R61 Generalized hyperhidrosis: Secondary | ICD-10-CM | POA: Diagnosis not present

## 2021-01-12 DIAGNOSIS — N281 Cyst of kidney, acquired: Secondary | ICD-10-CM | POA: Diagnosis not present

## 2021-01-12 DIAGNOSIS — R918 Other nonspecific abnormal finding of lung field: Secondary | ICD-10-CM | POA: Diagnosis not present

## 2021-01-12 DIAGNOSIS — E785 Hyperlipidemia, unspecified: Secondary | ICD-10-CM | POA: Diagnosis present

## 2021-01-12 DIAGNOSIS — R042 Hemoptysis: Secondary | ICD-10-CM | POA: Diagnosis present

## 2021-01-12 DIAGNOSIS — J84116 Cryptogenic organizing pneumonia: Principal | ICD-10-CM

## 2021-01-12 DIAGNOSIS — D638 Anemia in other chronic diseases classified elsewhere: Secondary | ICD-10-CM | POA: Diagnosis present

## 2021-01-12 DIAGNOSIS — Z8249 Family history of ischemic heart disease and other diseases of the circulatory system: Secondary | ICD-10-CM

## 2021-01-12 DIAGNOSIS — R222 Localized swelling, mass and lump, trunk: Secondary | ICD-10-CM | POA: Diagnosis not present

## 2021-01-12 DIAGNOSIS — R64 Cachexia: Secondary | ICD-10-CM | POA: Diagnosis not present

## 2021-01-12 DIAGNOSIS — R911 Solitary pulmonary nodule: Secondary | ICD-10-CM | POA: Diagnosis not present

## 2021-01-12 DIAGNOSIS — E871 Hypo-osmolality and hyponatremia: Secondary | ICD-10-CM | POA: Diagnosis present

## 2021-01-12 DIAGNOSIS — J939 Pneumothorax, unspecified: Secondary | ICD-10-CM | POA: Diagnosis not present

## 2021-01-12 DIAGNOSIS — J9811 Atelectasis: Secondary | ICD-10-CM | POA: Diagnosis not present

## 2021-01-12 DIAGNOSIS — J8489 Other specified interstitial pulmonary diseases: Secondary | ICD-10-CM | POA: Diagnosis not present

## 2021-01-12 DIAGNOSIS — R0602 Shortness of breath: Secondary | ICD-10-CM | POA: Diagnosis not present

## 2021-01-12 DIAGNOSIS — M898X8 Other specified disorders of bone, other site: Secondary | ICD-10-CM | POA: Diagnosis not present

## 2021-01-12 DIAGNOSIS — C7951 Secondary malignant neoplasm of bone: Secondary | ICD-10-CM | POA: Diagnosis not present

## 2021-01-12 DIAGNOSIS — Z9049 Acquired absence of other specified parts of digestive tract: Secondary | ICD-10-CM | POA: Diagnosis not present

## 2021-01-12 DIAGNOSIS — Z6826 Body mass index (BMI) 26.0-26.9, adult: Secondary | ICD-10-CM

## 2021-01-12 DIAGNOSIS — J9601 Acute respiratory failure with hypoxia: Secondary | ICD-10-CM | POA: Diagnosis not present

## 2021-01-12 LAB — CBC WITH DIFFERENTIAL/PLATELET
Abs Immature Granulocytes: 0.22 10*3/uL — ABNORMAL HIGH (ref 0.00–0.07)
Basophils Absolute: 0.1 10*3/uL (ref 0.0–0.1)
Basophils Relative: 0 %
Eosinophils Absolute: 0 10*3/uL (ref 0.0–0.5)
Eosinophils Relative: 0 %
HCT: 33.8 % — ABNORMAL LOW (ref 39.0–52.0)
Hemoglobin: 10.5 g/dL — ABNORMAL LOW (ref 13.0–17.0)
Immature Granulocytes: 1 %
Lymphocytes Relative: 15 %
Lymphs Abs: 3.7 10*3/uL (ref 0.7–4.0)
MCH: 28.9 pg (ref 26.0–34.0)
MCHC: 31.1 g/dL (ref 30.0–36.0)
MCV: 93.1 fL (ref 80.0–100.0)
Monocytes Absolute: 2 10*3/uL — ABNORMAL HIGH (ref 0.1–1.0)
Monocytes Relative: 8 %
Neutro Abs: 19.1 10*3/uL — ABNORMAL HIGH (ref 1.7–7.7)
Neutrophils Relative %: 76 %
Platelets: 474 10*3/uL — ABNORMAL HIGH (ref 150–400)
RBC: 3.63 MIL/uL — ABNORMAL LOW (ref 4.22–5.81)
RDW: 14 % (ref 11.5–15.5)
WBC: 25 10*3/uL — ABNORMAL HIGH (ref 4.0–10.5)
nRBC: 0 % (ref 0.0–0.2)

## 2021-01-12 LAB — BASIC METABOLIC PANEL
Anion gap: 9 (ref 5–15)
BUN: 9 mg/dL (ref 8–23)
CO2: 25 mmol/L (ref 22–32)
Calcium: 9.1 mg/dL (ref 8.9–10.3)
Chloride: 99 mmol/L (ref 98–111)
Creatinine, Ser: 0.94 mg/dL (ref 0.61–1.24)
GFR, Estimated: >60 mL/min (ref >60)
Glucose, Bld: 107 mg/dL — ABNORMAL HIGH (ref 70–99)
Potassium: 4.3 mmol/L (ref 3.5–5.1)
Sodium: 133 mmol/L — ABNORMAL LOW (ref 135–145)

## 2021-01-12 LAB — BRAIN NATRIURETIC PEPTIDE: B Natriuretic Peptide: 196.2 pg/mL — ABNORMAL HIGH (ref 0.0–100.0)

## 2021-01-12 NOTE — ED Provider Notes (Signed)
Emergency Medicine Provider Triage Evaluation Note  Brett Bean , a 75 y.o. male  was evaluated in triage.  Pt complains of shortness of breath.  Patient was admitted for pneumonia few weeks ago, currently taking Augmentin.  He has been feeling short of breath and has not improved since leaving the hospital per the patient and his wife.  Denies any current chest pain, does endorse streaks of bright red blood in cough sputum intermittently.  Denies any frank hemoptysis.  Review of Systems  Positive: Shortness of breath, cough Negative: cp  Physical Exam  BP 127/71 (BP Location: Right Arm)   Pulse 100   Temp 98.6 F (37 C) (Oral)   Resp (!) 22   Ht 5\' 8"  (1.727 m)   Wt 80 kg   SpO2 97%   BMI 26.82 kg/m  Gen:   Awake, no distress   Resp:  Normal effort mild tachypnea MSK:   Moves extremities without difficulty  Other:    Medical Decision Making  Medically screening exam initiated at 4:02 PM.  Appropriate orders placed.  Brett Bean was informed that the remainder of the evaluation will be completed by another provider, this initial triage assessment does not replace that evaluation, and the importance of remaining in the ED until their evaluation is complete.  Shortness of breath work-up, will repeat chest x-ray   Sherrill Raring, PA-C 01/12/21 1603    Daleen Bo, MD 01/12/21 [phone]

## 2021-01-12 NOTE — ED Triage Notes (Signed)
Pt arrives POV for eval of chills and malaise x 2 weeks. Reports that he was admitted for pneumonia about 2-3 weeks ago, and has been feeling badly since that time.

## 2021-01-13 ENCOUNTER — Emergency Department (HOSPITAL_COMMUNITY): Payer: Medicare Other

## 2021-01-13 ENCOUNTER — Other Ambulatory Visit: Payer: Self-pay

## 2021-01-13 DIAGNOSIS — Z20822 Contact with and (suspected) exposure to covid-19: Secondary | ICD-10-CM | POA: Diagnosis present

## 2021-01-13 DIAGNOSIS — N400 Enlarged prostate without lower urinary tract symptoms: Secondary | ICD-10-CM | POA: Diagnosis present

## 2021-01-13 DIAGNOSIS — J189 Pneumonia, unspecified organism: Secondary | ICD-10-CM

## 2021-01-13 DIAGNOSIS — D638 Anemia in other chronic diseases classified elsewhere: Secondary | ICD-10-CM | POA: Diagnosis present

## 2021-01-13 DIAGNOSIS — Z87891 Personal history of nicotine dependence: Secondary | ICD-10-CM | POA: Diagnosis not present

## 2021-01-13 DIAGNOSIS — D649 Anemia, unspecified: Secondary | ICD-10-CM

## 2021-01-13 DIAGNOSIS — Z809 Family history of malignant neoplasm, unspecified: Secondary | ICD-10-CM | POA: Diagnosis not present

## 2021-01-13 DIAGNOSIS — Z79899 Other long term (current) drug therapy: Secondary | ICD-10-CM | POA: Diagnosis not present

## 2021-01-13 DIAGNOSIS — Z6826 Body mass index (BMI) 26.0-26.9, adult: Secondary | ICD-10-CM | POA: Diagnosis not present

## 2021-01-13 DIAGNOSIS — E785 Hyperlipidemia, unspecified: Secondary | ICD-10-CM | POA: Diagnosis present

## 2021-01-13 DIAGNOSIS — J95811 Postprocedural pneumothorax: Secondary | ICD-10-CM | POA: Diagnosis not present

## 2021-01-13 DIAGNOSIS — J90 Pleural effusion, not elsewhere classified: Secondary | ICD-10-CM | POA: Diagnosis present

## 2021-01-13 DIAGNOSIS — R918 Other nonspecific abnormal finding of lung field: Secondary | ICD-10-CM | POA: Diagnosis not present

## 2021-01-13 DIAGNOSIS — J8489 Other specified interstitial pulmonary diseases: Secondary | ICD-10-CM | POA: Diagnosis not present

## 2021-01-13 DIAGNOSIS — Z8249 Family history of ischemic heart disease and other diseases of the circulatory system: Secondary | ICD-10-CM | POA: Diagnosis not present

## 2021-01-13 DIAGNOSIS — J84116 Cryptogenic organizing pneumonia: Secondary | ICD-10-CM | POA: Diagnosis present

## 2021-01-13 DIAGNOSIS — N281 Cyst of kidney, acquired: Secondary | ICD-10-CM | POA: Diagnosis present

## 2021-01-13 DIAGNOSIS — R64 Cachexia: Secondary | ICD-10-CM | POA: Diagnosis present

## 2021-01-13 DIAGNOSIS — R972 Elevated prostate specific antigen [PSA]: Secondary | ICD-10-CM | POA: Diagnosis present

## 2021-01-13 DIAGNOSIS — E440 Moderate protein-calorie malnutrition: Secondary | ICD-10-CM | POA: Diagnosis present

## 2021-01-13 DIAGNOSIS — R61 Generalized hyperhidrosis: Secondary | ICD-10-CM | POA: Diagnosis not present

## 2021-01-13 DIAGNOSIS — R042 Hemoptysis: Secondary | ICD-10-CM | POA: Diagnosis present

## 2021-01-13 DIAGNOSIS — Z886 Allergy status to analgesic agent status: Secondary | ICD-10-CM | POA: Diagnosis not present

## 2021-01-13 DIAGNOSIS — E871 Hypo-osmolality and hyponatremia: Secondary | ICD-10-CM | POA: Diagnosis present

## 2021-01-13 LAB — RESP PANEL BY RT-PCR (FLU A&B, COVID) ARPGX2
Influenza A by PCR: NEGATIVE
Influenza B by PCR: NEGATIVE
SARS Coronavirus 2 by RT PCR: NEGATIVE

## 2021-01-13 LAB — EXPECTORATED SPUTUM ASSESSMENT W GRAM STAIN, RFLX TO RESP C

## 2021-01-13 LAB — LACTIC ACID, PLASMA: Lactic Acid, Venous: 1 mmol/L (ref 0.5–1.9)

## 2021-01-13 LAB — MRSA NEXT GEN BY PCR, NASAL: MRSA by PCR Next Gen: NOT DETECTED

## 2021-01-13 LAB — HIV ANTIBODY (ROUTINE TESTING W REFLEX): HIV Screen 4th Generation wRfx: NONREACTIVE

## 2021-01-13 MED ORDER — SODIUM CHLORIDE 0.9 % IV SOLN
500.0000 mg | INTRAVENOUS | Status: AC
  Administered 2021-01-13 – 2021-01-17 (×5): 500 mg via INTRAVENOUS
  Filled 2021-01-13 (×5): qty 500

## 2021-01-13 MED ORDER — VANCOMYCIN HCL 1500 MG/300ML IV SOLN
1500.0000 mg | Freq: Once | INTRAVENOUS | Status: AC
  Administered 2021-01-13: 1500 mg via INTRAVENOUS
  Filled 2021-01-13: qty 300

## 2021-01-13 MED ORDER — SODIUM CHLORIDE 0.9 % IV SOLN: INTRAVENOUS | Status: AC

## 2021-01-13 MED ORDER — VANCOMYCIN HCL 1000 MG/200ML IV SOLN: 1000.0000 mg | Freq: Once | INTRAVENOUS | Status: DC

## 2021-01-13 MED ORDER — ACETAMINOPHEN 325 MG PO TABS
650.0000 mg | ORAL_TABLET | Freq: Once | ORAL | Status: AC
  Administered 2021-01-13: 650 mg via ORAL
  Filled 2021-01-13: qty 2

## 2021-01-13 MED ORDER — ACETAMINOPHEN 650 MG RE SUPP: 650.0000 mg | Freq: Four times a day (QID) | RECTAL | Status: DC | PRN

## 2021-01-13 MED ORDER — ACETAMINOPHEN 325 MG PO TABS
650.0000 mg | ORAL_TABLET | Freq: Four times a day (QID) | ORAL | Status: DC | PRN
  Administered 2021-01-13 – 2021-01-16 (×2): 650 mg via ORAL
  Filled 2021-01-13 (×2): qty 2

## 2021-01-13 MED ORDER — ADULT MULTIVITAMIN W/MINERALS CH
1.0000 | ORAL_TABLET | Freq: Every day | ORAL | Status: DC
  Administered 2021-01-13 – 2021-01-20 (×8): 1 via ORAL
  Filled 2021-01-13 (×8): qty 1

## 2021-01-13 MED ORDER — SODIUM CHLORIDE 0.9 % IV SOLN
2.0000 g | Freq: Once | INTRAVENOUS | Status: AC
  Administered 2021-01-13: 2 g via INTRAVENOUS
  Filled 2021-01-13: qty 2

## 2021-01-13 MED ORDER — SODIUM CHLORIDE 0.9 % IV SOLN
2.0000 g | Freq: Three times a day (TID) | INTRAVENOUS | Status: DC
  Administered 2021-01-13 – 2021-01-18 (×16): 2 g via INTRAVENOUS
  Filled 2021-01-13 (×16): qty 2

## 2021-01-13 MED ORDER — VANCOMYCIN HCL 1500 MG/300ML IV SOLN
1500.0000 mg | INTRAVENOUS | Status: DC
  Administered 2021-01-14: 1500 mg via INTRAVENOUS
  Filled 2021-01-13: qty 300

## 2021-01-13 MED ORDER — SODIUM CHLORIDE 0.9 % IV SOLN: INTRAVENOUS | Status: DC

## 2021-01-13 NOTE — ED Notes (Signed)
Admit provider sent a message in reference to pt receiving fluid bolus for sepsis protocol at this time

## 2021-01-13 NOTE — Progress Notes (Signed)
Initial Nutrition Assessment  DOCUMENTATION CODES:   Non-severe (moderate) malnutrition in context of acute illness/injury  INTERVENTION:   Mighty Shake II TID with meals, each supplement provides 480-500 kcals and 20-23 grams of protein. MVI with minerals daily.  NUTRITION DIAGNOSIS:   Moderate Malnutrition related to acute illness (CAP) as evidenced by mild muscle depletion, mild fat depletion.  GOAL:   Patient will meet greater than or equal to 90% of their needs  MONITOR:   PO intake, Supplement acceptance  REASON FOR ASSESSMENT:   Malnutrition Screening Tool    ASSESSMENT:   75 yo male admitted with CAP, normochromic normocytic anemia. PMH includes HLD, elevated PSA, recent PNA.  Per H&P, patient reports 20 lb weight loss over the past month. Current weight 80 kg (suspect this is an estimated or stated weight). Weight from 10/13 79.6 kg, used bed scale to weigh patient, 76.1 kg. 4% weight loss over the past month, not significant for the time frame.   Patient reports weight loss and overall not eating well for the past few weeks/month. He does not tolerate Ensure or Boost and he did not like the Boost Breeze supplement. He agreed to try mighty shakes with meals.   Labs reviewed. Na 133 Medications reviewed.   NUTRITION - FOCUSED PHYSICAL EXAM:  Flowsheet Row Most Recent Value  Orbital Region No depletion  Upper Arm Region Mild depletion  Thoracic and Lumbar Region Mild depletion  Buccal Region No depletion  Temple Region No depletion  Clavicle Bone Region Mild depletion  Clavicle and Acromion Bone Region Mild depletion  Scapular Bone Region No depletion  Dorsal Hand Mild depletion  Patellar Region Mild depletion  Anterior Thigh Region Mild depletion  Posterior Calf Region Moderate depletion  Edema (RD Assessment) None  Hair Reviewed  Eyes Reviewed  Mouth Reviewed  Skin Reviewed  Nails Reviewed       Diet Order:   Diet Order             Diet  regular Room service appropriate? Yes; Fluid consistency: Thin  Diet effective now                   EDUCATION NEEDS:   Not appropriate for education at this time  Skin:  Skin Assessment: Reviewed RN Assessment  Last BM:  no BM documented  Height:   Ht Readings from Last 1 Encounters:  01/12/21 5\' 8"  (1.727 m)    Weight:   Wt Readings from Last 1 Encounters:  01/13/21 76.1 kg    BMI:  Body mass index is 25.51 kg/m.  Estimated Nutritional Needs:   Kcal:  2000-2200  Protein:  100-115 gm  Fluid:  2-2.2 L    Lucas Mallow, RD, LDN, CNSC Please refer to Amion for contact information.

## 2021-01-13 NOTE — Consult Note (Signed)
NAME:  Brett Bean, MRN:  [id number], DOB:  Oct 12, 1945, LOS: 0 ADMISSION DATE:  01/12/2021, CONSULTATION DATE:  01/13/2021 REFERRING MD:  Dr. Hal Hope, [facility], CHIEF COMPLAINT:  Chest pain   History of Present Illness:  75 yo male with remote history of smoking was in hospital from 12/14/20 to 12/18/20.  He presented with chest pain and fever 101F associated with productive cough with episodes of hemoptysis.  Found to have Rt sided infiltrate and pleural effusion with loculated components.  He was started on antibiotics and had assessment by thoracic surgery.  Repeat CT chest showed free flowing fluid and chest tube placement was deferred.  He was discharged home on augmentin, and took his last dose on 01/12/21.  He initially felt improvement in his symptoms after hospital discharge, but then started developing intermittent fever again up to 102F.  This has been associated with sweats.  He has lost about 25 lbs over the past 1 month.  He is coughing more sputum, and has yellow-green appearance with intermittent episodes of hemoptysis.  He has recurrence of right sided pleuritic type chest pain.  This prompted his return to the ER.  He quit smoking 40 yrs ago.  No prior history of pneumonia or exposure to tuberculosis.  He is from [place].  Was never in the [employer]  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  He denies symptoms of aspiration or reflux, or dental issues (he has upper dentures).  Pertinent  Medical History  HLD  Studies:  CT angio chest 12/15/20 >> moderate ATX/infiltrate in RUL/RML/RLL, mod to large Rt pleural effusion with loculated components, elevated of Rt hemidiaphragm, small hiatal hernia CT chest 12/16/20 >> small Rt pleural effusion is free flowing CT chest 01/13/21 >> 10.2 x 6.6 x 6.5 cm bilobed mass along inferior RML and RLL (was 10.6 x 8.2 x 6.2 cm), new patchy nodular opacity medial RUL, new 10 mm anterior LUL nodule, new 2.7 cm nodule in medial LUL, small Rt  effusion, new 15 mm lesion along anterior Lt upper kidney  Interim History / Subjective:  Has cough with sputum mixed with blood.  Objective   Blood pressure 113/75, pulse 100, temperature 98.5 F (36.9 C), temperature source Oral, resp. rate (!) 26, height 5\' 8"  (1.727 m), weight 80 kg, SpO2 96 %.        Intake/Output Summary (Last 24 hours) at 01/13/2021 0858 Last data filed at 01/13/2021 8676 Gross per 24 hour  Intake 490 ml  Output --  Net 490 ml   Filed Weights   01/12/21 1559  Weight: 80 kg    Examination:  General - alert Eyes - pupils reactive ENT - no sinus tenderness, no stridor Cardiac - regular rate/rhythm, no murmur Chest - decreased BS at Rt base with crackles in upper lung fields Rt > Lt Abdomen - soft, non tender, + bowel sounds Extremities - no cyanosis, clubbing, or edema Skin - no rashes Neuro - normal strength, moves extremities, follows commands Psych - normal mood and behavior Lymphatics - no lymphadenopathy  Discussion:  He has persistent cough with sputum, intermittent hemoptysis, fever up to 102F, intermittent sweats, right pleuritic chest pain, and weight loss.  This is associated with leukocytosis.  Main concern is still for atypical infectious process, but malignancy still in the differential.  Assessment & Plan:   Non resolving pulmonary infiltrates with concern for atypical pneumonia versus malignancy. - day 1 of vancomycin, cefepime, zithromax - f/u blood and sputum cultures from  01/13/21 - check HIV and quantiferon gold - f/u chest xray  - if no clinical or radiographic improvement over the next few days, he would then need bronchoscopy  15 mm lesion anterior Lt upper kidney. - uncertain what significance of this is - per primary team  Updated pt's family at bedside.  Labs   CBC: Recent Labs  Lab 01/12/21 1603  WBC 25.0*  NEUTROABS 19.1*  HGB 10.5*  HCT 33.8*  MCV 93.1  PLT 474*    Basic Metabolic Panel: Recent Labs   Lab 01/12/21 1603  NA 133*  K 4.3  CL 99  CO2 25  GLUCOSE 107*  BUN 9  CREATININE 0.94  CALCIUM 9.1   GFR: Estimated Creatinine Clearance: 63.7 mL/min (by C-G formula based on SCr of 0.94 mg/dL). Recent Labs  Lab 01/12/21 1603 01/13/21 0334  WBC 25.0*  --   LATICACIDVEN  --  1.0    Liver Function Tests: No results for input(s): AST, ALT, ALKPHOS, BILITOT, PROT, ALBUMIN in the last 168 hours. No results for input(s): LIPASE, AMYLASE in the last 168 hours. No results for input(s): AMMONIA in the last 168 hours.  ABG No results found for: PHART, PCO2ART, PO2ART, HCO3, TCO2, ACIDBASEDEF, O2SAT   Coagulation Profile: No results for input(s): INR, PROTIME in the last 168 hours.  Cardiac Enzymes: No results for input(s): CKTOTAL, CKMB, CKMBINDEX, TROPONINI in the last 168 hours.  HbA1C: No results found for: HGBA1C  CBG: No results for input(s): GLUCAP in the last 168 hours.  Review of Systems:   Reviewed and negative.  Past Medical History:  He,  has a past medical history of Hyperlipidemia.   Surgical History:   Past Surgical History:  Procedure Laterality Date   NO PAST SURGERIES       Social History:   reports that he has quit smoking. His smoking use included cigarettes. He has never used smokeless tobacco. He reports that he does not drink alcohol and does not use drugs.   Family History:  His family history includes Cancer in his mother; Heart disease in his father; Heart failure in his father; Heart murmur in his father; Hypertension in his sister.   Allergies Allergies  Allergen Reactions   Asa [Aspirin] Nausea And Vomiting     Home Medications  Prior to Admission medications   Medication Sig Start Date End Date Taking? Authorizing Provider  acetaminophen (TYLENOL) 500 MG tablet Take 1,000 mg by mouth every 6 (six) hours as needed for moderate pain or fever.   Yes [provider]  amoxicillin-clavulanate (AUGMENTIN) 875-125 MG tablet  Take 1 tablet by mouth 2 (two) times daily for 28 days. 12/18/20 01/15/21 Yes Pahwani, Einar Grad, MD  cholecalciferol (VITAMIN D) 25 MCG (1000 UNIT) tablet Take 1,000 Units by mouth daily.   Yes [provider]  Lactobacillus (PROBIOTIC ACIDOPHILUS) CAPS Take 1 capsule by mouth daily.   Yes [provider]  Misc Natural Products (PROSTATE) CAPS Take 1 capsule by mouth in the morning and at bedtime. supplement   Yes [provider]  Multiple Vitamins-Minerals (EYE VITAMINS PO) Take 1 tablet by mouth daily. supplement   Yes [provider]     Signature:  Chesley Mires, MD [place] Pager - [phone] 01/13/2021, 9:26 AM

## 2021-01-13 NOTE — ED Notes (Signed)
Provider at bedside

## 2021-01-13 NOTE — Care Plan (Signed)
This 75 years old male with PMH significant for hyperlipidemia, elevated PSA being followed by primary care physician admitted last month for pneumonia and discharged on Augmentin.  Patient has been having subjective feeling of fever associated with chills and productive cough at some time hemoptysis and weight loss of 20 pounds over last 1 month.  Patient is admitted for multifocal pneumonia,  started on antibiotics.  CT chest showed lung mass.  Pulmonology was consulted recommended to continue antibiotics and repeat imaging in 3 to 4 days to see if there is any improvement.  if there is no improvement Patient might require bronchoscopy and lung biopsy.  Patient was seen and examined at bedside.  He reports feeling better.

## 2021-01-13 NOTE — ED Notes (Signed)
Admit provider at bedside 

## 2021-01-13 NOTE — ED Notes (Signed)
Spoke to charge nurse advised to call rapid response. Rapid response contacted made aware of pt and VS and condition. Advised pt able to go to the floor. Contacted the floor and was asked if the provider was aware and if there was any parameters and if I sent him up this RN and ER would be safety zoned. Charge nurse called and made aware. Admit provider paged at this time

## 2021-01-13 NOTE — H&P (Signed)
History and Physical    Brett Bean EHU:[id number] DOB: [birth year] DOA: 01/12/2021  PCP: Iona Beard, MD  Patient coming from: Home.  Chief Complaint: Fever and chills.  HPI: Brett Bean is a 75 y.o. male with history of hyperlipidemia elevated PSA being followed by patient's primary care physician was admitted last month for pneumonia discharged on home Augmentin has been having increasing subjective feeling of fever and chills and productive cough at times hemoptysis and weight loss of at least 20 pounds over the last 1 month.  Since patient's symptoms have been persistent patient presents to the ER again.  Denies any recent travel or sick contacts.  ED Course: In the ER patient's labs show significant leukocytosis of 25,000 with hemoglobin of 10.5 CT scan showing persistent bilobed mass in the right side of the lung which has improved but still concerning for possible pneumonia but bronchogenic mass/neoplasia not ruled out.  There is also multifocal patchy opacities concerning for infection versus metastatic disease.  And there is also persistence of the right pleural effusion.  There is also a lesion in the left anterior kidney which will need further work-up.  Patient had blood cultures drawn and started on IV antibiotic admitted for further work-up.  COVID test negative.  EKG shows sinus tachycardia.  Review of Systems: As per HPI, rest all negative.   Past Medical History:  Diagnosis Date   Hyperlipidemia     Past Surgical History:  Procedure Laterality Date   NO PAST SURGERIES       reports that he has quit smoking. His smoking use included cigarettes. He has never used smokeless tobacco. He reports that he does not drink alcohol and does not use drugs.  Allergies  Allergen Reactions   Asa [Aspirin] Nausea And Vomiting    Family History  Problem Relation Age of Onset   Cancer Mother    Heart disease Father    Heart failure Father    Heart murmur Father     Hypertension Sister     Prior to Admission medications   Medication Sig Start Date End Date Taking? Authorizing Provider  acetaminophen (TYLENOL) 500 MG tablet Take 1,000 mg by mouth every 6 (six) hours as needed for moderate pain or fever.   Yes [provider]  amoxicillin-clavulanate (AUGMENTIN) 875-125 MG tablet Take 1 tablet by mouth 2 (two) times daily for 28 days. 12/18/20 01/15/21 Yes Pahwani, Einar Grad, MD  cholecalciferol (VITAMIN D) 25 MCG (1000 UNIT) tablet Take 1,000 Units by mouth daily.   Yes [provider]  Lactobacillus (PROBIOTIC ACIDOPHILUS) CAPS Take 1 capsule by mouth daily.   Yes [provider]  Misc Natural Products (PROSTATE) CAPS Take 1 capsule by mouth in the morning and at bedtime. supplement   Yes [provider]  Multiple Vitamins-Minerals (EYE VITAMINS PO) Take 1 tablet by mouth daily. supplement   Yes [provider]    Physical Exam: Constitutional: Moderately built and nourished. Vitals:   01/13/21 0145 01/13/21 0145 01/13/21 0200 01/13/21 0245  BP: 106/63  119/82 110/75  Pulse: (!) 101  100 98  Resp: (!) 33  (!) 23 (!) 28  Temp:  99.7 F (37.6 C)    TempSrc:  Oral    SpO2: 95%  94% 96%  Weight:      Height:       Eyes: Anicteric no pallor. ENMT: No discharge from the ears eyes nose and mouth. Neck: No mass felt.  No neck rigidity. Respiratory:  No rhonchi or crepitations. Cardiovascular: S1-S2 heard. Abdomen: Soft nontender bowel sound present. Musculoskeletal: No edema. Skin: No rash. Neurologic: Alert awake oriented to time place and person.  Moves all extremities. Psychiatric: Appears normal.  Normal affect.   Labs on Admission: I have personally reviewed following labs and imaging studies  CBC: Recent Labs  Lab 01/12/21 1603  WBC 25.0*  NEUTROABS 19.1*  HGB 10.5*  HCT 33.8*  MCV 93.1  PLT 308*   Basic Metabolic Panel: Recent Labs  Lab 01/12/21 1603  NA 133*  K 4.3  CL 99  CO2 25   GLUCOSE 107*  BUN 9  CREATININE 0.94  CALCIUM 9.1   GFR: Estimated Creatinine Clearance: 63.7 mL/min (by C-G formula based on SCr of 0.94 mg/dL). Liver Function Tests: No results for input(s): AST, ALT, ALKPHOS, BILITOT, PROT, ALBUMIN in the last 168 hours. No results for input(s): LIPASE, AMYLASE in the last 168 hours. No results for input(s): AMMONIA in the last 168 hours. Coagulation Profile: No results for input(s): INR, PROTIME in the last 168 hours. Cardiac Enzymes: No results for input(s): CKTOTAL, CKMB, CKMBINDEX, TROPONINI in the last 168 hours. BNP (last 3 results) No results for input(s): PROBNP in the last 8760 hours. HbA1C: No results for input(s): HGBA1C in the last 72 hours. CBG: No results for input(s): GLUCAP in the last 168 hours. Lipid Profile: No results for input(s): CHOL, HDL, LDLCALC, TRIG, CHOLHDL, LDLDIRECT in the last 72 hours. Thyroid Function Tests: No results for input(s): TSH, T4TOTAL, FREET4, T3FREE, THYROIDAB in the last 72 hours. Anemia Panel: No results for input(s): VITAMINB12, FOLATE, FERRITIN, TIBC, IRON, RETICCTPCT in the last 72 hours. Urine analysis: No results found for: COLORURINE, APPEARANCEUR, LABSPEC, PHURINE, GLUCOSEU, HGBUR, BILIRUBINUR, KETONESUR, PROTEINUR, UROBILINOGEN, NITRITE, LEUKOCYTESUR Sepsis Labs: @LABRCNTIP (procalcitonin:4,lacticidven:4) ) Recent Results (from the past 240 hour(s))  Resp Panel by RT-PCR (Flu A&B, Covid) Nasopharyngeal Swab     Status: None   Collection Time: 01/13/21  1:18 AM   Specimen: Nasopharyngeal Swab; Nasopharyngeal(NP) swabs in vial transport medium  Result Value Ref Range Status   SARS Coronavirus 2 by RT PCR NEGATIVE NEGATIVE Final    Comment: (NOTE) SARS-CoV-2 target nucleic acids are NOT DETECTED.  The SARS-CoV-2 RNA is generally detectable in upper respiratory specimens during the acute phase of infection. The lowest concentration of SARS-CoV-2 viral copies this assay can detect  is 138 copies/mL. A negative result does not preclude SARS-Cov-2 infection and should not be used as the sole basis for treatment or other patient management decisions. A negative result may occur with  improper specimen collection/handling, submission of specimen other than nasopharyngeal swab, presence of viral mutation(s) within the areas targeted by this assay, and inadequate number of viral copies(<138 copies/mL). A negative result must be combined with clinical observations, patient history, and epidemiological information. The expected result is Negative.  Fact Sheet for Patients:  EntrepreneurPulse.com.au  Fact Sheet for Healthcare Providers:  IncredibleEmployment.be  This test is no t yet approved or cleared by the [place] FDA and  has been authorized for detection and/or diagnosis of SARS-CoV-2 by FDA under an Emergency Use Authorization (EUA). This EUA will remain  in effect (meaning this test can be used) for the duration of the COVID-19 declaration under Section 564(b)(1) of the Act, 21 U.S.C.section 360bbb-3(b)(1), unless the authorization is terminated  or revoked sooner.       Influenza A by PCR NEGATIVE NEGATIVE Final   Influenza B by PCR NEGATIVE NEGATIVE Final  Comment: (NOTE) The Xpert Xpress SARS-CoV-2/FLU/RSV plus assay is intended as an aid in the diagnosis of influenza from Nasopharyngeal swab specimens and should not be used as a sole basis for treatment. Nasal washings and aspirates are unacceptable for Xpert Xpress SARS-CoV-2/FLU/RSV testing.  Fact Sheet for Patients: EntrepreneurPulse.com.au  Fact Sheet for Healthcare Providers: IncredibleEmployment.be  This test is not yet approved or cleared by the [place] FDA and has been authorized for detection and/or diagnosis of SARS-CoV-2 by FDA under an Emergency Use Authorization (EUA). This EUA will remain in effect  (meaning this test can be used) for the duration of the COVID-19 declaration under Section 564(b)(1) of the Act, 21 U.S.C. section 360bbb-3(b)(1), unless the authorization is terminated or revoked.  Performed at [facility], [place] [address], [place], [place] [address]      Radiological Exams on Admission: DG Chest 2 View  Result Date: 01/12/2021 CLINICAL DATA:  Shortness of breath EXAM: CHEST - 2 VIEW COMPARISON:  Radiograph 01/03/2021 FINDINGS: Unchanged cardiomediastinal silhouette. Persistent right basilar opacities and small right pleural effusion. Left lung is clear. No visible pneumothorax. No acute osseous abnormalities. IMPRESSION: Persistent right basilar consolidation and small right pleural effusion. Electronically Signed   By: Maurine Simmering M.D.   On: 01/12/2021 16:33   CT Chest Wo Contrast  Result Date: 01/13/2021 CLINICAL DATA:  Pneumonia, unresolved EXAM: CT CHEST WITHOUT CONTRAST TECHNIQUE: Multidetector CT imaging of the chest was performed following the standard protocol without IV contrast. COMPARISON:  Chest radiographs dated 01/12/2021. CTA chest dated 12/14/2020. FINDINGS: Cardiovascular: The heart is normal in size. No pericardial effusion. No evidence of thoracic aortic aneurysm. Mediastinum/Nodes: Small mediastinal lymph nodes, including a dominant 18 mm short axis subcarinal node (series 3/image 86). Visualized thyroid is unremarkable. Lungs/Pleura: 10.2 x 6.6 x 6.5 cm bilobed mass along the inferior right middle and lower lobes (series 3/image 96), previously 10.6 x 8.2 x 6.2 cm. Given the slight decrease in measuring size, this could still reflect severe infection/pneumonia, but primary bronchogenic neoplasm is certainly not excluded. New patchy/nodular opacity in the medial right upper lobe (series 4/image 59). New 10 mm nodule in the anterior left upper lobe (series 4/image 77). New 2.7 cm nodule in the medial left upper lobe/perihilar region (series 4/image 90).  Differential consideration includes multifocal infection versus metastatic disease. Associated small right pleural effusion, unchanged. No pneumothorax. Upper Abdomen: New 15 mm lesion along the anterior left upper kidney (series 3/image 172), not clearly evident on recent CT. 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 Suspected vertebral hemangioma along the anterior aspect of T11. IMPRESSION: 10.2 cm bilobed masslike opacity along the inferior right middle and lower lobes, possibly mildly improved from the prior. While this may reflect severe infection/pneumonia, given persistence, primary bronchogenic neoplasm is not excluded. Bronchoscopy is suggested. New multifocal patchy/nodular opacities in the lungs bilaterally, possibly reflecting multifocal infection versus metastatic disease. Associated mediastinal lymphadenopathy, including an 18 mm short axis subcarinal node. Small right pleural effusion, unchanged. New 15 mm lesion in the anterior left upper kidney, poorly evaluated on unenhanced CT. If this patient has a documented pulmonary malignancy, this can be evaluated at the time of staging of the abdomen/pelvis. Electronically Signed   By: Julian Hy M.D.   On: 01/13/2021 02:46    EKG: Independently reviewed.  Sinus tachycardia.  Assessment/Plan Principal Problem:   CAP (community acquired pneumonia) Active Problems:   Normochromic normocytic anemia    Pneumonia -patient has been empirically placed on antibiotics for pneumonia at  this time vancomycin cefepime and Zithromax.  We will get sputum cultures.  There is also concern for possible bronchogenic mass with possible metastatic lesions for which we will consult pulmonary critical care for possible bronchoscope. Normocytic normochromic anemia has been persistent since last admission.  Follow CBC check anemia panel with next blood draw. History of elevated PSA being followed by primary care physician.  Since patient has persistent  symptoms of pneumonia and at this time CT scan showing new findings for bilateral infiltrates concerning for worsening infection versus metastatic disease will need further work-up and inpatient status.   DVT prophylaxis: SCDs.  Avoiding anticoagulation anticipation of possible procedures for the lung lesions and also patient is having hemoptysis. Code Status: Full code. Family Communication: Patient's wife at the bedside. Disposition Plan: Home. Consults called: We will consult pulmonary critical care. Admission status: Inpatient.   Rise Patience MD [facility] Pager [phone].  If 7PM-7AM, please contact night-coverage [email or web address] Password [email or web address]  01/13/2021, 4:03 AM

## 2021-01-13 NOTE — ED Notes (Signed)
Floor nurse Santiago Bur RN was sent message for SBAR at this time

## 2021-01-13 NOTE — Sepsis Progress Note (Signed)
Following per sepsis protocol   

## 2021-01-13 NOTE — ED Notes (Signed)
Attempted to call report and was advised just arrived would talk to charge nurse

## 2021-01-13 NOTE — ED Notes (Signed)
Attempted to call report and advised pt may need to go to progressive due to his resp rate. Advised would call my charge nurse

## 2021-01-13 NOTE — Progress Notes (Signed)
ED nurse Vaughan Basta called to give report,asked Vaughan Basta RN why was patients respirations running 28, Vaughan Basta stated she did not know why patients respirations were high while she was taken care of him in the ED, I asked Vaughan Basta the ED Nurse did she tell the doctor about his high respirations Vaughan Basta stated No,but contacted the rapid response who stated he was okay to come up.I asked the nurse was there any parameters for the patient she stated no. I explained to [facility] the ED nurse  that if the patients comes up here and respirations are still high and looks like he is in distress and we have to transfer him to another floor I will have to write up Vaughan Basta the ED nurse who could not answer my questions about the patient.

## 2021-01-13 NOTE — ED Provider Notes (Signed)
[facility] EMERGENCY DEPARTMENT Provider Note  CSN: [id number] Arrival date & time: 01/12/21 1315  Chief Complaint(s) Chills  HPI Brett Bean is a 75 y.o. male with a past medical history listed below who was treated recently for community-acquired pneumonia presents to the emergency department for persistent fevers, chills and coughing.  This is been ongoing since he left the hospital.  He endorses hemoptysis intermittently.  Also endorses subjective fevers and night sweats.  These improved with Tylenol.  No shortness of breath.  No chest pain.  No abdominal pain.  No nausea or vomiting.  No other physical complaints.  HPI  Past Medical History Past Medical History:  Diagnosis Date   Hyperlipidemia    Patient Active Problem List   Diagnosis Date Noted   Empyema, right ([place]) 12/18/2020   Acute respiratory failure with hypoxia ([place]) 12/15/2020   CAP (community acquired pneumonia) 12/15/2020   Pleural effusion on right 12/15/2020   Home Medication(s) Prior to Admission medications   Medication Sig Start Date End Date Taking? Authorizing Provider  acetaminophen (TYLENOL) 500 MG tablet Take 1,000 mg by mouth every 6 (six) hours as needed for moderate pain or fever.   Yes [provider]  amoxicillin-clavulanate (AUGMENTIN) 875-125 MG tablet Take 1 tablet by mouth 2 (two) times daily for 28 days. 12/18/20 01/15/21 Yes Pahwani, Einar Grad, MD  cholecalciferol (VITAMIN D) 25 MCG (1000 UNIT) tablet Take 1,000 Units by mouth daily.   Yes [provider]  Lactobacillus (PROBIOTIC ACIDOPHILUS) CAPS Take 1 capsule by mouth daily.   Yes [provider]  Misc Natural Products (PROSTATE) CAPS Take 1 capsule by mouth in the morning and at bedtime. supplement   Yes [provider]  Multiple Vitamins-Minerals (EYE VITAMINS PO) Take 1 tablet by mouth daily. supplement   Yes [provider]                                                                                                                                     Past Surgical History Past Surgical History:  Procedure Laterality Date   NO PAST SURGERIES     Family History Family History  Problem Relation Age of Onset   Cancer Mother    Heart disease Father    Heart failure Father    Heart murmur Father    Hypertension Sister     Social History Social History   Tobacco Use   Smoking status: Former    Types: Cigarettes   Smokeless tobacco: Never  Substance Use Topics   Alcohol use: Never   Drug use: Never   Allergies Asa [aspirin]  Review of Systems Review of Systems All other systems are reviewed and are negative for acute change except as noted in the HPI  Physical Exam Vital Signs  I have reviewed the triage vital signs BP 110/75   Pulse 98   Temp 99.7 F (37.6 C) (Oral)  Resp (!) 28   Ht 5\' 8"  (1.727 m)   Wt 80 kg   SpO2 96%   BMI 26.82 kg/m   Physical Exam Vitals reviewed.  Constitutional:      General: He is not in acute distress.    Appearance: He is well-developed. He is not diaphoretic.  HENT:     Head: Normocephalic and atraumatic.     Nose: Nose normal.  Eyes:     General: No scleral icterus.       Right eye: No discharge.        Left eye: No discharge.     Conjunctiva/sclera: Conjunctivae normal.     Pupils: Pupils are equal, round, and reactive to light.  Cardiovascular:     Rate and Rhythm: Normal rate. Rhythm regularly irregular.     Heart sounds: No murmur heard.   No friction rub. No gallop.  Pulmonary:     Effort: Pulmonary effort is normal. No respiratory distress.     Breath sounds: Normal breath sounds. No stridor. No rales.  Abdominal:     General: There is no distension.     Palpations: Abdomen is soft.     Tenderness: There is no abdominal tenderness.  Musculoskeletal:        General: No tenderness.     Cervical back: Normal range of motion and neck supple.  Skin:    General: Skin is warm and dry.      Findings: No erythema or rash.  Neurological:     Mental Status: He is alert and oriented to person, place, and time.    ED Results and Treatments Labs (all labs ordered are listed, but only abnormal results are displayed) Labs Reviewed  BASIC METABOLIC PANEL - Abnormal; Notable for the following components:      Result Value   Sodium 133 (*)    Glucose, Bld 107 (*)    All other components within normal limits  CBC WITH DIFFERENTIAL/PLATELET - Abnormal; Notable for the following components:   WBC 25.0 (*)    RBC 3.63 (*)    Hemoglobin 10.5 (*)    HCT 33.8 (*)    Platelets 474 (*)    Neutro Abs 19.1 (*)    Monocytes Absolute 2.0 (*)    Abs Immature Granulocytes 0.22 (*)    All other components within normal limits  BRAIN NATRIURETIC PEPTIDE - Abnormal; Notable for the following components:   B Natriuretic Peptide 196.2 (*)    All other components within normal limits  RESP PANEL BY RT-PCR (FLU A&B, COVID) ARPGX2  CULTURE, BLOOD (ROUTINE X 2)  CULTURE, BLOOD (ROUTINE X 2)  LACTIC ACID, PLASMA                                                                                                                         EKG  EKG Interpretation  Date/Time:  Thursday January 12 2021 16:08:15 EST Ventricular Rate:  104 PR Interval:  140 QRS Duration: 82  QT Interval:  278 QTC Calculation: 365 R Axis:   -11 Text Interpretation: Sinus tachycardia Minimal voltage criteria for LVH, may be normal variant ( R in aVL ) Borderline ECG Since last tracing rate faster Otherwise no significant change Confirmed by Addison Lank [phone]) on 01/13/2021 1:30:53 AM       Radiology DG Chest 2 View  Result Date: 01/12/2021 CLINICAL DATA:  Shortness of breath EXAM: CHEST - 2 VIEW COMPARISON:  Radiograph 01/03/2021 FINDINGS: Unchanged cardiomediastinal silhouette. Persistent right basilar opacities and small right pleural effusion. Left lung is clear. No visible pneumothorax. No acute osseous  abnormalities. IMPRESSION: Persistent right basilar consolidation and small right pleural effusion. Electronically Signed   By: Maurine Simmering M.D.   On: 01/12/2021 16:33   CT Chest Wo Contrast  Result Date: 01/13/2021 CLINICAL DATA:  Pneumonia, unresolved EXAM: CT CHEST WITHOUT CONTRAST TECHNIQUE: Multidetector CT imaging of the chest was performed following the standard protocol without IV contrast. COMPARISON:  Chest radiographs dated 01/12/2021. CTA chest dated 12/14/2020. FINDINGS: Cardiovascular: The heart is normal in size. No pericardial effusion. No evidence of thoracic aortic aneurysm. Mediastinum/Nodes: Small mediastinal lymph nodes, including a dominant 18 mm short axis subcarinal node (series 3/image 86). Visualized thyroid is unremarkable. Lungs/Pleura: 10.2 x 6.6 x 6.5 cm bilobed mass along the inferior right middle and lower lobes (series 3/image 96), previously 10.6 x 8.2 x 6.2 cm. Given the slight decrease in measuring size, this could still reflect severe infection/pneumonia, but primary bronchogenic neoplasm is certainly not excluded. New patchy/nodular opacity in the medial right upper lobe (series 4/image 59). New 10 mm nodule in the anterior left upper lobe (series 4/image 77). New 2.7 cm nodule in the medial left upper lobe/perihilar region (series 4/image 90). Differential consideration includes multifocal infection versus metastatic disease. Associated small right pleural effusion, unchanged. No pneumothorax. Upper Abdomen: New 15 mm lesion along the anterior left upper kidney (series 3/image 172), not clearly evident on recent CT. 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 Suspected vertebral hemangioma along the anterior aspect of T11. IMPRESSION: 10.2 cm bilobed masslike opacity along the inferior right middle and lower lobes, possibly mildly improved from the prior. While this may reflect severe infection/pneumonia, given persistence, primary bronchogenic neoplasm is not excluded.  Bronchoscopy is suggested. New multifocal patchy/nodular opacities in the lungs bilaterally, possibly reflecting multifocal infection versus metastatic disease. Associated mediastinal lymphadenopathy, including an 18 mm short axis subcarinal node. Small right pleural effusion, unchanged. New 15 mm lesion in the anterior left upper kidney, poorly evaluated on unenhanced CT. If this patient has a documented pulmonary malignancy, this can be evaluated at the time of staging of the abdomen/pelvis. Electronically Signed   By: Julian Hy M.D.   On: 01/13/2021 02:46    Pertinent labs & imaging results that were available during my care of the patient were reviewed by me and considered in my medical decision making (see MDM for details).  Medications Ordered in ED Medications  0.9 %  sodium chloride infusion (has no administration in time range)  ceFEPIme (MAXIPIME) 2 g in sodium chloride 0.9 % 100 mL IVPB (has no administration in time range)  acetaminophen (TYLENOL) tablet 650 mg (has no administration in time range)  vancomycin (VANCOREADY) IVPB 1500 mg/300 mL (has no administration in time range)  Procedures Procedures  (including critical care time)  Medical Decision Making / ED Course I have reviewed the nursing notes for this encounter and the patient's prior records (if available in EHR or on provided paperwork).  CAVON NICOLLS was evaluated in Emergency Department on 01/13/2021 for the symptoms described in the history of present illness. He was evaluated in the context of the global COVID-19 pandemic, which necessitated consideration that the patient might be at risk for infection with the SARS-CoV-2 virus that causes COVID-19. Institutional protocols and algorithms that pertain to the evaluation of patients at risk for COVID-19 are in a state of rapid  change based on information released by regulatory bodies including the CDC and federal and state organizations. These policies and algorithms were followed during the patient's care in the ED.     Work-up is notable for recurrent multifocal pneumonia versus metastatic disease. Patient is febrile with leukocytosis.  Code sepsis initiated and he was started on empiric antibiotics.  IV fluids given.  Patient has not severely septic requiring 30 cc/kg of IV fluids. Admitted to medicine for further work-up and management.  Pertinent labs & imaging results that were available during my care of the patient were reviewed by me and considered in my medical decision making:    Final Clinical Impression(s) / ED Diagnoses Final diagnoses:  Multifocal pneumonia     This chart was dictated using voice recognition software.  Despite best efforts to proofread,  errors can occur which can change the documentation meaning.    Fatima Blank, MD 01/13/21 [phone]

## 2021-01-13 NOTE — ED Notes (Signed)
Per admit provider leave fluids going as is

## 2021-01-13 NOTE — Progress Notes (Signed)
Pt arrived to 4e from ED. Pt oriented to room and staff. Vitals obtained. Telemetry box applied and CCMD notified x2 verifiers. CHG bath done. Pt and family educated on plan of care.

## 2021-01-13 NOTE — ED Notes (Signed)
Seen PCP Tuesday for follow up from pneumonia. Here to be evaluated due to chills, cough, concern for WBC elevated.

## 2021-01-13 NOTE — ED Notes (Signed)
Admitting paged to RN  

## 2021-01-13 NOTE — Progress Notes (Signed)
+  Pharmacy Antibiotic Note  Brett Bean is a 75 y.o. male admitted on 01/12/2021 with pneumonia.  Pharmacy has been consulted for vancomycin and cefepime dosing. Vancomycin 1500mg  and cefepime 2g given in ED.   Plan: Start cefepime 2g q8h  Start vancomycin 1500 q24h (eAUC 455 using scr 0.94) Monitor renal function, cultures, and clinical progression  Height: 5\' 8"  (172.7 cm) Weight: 80 kg (176 lb 5.9 oz) IBW/kg (Calculated) : 68.4  Temp (24hrs), Avg:99.4 F (37.4 C), Min:98.6 F (37 C), Max:100.2 F (37.9 C)  Recent Labs  Lab 01/12/21 1603  WBC 25.0*  CREATININE 0.94    Estimated Creatinine Clearance: 63.7 mL/min (by C-G formula based on SCr of 0.94 mg/dL).    Allergies  Allergen Reactions   Asa [Aspirin] Nausea And Vomiting    Antimicrobials this admission: Vancomycin 11/11 >>  Cefepime 11/11 >>  Dose adjustments this admission:  Microbiology results: 11/11 bcx:  11/11 resp:  Thank you for allowing pharmacy to be a part of this patient's care.  Cristela Felt, PharmD, BCPS Clinical Pharmacist 01/13/2021 4:23 AM

## 2021-01-14 DIAGNOSIS — D649 Anemia, unspecified: Secondary | ICD-10-CM | POA: Diagnosis not present

## 2021-01-14 DIAGNOSIS — R918 Other nonspecific abnormal finding of lung field: Secondary | ICD-10-CM | POA: Diagnosis not present

## 2021-01-14 DIAGNOSIS — E440 Moderate protein-calorie malnutrition: Secondary | ICD-10-CM | POA: Diagnosis not present

## 2021-01-14 DIAGNOSIS — J189 Pneumonia, unspecified organism: Secondary | ICD-10-CM | POA: Diagnosis not present

## 2021-01-14 LAB — BASIC METABOLIC PANEL
Anion gap: 9 (ref 5–15)
BUN: 8 mg/dL (ref 8–23)
CO2: 23 mmol/L (ref 22–32)
Calcium: 8.6 mg/dL — ABNORMAL LOW (ref 8.9–10.3)
Chloride: 98 mmol/L (ref 98–111)
Creatinine, Ser: 0.81 mg/dL (ref 0.61–1.24)
GFR, Estimated: >60 mL/min (ref >60)
Glucose, Bld: 114 mg/dL — ABNORMAL HIGH (ref 70–99)
Potassium: 3.7 mmol/L (ref 3.5–5.1)
Sodium: 130 mmol/L — ABNORMAL LOW (ref 135–145)

## 2021-01-14 LAB — PHOSPHORUS: Phosphorus: 2.8 mg/dL (ref 2.5–4.6)

## 2021-01-14 LAB — CBC
HCT: 29.6 % — ABNORMAL LOW (ref 39.0–52.0)
Hemoglobin: 9.1 g/dL — ABNORMAL LOW (ref 13.0–17.0)
MCH: 28.1 pg (ref 26.0–34.0)
MCHC: 30.7 g/dL (ref 30.0–36.0)
MCV: 91.4 fL (ref 80.0–100.0)
Platelets: 467 10*3/uL — ABNORMAL HIGH (ref 150–400)
RBC: 3.24 MIL/uL — ABNORMAL LOW (ref 4.22–5.81)
RDW: 14.1 % (ref 11.5–15.5)
WBC: 26.1 10*3/uL — ABNORMAL HIGH (ref 4.0–10.5)
nRBC: 0 % (ref 0.0–0.2)

## 2021-01-14 LAB — MAGNESIUM: Magnesium: 1.9 mg/dL (ref 1.7–2.4)

## 2021-01-14 NOTE — Progress Notes (Signed)
PROGRESS NOTE  Brett Bean BJS:[id number] DOB: 01/27/1946 DOA: 01/12/2021 PCP: Iona Beard, MD  Brief History    This 75 years old male with PMH significant for hyperlipidemia, elevated PSA being followed by primary care physician admitted last month for pneumonia and discharged on Augmentin.  Patient has been having subjective feeling of fever associated with chills and productive cough at some time hemoptysis and weight loss of 20 pounds over last 1 month.  Patient is admitted for multifocal pneumonia,  started on antibiotics.  CT chest showed lung mass.  Pulmonology was consulted recommended to continue antibiotics and repeat imaging in 3 to 4 days to see if there is any improvement.  if there is no improvement Patient might require bronchoscopy and lung biopsy.  Patient was seen and examined at bedside.  The patient states that he is feeling better.  Consultants  Pulmonology  Procedures  None  Antibiotics   Anti-infectives (From admission, onward)    Start     Dose/Rate Route Frequency Ordered Stop   01/14/21 0400  vancomycin (VANCOREADY) IVPB 1500 mg/300 mL  Status:  Discontinued        1,500 mg 150 mL/hr over 120 Minutes Intravenous Every 24 hours 01/13/21 0429 01/14/21 1243   01/13/21 1130  ceFEPIme (MAXIPIME) 2 g in sodium chloride 0.9 % 100 mL IVPB        2 g 200 mL/hr over 30 Minutes Intravenous Every 8 hours 01/13/21 0429     01/13/21 0415  azithromycin (ZITHROMAX) 500 mg in sodium chloride 0.9 % 250 mL IVPB        500 mg 250 mL/hr over 60 Minutes Intravenous Every 24 hours 01/13/21 0402 01/18/21 0414   01/13/21 0315  vancomycin (VANCOREADY) IVPB 1000 mg/200 mL  Status:  Discontinued        1,000 mg 200 mL/hr over 60 Minutes Intravenous  Once 01/13/21 0305 01/13/21 0307   01/13/21 0315  ceFEPIme (MAXIPIME) 2 g in sodium chloride 0.9 % 100 mL IVPB        2 g 200 mL/hr over 30 Minutes Intravenous  Once 01/13/21 0305 01/13/21 0407   01/13/21 0315  vancomycin  (VANCOREADY) IVPB 1500 mg/300 mL        1,500 mg 150 mL/hr over 120 Minutes Intravenous  Once 01/13/21 0308 01/13/21 0635        Subjective  The patient is resting comfortably. No new complaints.   Objective   Vitals:  Vitals:   01/14/21 0748 01/14/21 1138  BP: 115/75 105/72  Pulse: 100 92  Resp: 20 20  Temp: 98.7 F (37.1 C) 98.2 F (36.8 C)  SpO2: 94% 96%    Exam:  Constitutional:  The patient is awake, alert, and oriented x 3. No acute distress. Respiratory:  No increased work of breathing. Positive for scattered rales No wheezes or rales No tactile fremitus Cardiovascular:  Regular rate and rhythm No murmurs, ectopy, or gallups. No lateral PMI. No thrills. Abdomen:  Abdomen is soft, non-tender, non-distended No hernias, masses, or organomegaly Normoactive bowel sounds.  Musculoskeletal:  No cyanosis, clubbing, or edema Skin:  No rashes, lesions, ulcers palpation of skin: no induration or nodules Neurologic:  CN 2-12 intact Sensation all 4 extremities intact Psychiatric:  Mental status Mood, affect appropriate Orientation to person, place, time  judgment and insight appear intact I have personally reviewed the following:   Today's Data  Vitals  Lab Data  BMP, CBC  Micro Data  Sputum culture is pending Blood cultures  x 2 have had no growth  Imaging  CT chest  Cardiology Data  EKG  Scheduled Meds:  multivitamin with minerals  1 tablet Oral Daily   Continuous Infusions:  azithromycin 500 mg (01/14/21 0310)   ceFEPime (MAXIPIME) IV 2 g (01/14/21 0702)   Patient Active Problem List   Diagnosis Date Noted   Malnutrition of moderate degree 01/14/2021   Lung mass 01/14/2021   Normochromic normocytic anemia 01/13/2021   Empyema, right ([place]) 12/18/2020   Acute respiratory failure with hypoxia ([place]) 12/15/2020   CAP (community acquired pneumonia) 12/15/2020   Pleural effusion on right 12/15/2020      LOS: 1 day   A & P  Pneumonia  -patient has been empirically placed on antibiotics for pneumonia at this time vancomycin cefepime and Zithromax.  We will get sputum cultures.  There is also concern for possible bronchogenic mass with possible metastatic lesions for which we will consult pulmonary critical care for possible bronchoscope. Lung Mass: Right middle and lower lobes demonstrate a bilobed mass. Although this could represent infection, concern is for a bronchogenic neoplasm. Pulmonology has been consulted for possibly bronchoscopy.  Normocytic normochromic anemia has been persistent since last admission.  Follow CBC check anemia panel with next blood draw. Hemoglobin is down to 9.1 from 10.5 on admission. Monitor. History of elevated PSA being followed by primary care physician. Moderate malnutrition: Patient has cachexia with low albumin. Nutrition has been consulted.  I have seen and examined this patient myself. I have spent 34 minutes in his evaluation and care.    DVT prophylaxis: SCDs.  Avoiding anticoagulation anticipation of possible procedures for the lung lesions and also patient is having hemoptysis. Code Status: Full code. Family Communication: Patient's wife at the bedside. Disposition Plan: Home.   Yannis Gumbs, DO [facility] Direct contact: see [email or web address]  7PM-7AM contact night coverage as above 01/14/2021, 3:01 PM  LOS: 1 day

## 2021-01-14 NOTE — Plan of Care (Signed)
  Problem: Education: Goal: Knowledge of General Education information will improve Description: Including pain rating scale, medication(s)/side effects and non-pharmacologic comfort measures Outcome: Progressing   Problem: Clinical Measurements: Goal: Ability to maintain clinical measurements within normal limits will improve Outcome: Progressing Goal: Respiratory complications will improve Outcome: Progressing   Problem: Activity: Goal: Risk for activity intolerance will decrease Outcome: Progressing   Problem: Coping: Goal: Level of anxiety will decrease Outcome: Progressing   Problem: Elimination: Goal: Will not experience complications related to bowel motility Outcome: Progressing Goal: Will not experience complications related to urinary retention Outcome: Progressing   Problem: Pain Managment: Goal: General experience of comfort will improve Outcome: Progressing   Problem: Safety: Goal: Ability to remain free from injury will improve Outcome: Progressing   Problem: Skin Integrity: Goal: Risk for impaired skin integrity will decrease Outcome: Progressing

## 2021-01-14 NOTE — Progress Notes (Signed)
NAME:  Brett Bean, MRN:  [id number], DOB:  May 03, 1945, LOS: 1 ADMISSION DATE:  01/12/2021, CONSULTATION DATE:  01/13/2021 REFERRING MD:  Dr. Hal Hope, [facility], CHIEF COMPLAINT:  Chest pain   History of Present Illness:  75 yo male with remote history of smoking was in hospital from 12/14/20 to 12/18/20.  He presented with chest pain and fever 101F associated with productive cough with episodes of hemoptysis.  Found to have Rt sided infiltrate and pleural effusion with loculated components.  He was started on antibiotics and had assessment by thoracic surgery.  Repeat CT chest showed free flowing fluid and chest tube placement was deferred.  He was discharged home on augmentin, and took his last dose on 01/12/21.  He initially felt improvement in his symptoms after hospital discharge, but then started developing intermittent fever again up to 102F.  This has been associated with sweats.  He has lost about 25 lbs over the past 1 month.  He is coughing more sputum, and has yellow-green appearance with intermittent episodes of hemoptysis.  He has recurrence of right sided pleuritic type chest pain.  This prompted his return to the ER.  He quit smoking 40 yrs ago.  No prior history of pneumonia or exposure to tuberculosis.  He is from [place].  Was never in the [employer]  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  He denies symptoms of aspiration or reflux, or dental issues (he has upper dentures).  Pertinent  Medical History  HLD  Studies:  CT angio chest 12/15/20 >> moderate ATX/infiltrate in RUL/RML/RLL, mod to large Rt pleural effusion with loculated components, elevated of Rt hemidiaphragm, small hiatal hernia CT chest 12/16/20 >> small Rt pleural effusion is free flowing CT chest 01/13/21 >> 10.2 x 6.6 x 6.5 cm bilobed mass along inferior RML and RLL (was 10.6 x 8.2 x 6.2 cm), new patchy nodular opacity medial RUL, new 10 mm anterior LUL nodule, new 2.7 cm nodule in medial LUL, small Rt  effusion, new 15 mm lesion along anterior Lt upper kidney  Interim History / Subjective:   Feeling better.  States that cough and dyspnea has improved Sitting up in chair at bedside with no new complaint  Objective   Blood pressure 115/75, pulse 100, temperature 98.7 F (37.1 C), temperature source Oral, resp. rate 20, height 5\' 8"  (1.727 m), weight 76.1 kg, SpO2 94 %.        Intake/Output Summary (Last 24 hours) at 01/14/2021 1015 Last data filed at 01/14/2021 0749 Gross per 24 hour  Intake 2537.18 ml  Output 1615 ml  Net 922.18 ml   Filed Weights   01/12/21 1559 01/13/21 1649  Weight: 80 kg 76.1 kg    Examination: Blood pressure 105/72, pulse 92, temperature 98.2 F (36.8 C), temperature source Oral, resp. rate 20, height 5\' 8"  (1.727 m), weight 76.1 kg, SpO2 96 %. Gen:      No acute distress HEENT:  EOMI, sclera anicteric Neck:     No masses; no thyromegaly Lungs:    Scattered crackles at right lung base CV:         Regular rate and rhythm; no murmurs Abd:      + bowel sounds; soft, non-tender; no palpable masses, no distension Ext:    No edema; adequate peripheral perfusion Skin:      Warm and dry; no rash Neuro: alert and oriented x 3 Psych: normal mood and affect   Discussion:  He has persistent cough with sputum,  intermittent hemoptysis, fever up to 102F, intermittent sweats, right pleuritic chest pain, and weight loss.  This is associated with leukocytosis.  Main concern is still for atypical infectious process, but malignancy still in the differential.  Assessment & Plan:   Non resolving pulmonary infiltrates with concern for atypical pneumonia versus malignancy. Has persistent right base infiltrate for a month with ongoing intermittent hemoptysis, fevers Treating for pneumonia but malignancy still in the differential Continue cefepime, zithromax Follow cultures HIV test is negative.  Quantiferon gold pending Follow for clinical, radiologic improvement.   Consider bronchoscopy during this admission if no improvement  15 mm lesion anterior Lt upper kidney. Will need follow-up at repeat imaging.  Per primary team  Updated patient and family at bedside  Will follow back on Monday 11/14.  Please call with any questions  Signature:   Marshell Garfinkel MD [facility] See Amion for pager  If no response to pager , please call [phone] until 7pm After 7:00 pm call Elink  [phone] 01/14/2021, 10:16 AM

## 2021-01-15 DIAGNOSIS — D649 Anemia, unspecified: Secondary | ICD-10-CM | POA: Diagnosis not present

## 2021-01-15 DIAGNOSIS — R918 Other nonspecific abnormal finding of lung field: Secondary | ICD-10-CM | POA: Diagnosis not present

## 2021-01-15 DIAGNOSIS — E440 Moderate protein-calorie malnutrition: Secondary | ICD-10-CM | POA: Diagnosis not present

## 2021-01-15 DIAGNOSIS — N2889 Other specified disorders of kidney and ureter: Secondary | ICD-10-CM

## 2021-01-15 DIAGNOSIS — J189 Pneumonia, unspecified organism: Secondary | ICD-10-CM | POA: Diagnosis not present

## 2021-01-15 LAB — CBC WITH DIFFERENTIAL/PLATELET
Abs Immature Granulocytes: 0.33 10*3/uL — ABNORMAL HIGH (ref 0.00–0.07)
Basophils Absolute: 0.1 10*3/uL (ref 0.0–0.1)
Basophils Relative: 0 %
Eosinophils Absolute: 0.1 10*3/uL (ref 0.0–0.5)
Eosinophils Relative: 0 %
HCT: 25.1 % — ABNORMAL LOW (ref 39.0–52.0)
Hemoglobin: 8.1 g/dL — ABNORMAL LOW (ref 13.0–17.0)
Immature Granulocytes: 1 %
Lymphocytes Relative: 12 %
Lymphs Abs: 3.3 10*3/uL (ref 0.7–4.0)
MCH: 28.8 pg (ref 26.0–34.0)
MCHC: 32.3 g/dL (ref 30.0–36.0)
MCV: 89.3 fL (ref 80.0–100.0)
Monocytes Absolute: 1.9 10*3/uL — ABNORMAL HIGH (ref 0.1–1.0)
Monocytes Relative: 7 %
Neutro Abs: 20.7 10*3/uL — ABNORMAL HIGH (ref 1.7–7.7)
Neutrophils Relative %: 80 %
Platelets: 434 10*3/uL — ABNORMAL HIGH (ref 150–400)
RBC: 2.81 MIL/uL — ABNORMAL LOW (ref 4.22–5.81)
RDW: 14.1 % (ref 11.5–15.5)
WBC: 26.4 10*3/uL — ABNORMAL HIGH (ref 4.0–10.5)
nRBC: 0 % (ref 0.0–0.2)

## 2021-01-15 LAB — CULTURE, RESPIRATORY W GRAM STAIN: Culture: NORMAL

## 2021-01-15 LAB — BASIC METABOLIC PANEL
Anion gap: 8 (ref 5–15)
BUN: 8 mg/dL (ref 8–23)
CO2: 22 mmol/L (ref 22–32)
Calcium: 8.1 mg/dL — ABNORMAL LOW (ref 8.9–10.3)
Chloride: 98 mmol/L (ref 98–111)
Creatinine, Ser: 0.85 mg/dL (ref 0.61–1.24)
GFR, Estimated: >60 mL/min (ref >60)
Glucose, Bld: 104 mg/dL — ABNORMAL HIGH (ref 70–99)
Potassium: 4 mmol/L (ref 3.5–5.1)
Sodium: 128 mmol/L — ABNORMAL LOW (ref 135–145)

## 2021-01-15 NOTE — Progress Notes (Signed)
PROGRESS NOTE  Brett Bean OIN:[id number] DOB: 1945/12/09 DOA: 01/12/2021 PCP: Iona Beard, MD  Brief History    This 75 years old male with PMH significant for hyperlipidemia, elevated PSA being followed by primary care physician admitted last month for pneumonia and discharged on Augmentin.  Patient has been having subjective feeling of fever associated with chills and productive cough at some time hemoptysis and weight loss of 20 pounds over last 1 month.  Patient is admitted for multifocal pneumonia,  started on antibiotics.  CT chest showed lung mass.  Pulmonology was consulted recommended to continue antibiotics and repeat imaging in 3 to 4 days to see if there is any improvement.  if there is no improvement Patient might require bronchoscopy and lung biopsy.  Patient was seen and examined at bedside.  The patient states that he is feeling better.  Consultants  Pulmonology  Procedures  None  Antibiotics   Anti-infectives (From admission, onward)    Start     Dose/Rate Route Frequency Ordered Stop   01/14/21 0400  vancomycin (VANCOREADY) IVPB 1500 mg/300 mL  Status:  Discontinued        1,500 mg 150 mL/hr over 120 Minutes Intravenous Every 24 hours 01/13/21 0429 01/14/21 1243   01/13/21 1130  ceFEPIme (MAXIPIME) 2 g in sodium chloride 0.9 % 100 mL IVPB        2 g 200 mL/hr over 30 Minutes Intravenous Every 8 hours 01/13/21 0429     01/13/21 0415  azithromycin (ZITHROMAX) 500 mg in sodium chloride 0.9 % 250 mL IVPB        500 mg 250 mL/hr over 60 Minutes Intravenous Every 24 hours 01/13/21 0402 01/18/21 0414   01/13/21 0315  vancomycin (VANCOREADY) IVPB 1000 mg/200 mL  Status:  Discontinued        1,000 mg 200 mL/hr over 60 Minutes Intravenous  Once 01/13/21 0305 01/13/21 0307   01/13/21 0315  ceFEPIme (MAXIPIME) 2 g in sodium chloride 0.9 % 100 mL IVPB        2 g 200 mL/hr over 30 Minutes Intravenous  Once 01/13/21 0305 01/13/21 0407   01/13/21 0315  vancomycin  (VANCOREADY) IVPB 1500 mg/300 mL        1,500 mg 150 mL/hr over 120 Minutes Intravenous  Once 01/13/21 0308 01/13/21 0635        Subjective  The patient is resting comfortably. No new complaints.   Objective   Vitals:  Vitals:   01/15/21 0814 01/15/21 0815  BP: 125/83 128/83  Pulse:  99  Resp:  (!) 23  Temp: 98.8 F (37.1 C) 98.8 F (37.1 C)  SpO2: 95% 95%    Exam:  Constitutional:  The patient is awake, alert, and oriented x 3. No acute distress. Respiratory:  No increased work of breathing. Positive for scattered rales No wheezes or rales No tactile fremitus Cardiovascular:  Regular rate and rhythm No murmurs, ectopy, or gallups. No lateral PMI. No thrills. Abdomen:  Abdomen is soft, non-tender, non-distended No hernias, masses, or organomegaly Normoactive bowel sounds.  Musculoskeletal:  No cyanosis, clubbing, or edema Skin:  No rashes, lesions, ulcers palpation of skin: no induration or nodules Neurologic:  CN 2-12 intact Sensation all 4 extremities intact Psychiatric:  Mental status Mood, affect appropriate Orientation to person, place, time  judgment and insight appear intact I have personally reviewed the following:   Today's Data  Vitals  Lab Data  BMP, CBC  Micro Data  Sputum culture is pending Blood  cultures x 2 have had no growth  Imaging  CT chest  Cardiology Data  EKG  Scheduled Meds:  multivitamin with minerals  1 tablet Oral Daily   Continuous Infusions:  azithromycin 500 mg (01/15/21 0406)   ceFEPime (MAXIPIME) IV 2 g (01/15/21 2585)   Patient Active Problem List   Diagnosis Date Noted   Malnutrition of moderate degree 01/14/2021   Lung mass 01/14/2021   Normochromic normocytic anemia 01/13/2021   Empyema, right ([place]) 12/18/2020   Acute respiratory failure with hypoxia ([place]) 12/15/2020   CAP (community acquired pneumonia) 12/15/2020   Pleural effusion on right 12/15/2020      LOS: 2 days   A & P  Pneumonia  -patient has been empirically placed on antibiotics for pneumonia at this time vancomycin cefepime and Zithromax.  We will get sputum cultures.  There is also concern for possible bronchogenic mass with possible metastatic lesions for which we will consult pulmonary critical care for possible bronchoscope. Lung Mass: Right middle and lower lobes demonstrate a bilobed mass. Although this could represent infection, concern is for a bronchogenic neoplasm. Pulmonology has been consulted for possibly bronchoscopy.  Renal mass: 15 mm lesion seen on anterior left upper kidney not evident on previous CT. MRI abdomen with contrast ordered. Continued leukocytosis 26.4.  Normocytic normochromic anemia has been persistent since last admission.  Follow CBC check anemia panel with next blood draw. Hemoglobin is down to 8.1 from 10.5 on admission. Monitor. Transfuse for hemoglobin less than 7.0. History of elevated PSA being followed by primary care physician. Moderate malnutrition: Patient has cachexia with low albumin. Nutrition has been consulted.  I have seen and examined this patient myself. I have spent 32 minutes in his evaluation and care.    DVT prophylaxis: SCDs.  Avoiding anticoagulation anticipation of possible procedures for the lung lesions and also patient is having hemoptysis. Code Status: Full code. Family Communication: Patient's wife at the bedside. Disposition Plan: Home.   Shanika Levings, DO [facility] Direct contact: see [email or web address]  7PM-7AM contact night coverage as above 01/15/2021, 1:39 PM  LOS: 1 day

## 2021-01-16 ENCOUNTER — Inpatient Hospital Stay (HOSPITAL_COMMUNITY): Payer: Medicare Other

## 2021-01-16 ENCOUNTER — Encounter (HOSPITAL_COMMUNITY): Payer: Self-pay | Admitting: Internal Medicine

## 2021-01-16 DIAGNOSIS — J189 Pneumonia, unspecified organism: Secondary | ICD-10-CM | POA: Diagnosis not present

## 2021-01-16 DIAGNOSIS — E440 Moderate protein-calorie malnutrition: Secondary | ICD-10-CM | POA: Diagnosis not present

## 2021-01-16 DIAGNOSIS — R918 Other nonspecific abnormal finding of lung field: Secondary | ICD-10-CM | POA: Diagnosis not present

## 2021-01-16 DIAGNOSIS — D649 Anemia, unspecified: Secondary | ICD-10-CM | POA: Diagnosis not present

## 2021-01-16 LAB — CBC WITH DIFFERENTIAL/PLATELET
Abs Immature Granulocytes: 0.27 10*3/uL — ABNORMAL HIGH (ref 0.00–0.07)
Basophils Absolute: 0.1 10*3/uL (ref 0.0–0.1)
Basophils Relative: 0 %
Eosinophils Absolute: 0.1 10*3/uL (ref 0.0–0.5)
Eosinophils Relative: 1 %
HCT: 25.8 % — ABNORMAL LOW (ref 39.0–52.0)
Hemoglobin: 8.1 g/dL — ABNORMAL LOW (ref 13.0–17.0)
Immature Granulocytes: 1 %
Lymphocytes Relative: 12 %
Lymphs Abs: 3.1 10*3/uL (ref 0.7–4.0)
MCH: 28.1 pg (ref 26.0–34.0)
MCHC: 31.4 g/dL (ref 30.0–36.0)
MCV: 89.6 fL (ref 80.0–100.0)
Monocytes Absolute: 1.7 10*3/uL — ABNORMAL HIGH (ref 0.1–1.0)
Monocytes Relative: 7 %
Neutro Abs: 20.6 10*3/uL — ABNORMAL HIGH (ref 1.7–7.7)
Neutrophils Relative %: 79 %
Platelets: 459 10*3/uL — ABNORMAL HIGH (ref 150–400)
RBC: 2.88 MIL/uL — ABNORMAL LOW (ref 4.22–5.81)
RDW: 14.4 % (ref 11.5–15.5)
WBC: 25.9 10*3/uL — ABNORMAL HIGH (ref 4.0–10.5)
nRBC: 0 % (ref 0.0–0.2)

## 2021-01-16 LAB — BASIC METABOLIC PANEL
Anion gap: 8 (ref 5–15)
BUN: 9 mg/dL (ref 8–23)
CO2: 22 mmol/L (ref 22–32)
Calcium: 8.4 mg/dL — ABNORMAL LOW (ref 8.9–10.3)
Chloride: 101 mmol/L (ref 98–111)
Creatinine, Ser: 0.82 mg/dL (ref 0.61–1.24)
GFR, Estimated: >60 mL/min (ref >60)
Glucose, Bld: 106 mg/dL — ABNORMAL HIGH (ref 70–99)
Potassium: 3.8 mmol/L (ref 3.5–5.1)
Sodium: 131 mmol/L — ABNORMAL LOW (ref 135–145)

## 2021-01-16 MED ORDER — GADOBUTROL 1 MMOL/ML IV SOLN
7.5000 mL | Freq: Once | INTRAVENOUS | Status: AC | PRN
  Administered 2021-01-16: 7.5 mL via INTRAVENOUS

## 2021-01-16 NOTE — Progress Notes (Signed)
NAME:  Brett Bean, MRN:  [id number], DOB:  [birth year], LOS: 3 ADMISSION DATE:  01/12/2021, CONSULTATION DATE:  01/13/2021 REFERRING MD:  Dr. Hal Hope, [facility], CHIEF COMPLAINT:  Chest pain   History of Present Illness:  75 yo male with remote history of smoking was in hospital from 12/14/20 to 12/18/20.  He presented with chest pain and fever 101F associated with productive cough with episodes of hemoptysis.  Found to have Rt sided infiltrate and pleural effusion with loculated components.  He was started on antibiotics and had assessment by thoracic surgery.  Repeat CT chest showed free flowing fluid and chest tube placement was deferred.  He was discharged home on augmentin, and took his last dose on 01/12/21.  He initially felt improvement in his symptoms after hospital discharge, but then started developing intermittent fever again up to 102F.  This has been associated with sweats.  He has lost about 25 lbs over the past 1 month.  He is coughing more sputum, and has yellow-green appearance with intermittent episodes of hemoptysis.  He has recurrence of right sided pleuritic type chest pain.  This prompted his return to the ER.  He quit smoking 40 yrs ago.  No prior history of pneumonia or exposure to tuberculosis.  He is from [place].  Was never in the [employer]  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  He denies symptoms of aspiration or reflux, or dental issues (he has upper dentures).  Pertinent  Medical History  HLD  Studies:  CT angio chest 12/15/20 >> moderate ATX/infiltrate in RUL/RML/RLL, mod to large Rt pleural effusion with loculated components, elevated of Rt hemidiaphragm, small hiatal hernia CT chest 12/16/20 >> small Rt pleural effusion is free flowing CT chest 01/13/21 >> 10.2 x 6.6 x 6.5 cm bilobed mass along inferior RML and RLL (was 10.6 x 8.2 x 6.2 cm), new patchy nodular opacity medial RUL, new 10 mm anterior LUL nodule, new 2.7 cm nodule in medial LUL, small Rt  effusion, new 15 mm lesion along anterior Lt upper kidney  Interim History / Subjective:   Patient continues to feel better. Having minimal cough during the day.  Reports having chills and sweats but no fevers.  Wife and daughter are at bedside.  Objective   Blood pressure 136/84, pulse 100, temperature 98.7 F (37.1 C), temperature source Oral, resp. rate 20, height 5\' 8"  (1.727 m), weight 76.1 kg, SpO2 90 %.        Intake/Output Summary (Last 24 hours) at 01/16/2021 1102 Last data filed at 01/16/2021 4967 Gross per 24 hour  Intake 720 ml  Output 600 ml  Net 120 ml   Filed Weights   01/12/21 1559 01/13/21 1649  Weight: 80 kg 76.1 kg    Examination: Gen:      Elderly male, no acute distress HEENT:  [place]/AT, sclera anicteric, moist mucous membranes Lungs:    diminished breath sounds right base, no wheezing or rhonchi CV:         Regular rate and rhythm; no murmurs Abd:      + bowel sounds; soft, non-tender; no palpable masses, no distension Ext:    No edema; adequate peripheral perfusion Skin:      Warm and dry; no rash Neuro: alert and oriented x 3 Psych: normal mood and affect   Discussion:  He has persistent cough with sputum, intermittent hemoptysis, fever up to 102F, intermittent sweats, right pleuritic chest pain, and weight loss.  This is associated with leukocytosis.  Main concern is still for atypical infectious process vs malignancy   Assessment & Plan:   Non resolving pulmonary infiltrates with concern for atypical pneumonia versus malignancy. Has persistent right base infiltrate for a month with ongoing intermittent hemoptysis, fevers Treating for pneumonia but malignancy still in the differential Continue cefepime, zithromax Follow cultures HIV test is negative.  Quantiferon gold pending Plan for bronchoscopy tomorrow or IR consult for CT guided biopsy of RLL mass/consolidation, will discuss with family today.  15 mm lesion anterior Lt upper kidney. MRI  abdomen 11/14, more concerning for cyst  Updated patient and family at bedside  Will continue to follow.  Please call with any questions  Signature:   Freda Jackson, MD [facility] Office: [phone]   See Amion for personal pager PCCM on call pager [phone] until 7pm. Please call Elink 7p-7a. [phone]

## 2021-01-16 NOTE — Progress Notes (Signed)
+  Pharmacy Antibiotic Note  Brett Bean is a 75 y.o. male admitted on 01/12/2021 with pneumonia.  Pharmacy has been consulted for vancomycin and cefepime dosing. Vancomycin 1500mg  and cefepime 2g given in ED.   Wbc continues to be elevated with some low grade fevers. Cultures remained neg. Plan to repeat imaging in a few days to determine need for bronch and biopsy. Cultures remained neg.   Plan: Cont cefepime 2g q8h  Monitor renal function, cultures, and clinical progression  Height: 5\' 8"  (172.7 cm) Weight: 76.1 kg (167 lb 12.3 oz) IBW/kg (Calculated) : 68.4  Temp (24hrs), Avg:98.9 F (37.2 C), Min:98.3 F (36.8 C), Max:100.2 F (37.9 C)  Recent Labs  Lab 01/12/21 1603 01/13/21 0334 01/14/21 0252 01/15/21 0311 01/16/21 0157  WBC 25.0*  --  26.1* 26.4* 25.9*  CREATININE 0.94  --  0.81 0.85 0.82  LATICACIDVEN  --  1.0  --   --   --      Estimated Creatinine Clearance: 73 mL/min (by C-G formula based on SCr of 0.82 mg/dL).    Allergies  Allergen Reactions   Asa [Aspirin] Nausea And Vomiting    Antimicrobials this admission: Vancomycin 11/11 >>11/12 Cefepime 11/11 >> Azithromycin 11/11>>  Dose adjustments this admission:  Microbiology results: 11/11 bcx: ngtd 11/11 resp: NF 11/11 MRSA PCR: not detected 11/11 HIV: neg 11/11 TB: IP   Onnie Boer, PharmD, BCIDP, AAHIVP, CPP Infectious Disease Pharmacist 01/16/2021 9:52 AM

## 2021-01-16 NOTE — H&P (Signed)
Chief Complaint: Patient was seen in consultation today for lung mass biopsy  at the request of Dr. Freda Jackson  Referring Physician(s): Dr. Freda Jackson  Supervising Physician: Juliet Rude  Patient Status: [facility] - In-pt  History of Present Illness: Brett Bean is a 75 y.o. male with PMH of acute respiratory failure, CAP, empyema and HLD. Pt presented to ED 01/12/21 c/o fevers, chills, coughing, night sweats and intermittent hemoptysis. Pt underwent CT chest and MRI at that time that found large mass in the right lower lobe of lung as well as osseous lesions suspicious for metastatic disease. Dr. Freda Jackson referred pt to IR for lung mass biopsy, approved by Dr. Denna Haggard.   MR Abd 01/16/21: IMPRESSION: 1. Examination is technically very limited as detailed above. 2. Within this limitation, a lesion of the anterior superior pole of the left kidney identified by prior CT of the chest is of T1 and T2 intermediate signal, without evidence of associated contrast enhancement, measuring approximately 1.6 x 1.5 cm. This is most consistent with a hemorrhagic or proteinaceous cyst. 3. Multiple contrast enhancing osseous lesions throughout the included spine and pelvis, particularly of the T10, T11-L2, and L4 vertebral bodies. These do not clearly correspond to benign, trabeculated hemangiomata on prior CT and are highly suspicious for osseous metastatic disease. 4. Large mass in the lower right chest, partially imaged and better evaluated by recent CT.  Past Medical History:  Diagnosis Date   Hyperlipidemia     Past Surgical History:  Procedure Laterality Date   NO PAST SURGERIES      Allergies: Asa [aspirin]  Medications: Prior to Admission medications   Medication Sig Start Date End Date Taking? Authorizing Provider  acetaminophen (TYLENOL) 500 MG tablet Take 1,000 mg by mouth every 6 (six) hours as needed for moderate pain or fever.   Yes [provider]  cholecalciferol (VITAMIN D) 25 MCG (1000 UNIT) tablet Take 1,000 Units by mouth daily.   Yes [provider]  Lactobacillus (PROBIOTIC ACIDOPHILUS) CAPS Take 1 capsule by mouth daily.   Yes [provider]  Misc Natural Products (PROSTATE) CAPS Take 1 capsule by mouth in the morning and at bedtime. supplement   Yes [provider]  Multiple Vitamins-Minerals (EYE VITAMINS PO) Take 1 tablet by mouth daily. supplement   Yes [provider]     Family History  Problem Relation Age of Onset   Cancer Mother    Heart disease Father    Heart failure Father    Heart murmur Father    Hypertension Sister     Social History   Socioeconomic History   Marital status: Married    Spouse name: Not on file   Number of children: Not on file   Years of education: Not on file   Highest education level: Not on file  Occupational History   Not on file  Tobacco Use   Smoking status: Former    Types: Cigarettes   Smokeless tobacco: Never  Substance and Sexual Activity   Alcohol use: Never   Drug use: Never   Sexual activity: Not on file  Other Topics Concern   Not on file  Social History Narrative   Not on file   Social Determinants of Health   Financial Resource Strain: Not on file  Food Insecurity: Not on file  Transportation Needs: Not on file  Physical Activity: Not on file  Stress: Not on file  Social Connections: Not on file  Review of Systems: A 12 point ROS discussed and pertinent positives are indicated in the HPI above.  All other systems are negative.  Review of Systems  Constitutional:  Positive for fever. Negative for appetite change and chills.  HENT:  Negative for nosebleeds.   Eyes:  Negative for visual disturbance.  Respiratory:  Positive for cough. Negative for shortness of breath.   Cardiovascular:  Positive for chest pain and leg swelling. Negative for palpitations.  Gastrointestinal:  Negative for abdominal  pain, blood in stool, nausea and vomiting.  Genitourinary:  Negative for hematuria.  Neurological:  Negative for dizziness, light-headedness and headaches.   Vital Signs: BP 136/84 (BP Location: Right Arm)   Pulse 100   Temp 98.7 F (37.1 C) (Oral)   Resp 20   Ht 5\' 8"  (1.727 m)   Wt 167 lb 12.3 oz (76.1 kg)   SpO2 90%   BMI 25.51 kg/m   Physical Exam Constitutional:      Appearance: Normal appearance. He is not ill-appearing.  HENT:     Head: Normocephalic and atraumatic.     Mouth/Throat:     Mouth: Mucous membranes are moist.     Pharynx: Oropharynx is clear.  Eyes:     Pupils: Pupils are equal, round, and reactive to light.  Cardiovascular:     Rate and Rhythm: Normal rate. Rhythm irregular.     Pulses: Normal pulses.     Heart sounds: No murmur heard.   No friction rub. No gallop.  Pulmonary:     Effort: Pulmonary effort is normal. No respiratory distress.     Breath sounds: Normal breath sounds. No stridor. No wheezing, rhonchi or rales.  Abdominal:     General: Bowel sounds are normal. There is no distension.     Tenderness: There is no abdominal tenderness. There is no guarding.  Musculoskeletal:     Right lower leg: No edema.     Left lower leg: No edema.  Skin:    General: Skin is warm and dry.  Neurological:     Mental Status: He is alert and oriented to person, place, and time.  Psychiatric:        Mood and Affect: Mood normal.        Behavior: Behavior normal.        Thought Content: Thought content normal.        Judgment: Judgment normal.    Imaging: DG Chest 2 View  Result Date: 01/16/2021 CLINICAL DATA:  Persistent pneumonia EXAM: CHEST - 2 VIEW COMPARISON:  Chest radiograph 01/12/2021, CT chest 01/13/2021 FINDINGS: The cardiomediastinal silhouette is grossly stable. Lung volumes are low. There is persistent confluent opacity projecting over the right lower lobe with silhouetting of the lateral portion of the right hemidiaphragm. The density  appears increased compared to the study from 01/12/2021. The right upper lung remains well aerated. The left lung is clear. There is a possible small right pleural effusion. There is no significant left effusion. There is no pneumothorax. The bones are stable. IMPRESSION: 1. Persistent confluent opacity projecting over the right lower lobe. Differential is unchanged, including infection or neoplasm. 2. Small right pleural effusion, not significantly changed Electronically Signed   By: Valetta Mole M.D.   On: 01/16/2021 08:40   DG Chest 2 View  Result Date: 01/12/2021 CLINICAL DATA:  Shortness of breath EXAM: CHEST - 2 VIEW COMPARISON:  Radiograph 01/03/2021 FINDINGS: Unchanged cardiomediastinal silhouette. Persistent right basilar opacities and small right pleural effusion. Left lung is  clear. No visible pneumothorax. No acute osseous abnormalities. IMPRESSION: Persistent right basilar consolidation and small right pleural effusion. Electronically Signed   By: Maurine Simmering M.D.   On: 01/12/2021 16:33   DG Chest 2 View  Result Date: 01/03/2021 CLINICAL DATA:  Productive cough.  Pneumonia follow-up. EXAM: CHEST - 2 VIEW COMPARISON:  Chest x-ray dated December 18, 2020. FINDINGS: The heart size and mediastinal contours are within normal limits. Slightly decreased small right pleural effusion. Continued right lower lobe opacity and right hemidiaphragm elevation. The left lung is clear. No pneumothorax. No acute osseous abnormality. IMPRESSION: 1. Slightly decreased small right pleural effusion. 2. Continued right lower lobe atelectasis versus infiltrate. Electronically Signed   By: Titus Dubin M.D.   On: 01/03/2021 11:00   CT Chest Wo Contrast  Result Date: 01/13/2021 CLINICAL DATA:  Pneumonia, unresolved EXAM: CT CHEST WITHOUT CONTRAST TECHNIQUE: Multidetector CT imaging of the chest was performed following the standard protocol without IV contrast. COMPARISON:  Chest radiographs dated 01/12/2021. CTA  chest dated 12/14/2020. FINDINGS: Cardiovascular: The heart is normal in size. No pericardial effusion. No evidence of thoracic aortic aneurysm. Mediastinum/Nodes: Small mediastinal lymph nodes, including a dominant 18 mm short axis subcarinal node (series 3/image 86). Visualized thyroid is unremarkable. Lungs/Pleura: 10.2 x 6.6 x 6.5 cm bilobed mass along the inferior right middle and lower lobes (series 3/image 96), previously 10.6 x 8.2 x 6.2 cm. Given the slight decrease in measuring size, this could still reflect severe infection/pneumonia, but primary bronchogenic neoplasm is certainly not excluded. New patchy/nodular opacity in the medial right upper lobe (series 4/image 59). New 10 mm nodule in the anterior left upper lobe (series 4/image 77). New 2.7 cm nodule in the medial left upper lobe/perihilar region (series 4/image 90). Differential consideration includes multifocal infection versus metastatic disease. Associated small right pleural effusion, unchanged. No pneumothorax. Upper Abdomen: New 15 mm lesion along the anterior left upper kidney (series 3/image 172), not clearly evident on recent CT. 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 Suspected vertebral hemangioma along the anterior aspect of T11. IMPRESSION: 10.2 cm bilobed masslike opacity along the inferior right middle and lower lobes, possibly mildly improved from the prior. While this may reflect severe infection/pneumonia, given persistence, primary bronchogenic neoplasm is not excluded. Bronchoscopy is suggested. New multifocal patchy/nodular opacities in the lungs bilaterally, possibly reflecting multifocal infection versus metastatic disease. Associated mediastinal lymphadenopathy, including an 18 mm short axis subcarinal node. Small right pleural effusion, unchanged. New 15 mm lesion in the anterior left upper kidney, poorly evaluated on unenhanced CT. If this patient has a documented pulmonary malignancy, this can be evaluated at the  time of staging of the abdomen/pelvis. Electronically Signed   By: Julian Hy M.D.   On: 01/13/2021 02:46   MR ABDOMEN W WO CONTRAST  Result Date: 01/16/2021 CLINICAL DATA:  Evaluate incidentally identified superior pole left renal lesion EXAM: MRI ABDOMEN WITHOUT AND WITH CONTRAST TECHNIQUE: Multiplanar multisequence MR imaging of the abdomen was performed both before and after the administration of intravenous contrast. CONTRAST:  7.61mL GADAVIST GADOBUTROL 1 MMOL/ML IV SOLN COMPARISON:  CT chest, 01/13/2021 FINDINGS: Examination is very limited by patient arm positioning, field inhomogeneity, and breath motion artifact throughout. Lower chest: Large mass in the lower right chest, partially imaged and better evaluated by recent prior CT. Hepatobiliary: No mass or other parenchymal abnormality identified. Status post cholecystectomy. No biliary ductal dilatation. Pancreas: No mass, inflammatory changes, or other parenchymal abnormality identified. No pancreatic ductal dilatation. Spleen:  Within normal  limits in size and appearance. Adrenals/Urinary Tract: No masses identified. A lesion of the anterior superior pole of the left kidney identified by prior CT is of T1 and T2 intermediate signal, without evidence of associated contrast enhancement, measuring approximately 1.6 x 1.5 cm (series 1004, image 69). Additional nonenhancing cysts bilaterally, some of which may be thinly septated. No evidence of hydronephrosis. Stomach/Bowel: Visualized portions within the abdomen are unremarkable. Vascular/Lymphatic: No pathologically enlarged lymph nodes identified. No abdominal aortic aneurysm demonstrated. Other:  Anasarca. Musculoskeletal: Multiple contrast enhancing osseous lesions throughout the included spine and pelvis, particularly of the T10, T11-L2, and L4 vertebral bodies. IMPRESSION: 1. Examination is technically very limited as detailed above. 2. Within this limitation, a lesion of the anterior  superior pole of the left kidney identified by prior CT of the chest is of T1 and T2 intermediate signal, without evidence of associated contrast enhancement, measuring approximately 1.6 x 1.5 cm. This is most consistent with a hemorrhagic or proteinaceous cyst. 3. Multiple contrast enhancing osseous lesions throughout the included spine and pelvis, particularly of the T10, T11-L2, and L4 vertebral bodies. These do not clearly correspond to benign, trabeculated hemangiomata on prior CT and are highly suspicious for osseous metastatic disease. 4. Large mass in the lower right chest, partially imaged and better evaluated by recent CT. Electronically Signed   By: Delanna Ahmadi M.D.   On: 01/16/2021 10:09   DG CHEST PORT 1 VIEW  Result Date: 12/18/2020 CLINICAL DATA:  Empyema EXAM: PORTABLE CHEST 1 VIEW COMPARISON:  Chest x-rays dated 12/17/2020 and 12/16/2020. FINDINGS: Stable opacity at the RIGHT lung base. LEFT lung remains clear. Stable cardiomegaly. No pneumothorax is seen. Osseous structures about the chest are unremarkable. IMPRESSION: Stable chest x-ray. Stable opacity at the RIGHT lung base, including pleural effusion, compatible with the given history of empyema. Electronically Signed   By: Franki Cabot M.D.   On: 12/18/2020 08:45    Labs:  CBC: Recent Labs    01/12/21 1603 01/14/21 0252 01/15/21 0311 01/16/21 0157  WBC 25.0* 26.1* 26.4* 25.9*  HGB 10.5* 9.1* 8.1* 8.1*  HCT 33.8* 29.6* 25.1* 25.8*  PLT 474* 467* 434* 459*    COAGS: Recent Labs    12/15/20 1111  INR 1.4*    BMP: Recent Labs    01/12/21 1603 01/14/21 0252 01/15/21 0311 01/16/21 0157  NA 133* 130* 128* 131*  K 4.3 3.7 4.0 3.8  CL 99 98 98 101  CO2 25 23 22 22   GLUCOSE 107* 114* 104* 106*  BUN 9 8 8 9   CALCIUM 9.1 8.6* 8.1* 8.4*  CREATININE 0.94 0.81 0.85 0.82  GFRNONAA >60 >60 >60 >60    LIVER FUNCTION TESTS: Recent Labs    12/14/20 1546 12/15/20 0050  BILITOT 1.2 1.2  AST 65* 54*  ALT 68*  65*  ALKPHOS 143* 147*  PROT 6.9 6.9  ALBUMIN 2.2* 2.4*    TUMOR MARKERS: No results for input(s): AFPTM, CEA, CA199, CHROMGRNA in the last 8760 hours.  Assessment and Plan:  History of acute respiratory failure, CAP, empyema and HLD. Pt presented to ED 01/12/21 c/o fevers, chills, coughing, night sweats and intermittent hemoptysis. Pt underwent CT chest and MRI at that time that found large mass in the right lower lobe of lung as well as osseous lesions suspicious for metastatic disease. Dr. Freda Jackson referred pt to IR for lung mass biopsy, approved by Dr. Denna Haggard.  Pt sitting upright in recliner with wife.  He is is A&O, calm  and pleasant. He is in no distress.  Pt notified that he will be NPO the day of the procedure.  VSS.  No thinners noted WBC elevated 25.9 Hgb 8.1  LFTs elevated INR 1.4    Risks and benefits of lung mass biopsy was discussed with the patient and/or patient's family including, but not limited to bleeding, infection, damage to adjacent structures or low yield requiring additional tests.  All of the questions were answered and there is agreement to proceed.  Consent signed and in chart.   Thank you for this interesting consult.  I greatly enjoyed meeting ARAFAT COCUZZA and look forward to participating in their care.  A copy of this report was sent to the requesting provider on this date.  Electronically Signed: Tyson Alias, NP 01/16/2021, 1:17 PM   I spent a total of 30 minutes in face to face in clinical consultation, greater than 50% of which was counseling/coordinating care for image guided lung mass biopsy.

## 2021-01-16 NOTE — Progress Notes (Signed)
PROGRESS NOTE  Brett Bean BSJ:[id number] DOB: [birth year] DOA: 01/12/2021 PCP: Iona Beard, MD  Brief History    This 75 years old male with PMH significant for hyperlipidemia, elevated PSA being followed by primary care physician admitted last month for pneumonia and discharged on Augmentin.  Patient has been having subjective feeling of fever associated with chills and productive cough at some time hemoptysis and weight loss of 20 pounds over last 1 month.  Patient is admitted for multifocal pneumonia,  started on antibiotics.  CT chest showed lung mass.  Pulmonology was consulted recommended to continue antibiotics and repeat imaging in 3 to 4 days to see if there is any improvement.  if there is no improvement Patient might require bronchoscopy and lung biopsy.  Patient was seen and examined at bedside.  The patient states that he is feeling better.  MRI of the abdomen to evaluate left renal lesion demonstrated a renal cyst that may be proteinaceous or hemorrhagic. This study also demonstrated multiple contrast enhancing osseous lesions throughout the included spine and pelvis of the T10, T11-L2, and L4 vertebral bodies. These are highly suspicious for osseous metastatic disease.   As this raises the suspicion that the lung mass is malignant, Dr. Erin Fulling has consulted IR to perform biopsy under CT guidance. The patient and his wife have agreed to the procedure.  Consultants  Pulmonology  Procedures  None  Antibiotics   Anti-infectives (From admission, onward)    Start     Dose/Rate Route Frequency Ordered Stop   01/14/21 0400  vancomycin (VANCOREADY) IVPB 1500 mg/300 mL  Status:  Discontinued        1,500 mg 150 mL/hr over 120 Minutes Intravenous Every 24 hours 01/13/21 0429 01/14/21 1243   01/13/21 1130  ceFEPIme (MAXIPIME) 2 g in sodium chloride 0.9 % 100 mL IVPB        2 g 200 mL/hr over 30 Minutes Intravenous Every 8 hours 01/13/21 0429     01/13/21 0415  azithromycin  (ZITHROMAX) 500 mg in sodium chloride 0.9 % 250 mL IVPB        500 mg 250 mL/hr over 60 Minutes Intravenous Every 24 hours 01/13/21 0402 01/18/21 0414   01/13/21 0315  vancomycin (VANCOREADY) IVPB 1000 mg/200 mL  Status:  Discontinued        1,000 mg 200 mL/hr over 60 Minutes Intravenous  Once 01/13/21 0305 01/13/21 0307   01/13/21 0315  ceFEPIme (MAXIPIME) 2 g in sodium chloride 0.9 % 100 mL IVPB        2 g 200 mL/hr over 30 Minutes Intravenous  Once 01/13/21 0305 01/13/21 0407   01/13/21 0315  vancomycin (VANCOREADY) IVPB 1500 mg/300 mL        1,500 mg 150 mL/hr over 120 Minutes Intravenous  Once 01/13/21 0308 01/13/21 0635        Subjective  The patient is resting comfortably. No new complaints.   Objective   Vitals:  Vitals:   01/16/21 1315 01/16/21 1615  BP: 112/63 118/81  Pulse: 100 (!) 102  Resp: 20 20  Temp: 98.4 F (36.9 C) 98 F (36.7 C)  SpO2: 92% 92%    Exam:  Constitutional:  The patient is awake, alert, and oriented x 3. No acute distress. Respiratory:  No increased work of breathing. Positive for scattered rales No wheezes or rales No tactile fremitus Cardiovascular:  Regular rate and rhythm No murmurs, ectopy, or gallups. No lateral PMI. No thrills. Abdomen:  Abdomen is soft, non-tender,  non-distended No hernias, masses, or organomegaly Normoactive bowel sounds.  Musculoskeletal:  No cyanosis, clubbing, or edema Skin:  No rashes, lesions, ulcers palpation of skin: no induration or nodules Neurologic:  CN 2-12 intact Sensation all 4 extremities intact Psychiatric:  Mental status Mood, affect appropriate Orientation to person, place, time  judgment and insight appear intact I have personally reviewed the following:   Today's Data  Vitals  Lab Data  BMP, CBC  Micro Data  Sputum culture is pending Blood cultures x 2 have had no growth  Imaging  CT chest  Cardiology Data  EKG  Scheduled Meds:  multivitamin with minerals  1  tablet Oral Daily   Continuous Infusions:  azithromycin 500 mg (01/16/21 0332)   ceFEPime (MAXIPIME) IV 2 g (01/16/21 1614)   Patient Active Problem List   Diagnosis Date Noted   Malnutrition of moderate degree 01/14/2021   Lung mass 01/14/2021   Normochromic normocytic anemia 01/13/2021   Empyema, right ([place]) 12/18/2020   Acute respiratory failure with hypoxia ([place]) 12/15/2020   CAP (community acquired pneumonia) 12/15/2020   Pleural effusion on right 12/15/2020      LOS: 3 days   A & P  Pneumonia -patient has been empirically placed on antibiotics for pneumonia at this time vancomycin cefepime and Zithromax.  We will get sputum cultures.  There is also concern for possible bronchogenic mass with possible metastatic lesions for which we will consult pulmonary critical care for possible bronchoscope. Lung Mass: Right middle and lower lobes demonstrate a bilobed mass. Although this could represent infection, concern is for a bronchogenic neoplasm. Pulmonology has been consulted and has discussed biopsy under CT guidance with IR. Patient will go for biopsy with IR tomorrow. MRI abdomen has demonstrated multiple contrast enhancing osseous lesions throughout the included spine and pelvis of the T10, T11-L2, and L4 vertebral bodies. These are highly suspicious for osseous metastatic disease. This raises suspicion that the lung abnormality actually represents a malilgnancy. Renal mass: MRI demonstrates this as either a hemorrhagic or proteinaceous cyst. Normocytic normochromic anemia has been persistent since last admission.  Follow CBC check anemia panel with next blood draw. Hemoglobin is down to 8.1 from 10.5 on admission. Monitor. Transfuse for hemoglobin less than 7.0. History of elevated PSA being followed by primary care physician. Moderate malnutrition: Patient has cachexia with low albumin. Nutrition has been consulted.  I have seen and examined this patient myself. I have spent 38  minutes in his evaluation and care.    DVT prophylaxis: SCDs.  Avoiding anticoagulation anticipation of possible procedures for the lung lesions and also patient is having hemoptysis. Code Status: Full code. Family Communication: Patient's wife at the bedside. Disposition Plan: Home.   Jenness Stemler, DO [facility] Direct contact: see [email or web address]  7PM-7AM contact night coverage as above 01/16/2021, 5:42 PM  LOS: 1 day

## 2021-01-17 ENCOUNTER — Inpatient Hospital Stay (HOSPITAL_COMMUNITY): Payer: Medicare Other

## 2021-01-17 ENCOUNTER — Encounter (HOSPITAL_COMMUNITY)
Admission: EM | Disposition: A | Discharge status: Home / Self Care | Payer: Self-pay | Source: Home / Self Care | Attending: Internal Medicine

## 2021-01-17 DIAGNOSIS — D649 Anemia, unspecified: Secondary | ICD-10-CM | POA: Diagnosis not present

## 2021-01-17 DIAGNOSIS — J189 Pneumonia, unspecified organism: Secondary | ICD-10-CM | POA: Diagnosis not present

## 2021-01-17 DIAGNOSIS — R918 Other nonspecific abnormal finding of lung field: Secondary | ICD-10-CM | POA: Diagnosis not present

## 2021-01-17 DIAGNOSIS — J939 Pneumothorax, unspecified: Secondary | ICD-10-CM

## 2021-01-17 DIAGNOSIS — E440 Moderate protein-calorie malnutrition: Secondary | ICD-10-CM | POA: Diagnosis not present

## 2021-01-17 LAB — PROTIME-INR
INR: 1.5 — ABNORMAL HIGH (ref 0.8–1.2)
Prothrombin Time: 17.7 seconds — ABNORMAL HIGH (ref 11.4–15.2)

## 2021-01-17 LAB — CBC
HCT: 25.7 % — ABNORMAL LOW (ref 39.0–52.0)
Hemoglobin: 8.2 g/dL — ABNORMAL LOW (ref 13.0–17.0)
MCH: 28.5 pg (ref 26.0–34.0)
MCHC: 31.9 g/dL (ref 30.0–36.0)
MCV: 89.2 fL (ref 80.0–100.0)
Platelets: 417 10*3/uL — ABNORMAL HIGH (ref 150–400)
RBC: 2.88 MIL/uL — ABNORMAL LOW (ref 4.22–5.81)
RDW: 14.5 % (ref 11.5–15.5)
WBC: 26.8 10*3/uL — ABNORMAL HIGH (ref 4.0–10.5)
nRBC: 0 % (ref 0.0–0.2)

## 2021-01-17 SURGERY — BRONCHOSCOPY, WITH FLUOROSCOPY
Anesthesia: General

## 2021-01-17 MED ORDER — LIDOCAINE HCL 1 % IJ SOLN
INTRAMUSCULAR | Status: AC
  Filled 2021-01-17: qty 10

## 2021-01-17 MED ORDER — MIDAZOLAM HCL 2 MG/2ML IJ SOLN
INTRAMUSCULAR | Status: AC
  Filled 2021-01-17: qty 2

## 2021-01-17 MED ORDER — FENTANYL CITRATE (PF) 100 MCG/2ML IJ SOLN
INTRAMUSCULAR | Status: AC
  Filled 2021-01-17: qty 2

## 2021-01-17 MED ORDER — FENTANYL CITRATE (PF) 100 MCG/2ML IJ SOLN
INTRAMUSCULAR | Status: AC | PRN
  Administered 2021-01-17: 50 ug via INTRAVENOUS

## 2021-01-17 MED ORDER — MIDAZOLAM HCL 2 MG/2ML IJ SOLN
INTRAMUSCULAR | Status: AC | PRN
  Administered 2021-01-17: 1 mg via INTRAVENOUS

## 2021-01-17 NOTE — Progress Notes (Signed)
Pt back to unit from biopsy right  lung  A/O x 4, pt oriented to unit,Will continue to monitor.   Phoebe Sharps, RN

## 2021-01-17 NOTE — Progress Notes (Signed)
NAME:  Brett Bean, MRN:  [id number], DOB:  1945/08/26, LOS: 4 ADMISSION DATE:  01/12/2021, CONSULTATION DATE:  01/13/2021 REFERRING MD:  Dr. Hal Hope, [facility], CHIEF COMPLAINT:  Chest pain   History of Present Illness:  75 yo male with remote history of smoking was in hospital from 12/14/20 to 12/18/20.  He presented with chest pain and fever 101F associated with productive cough with episodes of hemoptysis.  Found to have Rt sided infiltrate and pleural effusion with loculated components.  He was started on antibiotics and had assessment by thoracic surgery.  Repeat CT chest showed free flowing fluid and chest tube placement was deferred.  He was discharged home on augmentin, and took his last dose on 01/12/21.  He initially felt improvement in his symptoms after hospital discharge, but then started developing intermittent fever again up to 102F.  This has been associated with sweats.  He has lost about 25 lbs over the past 1 month.  He is coughing more sputum, and has yellow-green appearance with intermittent episodes of hemoptysis.  He has recurrence of right sided pleuritic type chest pain.  This prompted his return to the ER.  He quit smoking 40 yrs ago.  No prior history of pneumonia or exposure to tuberculosis.  He is from [place].  Was never in the [employer]  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  He denies symptoms of aspiration or reflux, or dental issues (he has upper dentures).  Pertinent  Medical History  HLD  Studies:  CT angio chest 12/15/20 >> moderate ATX/infiltrate in RUL/RML/RLL, mod to large Rt pleural effusion with loculated components, elevated of Rt hemidiaphragm, small hiatal hernia CT chest 12/16/20 >> small Rt pleural effusion is free flowing CT chest 01/13/21 >> 10.2 x 6.6 x 6.5 cm bilobed mass along inferior RML and RLL (was 10.6 x 8.2 x 6.2 cm), new patchy nodular opacity medial RUL, new 10 mm anterior LUL nodule, new 2.7 cm nodule in medial LUL, small Rt  effusion, new 15 mm lesion along anterior Lt upper kidney  Interim History / Subjective:   He had night sweats over night. Coughing up greenish sputum.   He is having CT guided lung biopsy today with IR.  Objective   Blood pressure 120/77, pulse 93, temperature 98.1 F (36.7 C), temperature source Oral, resp. rate 20, height 5\' 8"  (1.727 m), weight 76.1 kg, SpO2 92 %.        Intake/Output Summary (Last 24 hours) at 01/17/2021 0901 Last data filed at 01/16/2021 1826 Gross per 24 hour  Intake 720 ml  Output --  Net 720 ml   Filed Weights   01/12/21 1559 01/13/21 1649  Weight: 80 kg 76.1 kg   Examination: Gen:      Elderly male, no acute distress HEENT:  [place]/AT, sclera anicteric, moist mucous membranes Lungs:    diminished breath sounds right base, no wheezing or rhonchi CV:         Regular rate and rhythm; no murmurs Abd:      + bowel sounds; soft, non-tender; no palpable masses, no distension Ext:    No edema; adequate peripheral perfusion Skin:      Warm and dry; no rash Neuro: alert and oriented x 3 Psych: normal mood and affect   Discussion:  He has persistent cough with sputum, intermittent hemoptysis, fever up to 102F, intermittent sweats, right pleuritic chest pain, and weight loss.  This is associated with leukocytosis.  Main concern is still for atypical  infectious process vs malignancy   Assessment & Plan:   Non resolving pulmonary infiltrates with concern for atypical pneumonia versus malignancy. Has persistent right base infiltrate for a month with ongoing intermittent hemoptysis, fevers Treating for pneumonia but malignancy still in the differential Continue cefepime for 7 day course, zithromax completed 11/15 Repeat sputum cultures, respiratory and AFB culture. Plan for ct guided biopsy of RLL mass today with IR Surgical path, cytology, tissue culture for fungal/afb/aerobic/anaerobic orders placed for right lower lobe lung biopsy samples by IR  HIV test is  negative.  Quantiferon gold pending  15 mm lesion anterior Lt upper kidney. MRI abdomen 11/14, more concerning for cyst  Will continue to follow.  Please call with any questions  Signature:   Freda Jackson, MD [facility] Office: [phone]   See Amion for personal pager PCCM on call pager [phone] until 7pm. Please call Elink 7p-7a. [phone]

## 2021-01-17 NOTE — Procedures (Signed)
Interventional Radiology Procedure Note  Procedure: CT guided biopsy of right lower lobe mass/consolidation  Complications: Trace pneumothorax  Recommendations: - Bedrest until CXR cleared.  Minimize talking, coughing or otherwise straining.  - Follow up 1 hr CXR pending     Ruthann Cancer, MD Pager: [phone]

## 2021-01-17 NOTE — Progress Notes (Signed)
PROGRESS NOTE  Brett Bean QIW:[id number] DOB: 01/14/1946 DOA: 01/12/2021 PCP: Iona Beard, MD  Brief History    This 75 years old male with PMH significant for hyperlipidemia, elevated PSA being followed by primary care physician admitted last month for pneumonia and discharged on Augmentin.  Patient has been having subjective feeling of fever associated with chills and productive cough at some time hemoptysis and weight loss of 20 pounds over last 1 month.  Patient is admitted for multifocal pneumonia,  started on antibiotics.  CT chest showed lung mass.  Pulmonology was consulted recommended to continue antibiotics and repeat imaging in 3 to 4 days to see if there is any improvement.  if there is no improvement Patient might require bronchoscopy and lung biopsy.  Patient was seen and examined at bedside.  The patient states that he is feeling better.  MRI of the abdomen to evaluate left renal lesion demonstrated a renal cyst that may be proteinaceous or hemorrhagic. This study also demonstrated multiple contrast enhancing osseous lesions throughout the included spine and pelvis of the T10, T11-L2, and L4 vertebral bodies. These are highly suspicious for osseous metastatic disease.   As this raises the suspicion that the lung mass is malignant, Dr. Erin Fulling has consulted IR to perform biopsy under CT guidance. The patient and his wife have agreed to the procedure.  The patient was found to have developed a small pneumothorax following the procedure. It is still present this afternoon after repeat CXR. Pt is on O2. Will repeat CT again this afternoon.  Consultants  Pulmonology  Procedures  None  Antibiotics   Anti-infectives (From admission, onward)    Start     Dose/Rate Route Frequency Ordered Stop   01/14/21 0400  vancomycin (VANCOREADY) IVPB 1500 mg/300 mL  Status:  Discontinued        1,500 mg 150 mL/hr over 120 Minutes Intravenous Every 24 hours 01/13/21 0429 01/14/21 1243    01/13/21 1130  ceFEPIme (MAXIPIME) 2 g in sodium chloride 0.9 % 100 mL IVPB        2 g 200 mL/hr over 30 Minutes Intravenous Every 8 hours 01/13/21 0429 01/20/21 1200   01/13/21 0415  azithromycin (ZITHROMAX) 500 mg in sodium chloride 0.9 % 250 mL IVPB        500 mg 250 mL/hr over 60 Minutes Intravenous Every 24 hours 01/13/21 0402 01/17/21 0555   01/13/21 0315  vancomycin (VANCOREADY) IVPB 1000 mg/200 mL  Status:  Discontinued        1,000 mg 200 mL/hr over 60 Minutes Intravenous  Once 01/13/21 0305 01/13/21 0307   01/13/21 0315  ceFEPIme (MAXIPIME) 2 g in sodium chloride 0.9 % 100 mL IVPB        2 g 200 mL/hr over 30 Minutes Intravenous  Once 01/13/21 0305 01/13/21 0407   01/13/21 0315  vancomycin (VANCOREADY) IVPB 1500 mg/300 mL        1,500 mg 150 mL/hr over 120 Minutes Intravenous  Once 01/13/21 0308 01/13/21 0635        Subjective  The patient is resting comfortably. No new complaints.   Objective   Vitals:  Vitals:   01/17/21 1113 01/17/21 1225  BP: 123/86   Pulse: 94 93  Resp: (!) 25   Temp: 98 F (36.7 C)   SpO2: 93% 98%    Exam:  Constitutional:  The patient is awake, alert, and oriented x 3. No acute distress. Respiratory:  No increased work of breathing. Positive for scattered  rales No wheezes or rales No tactile fremitus Cardiovascular:  Regular rate and rhythm No murmurs, ectopy, or gallups. No lateral PMI. No thrills. Abdomen:  Abdomen is soft, non-tender, non-distended No hernias, masses, or organomegaly Normoactive bowel sounds.  Musculoskeletal:  No cyanosis, clubbing, or edema Skin:  No rashes, lesions, ulcers palpation of skin: no induration or nodules Neurologic:  CN 2-12 intact Sensation all 4 extremities intact Psychiatric:  Mental status Mood, affect appropriate Orientation to person, place, time  judgment and insight appear intact I have personally reviewed the following:   Today's Data  Vitals  Lab Data  CBC  Micro  Data  Sputum culture is pending Blood cultures x 2 have had no growth  Imaging  CT chest  Cardiology Data  EKG  Scheduled Meds:  lidocaine       multivitamin with minerals  1 tablet Oral Daily   Continuous Infusions:  ceFEPime (MAXIPIME) IV 2 g (01/17/21 1311)   Patient Active Problem List   Diagnosis Date Noted   Malnutrition of moderate degree 01/14/2021   Lung mass 01/14/2021   Normochromic normocytic anemia 01/13/2021   Empyema, right ([place]) 12/18/2020   Acute respiratory failure with hypoxia ([place]) 12/15/2020   CAP (community acquired pneumonia) 12/15/2020   Pleural effusion on right 12/15/2020      LOS: 4 days   A & P  Pneumonia -patient has been empirically placed on antibiotics for pneumonia at this time vancomycin cefepime and Zithromax.  We will get sputum cultures.  There is also concern for possible bronchogenic mass with possible metastatic lesions for which we will consult pulmonary critical care for possible bronchoscope. Lung Mass: Right middle and lower lobes demonstrate a bilobed mass. Although this could represent infection, concern is for a bronchogenic neoplasm. Pulmonology has been consulted and has discussed biopsy under CT guidance with IR. Patient will go for biopsy with IR tomorrow. MRI abdomen has demonstrated multiple contrast enhancing osseous lesions throughout the included spine and pelvis of the T10, T11-L2, and L4 vertebral bodies. These are highly suspicious for osseous metastatic disease. This raises suspicion that the lung abnormality actually represents a malilgnancy. Pneumothorax: Tiny pneumothorax found following IR biopsy of lung mass. Pt will be on O2. Will recheck CXR tonight and tomorrow morning if PTX is still visible. Renal mass: MRI demonstrates this as either a hemorrhagic or proteinaceous cyst. Normocytic normochromic anemia has been persistent since last admission.  Follow CBC check anemia panel with next blood draw. Hemoglobin is  down to 8.1 from 10.5 on admission. Monitor. Transfuse for hemoglobin less than 7.0. History of elevated PSA being followed by primary care physician. Moderate malnutrition: Patient has cachexia with low albumin. Nutrition has been consulted.  I have seen and examined this patient myself. I have spent 35 minutes in his evaluation and care.    DVT prophylaxis: SCDs.  Avoiding anticoagulation anticipation of possible procedures for the lung lesions and also patient is having hemoptysis. Code Status: Full code. Family Communication: Patient's wife at the bedside. Disposition Plan: Home.  Evaristo Tsuda, DO [facility] Direct contact: see [email or web address]  7PM-7AM contact night coverage as above 01/17/2021, 3:12 PM  LOS: 1 day

## 2021-01-17 NOTE — Care Management Important Message (Signed)
Important Message  Patient Details  Name: Brett Bean MRN: [id number] Date of Birth: September 22, 1945   Medicare Important Message Given:  Yes     [name] 01/17/2021, 11:47 AM

## 2021-01-18 DIAGNOSIS — E440 Moderate protein-calorie malnutrition: Secondary | ICD-10-CM | POA: Diagnosis not present

## 2021-01-18 DIAGNOSIS — D649 Anemia, unspecified: Secondary | ICD-10-CM | POA: Diagnosis not present

## 2021-01-18 DIAGNOSIS — R918 Other nonspecific abnormal finding of lung field: Secondary | ICD-10-CM | POA: Diagnosis not present

## 2021-01-18 DIAGNOSIS — J189 Pneumonia, unspecified organism: Secondary | ICD-10-CM | POA: Diagnosis not present

## 2021-01-18 LAB — FUNGITELL, SERUM: Fungitell Result: <31 pg/mL (ref <80)

## 2021-01-18 LAB — CULTURE, BLOOD (ROUTINE X 2)
Culture: NO GROWTH
Culture: NO GROWTH
Special Requests: ADEQUATE

## 2021-01-18 LAB — LACTATE DEHYDROGENASE: LDH: 138 U/L (ref 98–192)

## 2021-01-18 LAB — C-REACTIVE PROTEIN: CRP: 21 mg/dL — ABNORMAL HIGH (ref <1.0)

## 2021-01-18 LAB — SEDIMENTATION RATE: Sed Rate: 126 mm/hr — ABNORMAL HIGH (ref 0–16)

## 2021-01-18 MED ORDER — BENZONATATE 100 MG PO CAPS: 200.0000 mg | ORAL_CAPSULE | Freq: Three times a day (TID) | ORAL | Status: DC | PRN

## 2021-01-18 MED ORDER — METHYLPREDNISOLONE SODIUM SUCC 40 MG IJ SOLR
40.0000 mg | Freq: Two times a day (BID) | INTRAMUSCULAR | Status: DC
Start: 2021-01-18 — End: 2021-01-20
  Administered 2021-01-18 – 2021-01-20 (×4): 40 mg via INTRAVENOUS
  Filled 2021-01-18 (×4): qty 1

## 2021-01-18 MED ORDER — GUAIFENESIN-DM 100-10 MG/5ML PO SYRP: 5.0000 mL | ORAL_SOLUTION | ORAL | Status: DC | PRN

## 2021-01-18 MED ORDER — IPRATROPIUM-ALBUTEROL 0.5-2.5 (3) MG/3ML IN SOLN: 3.0000 mL | Freq: Four times a day (QID) | RESPIRATORY_TRACT | Status: DC | PRN

## 2021-01-18 NOTE — Progress Notes (Signed)
PROGRESS NOTE    GASPAR FOWLE  QQV:[id number] DOB: 1945/04/29 DOA: 01/12/2021 PCP: Iona Beard, MD    Brief Narrative:  Mr. Limes was admitted to the hospital with the working diagnosis of pneumonia in the setting of right middle and lower lobe mass.   75 year old male past medical history for dyslipidemia and elevated PSA who presented with fevers and chills.  He had a recent hospitalization about a month ago for pneumonia, treated with Augmentin.  At home he continued to have fevers, chills and productive cough, occasional hemoptysis.  +20 pound weight loss in 4 weeks.  Because of persistent symptoms he was brought back to the hospital.  On his initial physical examination his blood pressure was 106/63, heart rate 101, respiratory rate 33, temperature 99.7, oxygen saturation 94%, his lungs had no wheezing or rhonchi, heart S1-S2 present, rhythmic, soft abdomen, no lower extremity edema.  Sodium 133, potassium 4.3, chloride 99, bicarb 25, glucose 107, BUN 9, creatinine 0.94, white count 25, hemoglobin 10.5, hematocrit 33.8, platelets 474 SARS COVID-19 negative.  Chest radiograph with right middle lobe/lower lobe opacity.  CT chest with 10.2 cm masslike opacity along the inferior right middle lobe and lower lobe.  New multifocal patchy/nodular opacities in the lungs bilaterally.  Mediastinal lymphadenopathy. 15 mm lesion in anterior left upper kidney.  EKG 104 bpm, normal axis, normal intervals, sinus rhythm with PACs, no significant ST segment or T wave changes.  Patient was placed on antibiotic therapy. Further work-up with abdomen MRI showed left renal cyst, multiple contrast enhancing osseous lesions throughout including spine and pelvis over the T10-T11/L2 and L4 vertebral bodies.  Highly suspicion for osseous metastatic disease.  Patient underwent IR CT-guided lung biopsy, postprocedure developed small pneumothorax.  Assessment & Plan:   Principal Problem:   CAP (community  acquired pneumonia) Active Problems:   Normochromic normocytic anemia   Malnutrition of moderate degree   Lung mass   Right middle and right lower lobe mass/ sp IR guided biopsy and complicated with post-procedure pneumothorax. Patient continue to be weak and deconditioned, no worsening dyspnea or chest pain.  His oxygenation is 92% on 1 L/min per [place].  Wbc continue to be elevated at 26,8 from 25,9. Predominant neutrophilic.   Patient has been on cefepime for the last 5 days.  Cultures no growth, sputum culture with normal respiratory flora, patient has been afebrile.  Recent hospitalization 10/12 to 10/16 treated with appropriate antibiotic therapy and discharged on Augmentin.   Plan to follow up on biopsy results For now will discontinue antibiotic therapy and will follow cell count in am, continue to follow on temperature curve.   Add antitussive therapy and as needed duoneb. Airway clearing techniques with flutter valve and incentive spirometer.   Transfer to med surg level of care and discontinue telemetry.   2. Anemia of chronic disease. Stable hgb and hct.   3. Moderate calorie protein malnutrition. Continue with nutritional supplementation Consult Pt and Ot, nutrition Out of bed to chair tid with meals  Status is: Inpatient  Remains inpatient appropriate because:  leukocytosis and pending biopsy result    DVT prophylaxis: Enoxaparin   Code Status:    full  Family Communication:  I spoke with patient's family at the bedside, we talked in detail about patient's condition, plan of care and prognosis and all questions were addressed.      Nutrition Status: Nutrition Problem: Moderate Malnutrition Etiology: acute illness (CAP) Signs/Symptoms: mild muscle depletion, mild fat depletion Interventions: Hormel Shake, MVI  Skin Documentation:     Consultants:  Pulmonary  IR   Procedures:  IR lung biopsy   Antimicrobials:  Cefepime      Subjective: Patient continue to be very weak and deconditioned, no nausea or vomiting, no chest pain, continue to have dyspnea and cough.   Objective: Vitals:   01/17/21 2315 01/18/21 0350 01/18/21 0721 01/18/21 1143  BP: 129/78 117/85 115/75 132/69  Pulse: 99 94 90 91  Resp: 20 20 18  (!) 23  Temp: 98.4 F (36.9 C) 98.4 F (36.9 C) 98 F (36.7 C) 98 F (36.7 C)  TempSrc: Oral Oral Oral Oral  SpO2: 98% 98% 96% 92%  Weight:      Height:        Intake/Output Summary (Last 24 hours) at 01/18/2021 1337 Last data filed at 01/18/2021 0350 Gross per 24 hour  Intake --  Output 400 ml  Net -400 ml   Filed Weights   01/12/21 1559 01/13/21 1649  Weight: 80 kg 76.1 kg    Examination:   General: Not in pain or dyspnea, deconditioned  Neurology: Awake and alert, non focal  E ENT: mild pallor, no icterus, oral mucosa moist Cardiovascular: No JVD. S1-S2 present, rhythmic, no gallops, rubs, or murmurs. No lower extremity edema. Pulmonary: positive breath sounds bilaterally,with no wheezing, rhonchi or rales. Decreased breath sounds at the right side  Gastrointestinal. Abdomen soft and non tender Skin. No rashes Musculoskeletal: no joint deformities     Data Reviewed: I have personally reviewed following labs and imaging studies  CBC: Recent Labs  Lab 01/12/21 1603 01/14/21 0252 01/15/21 0311 01/16/21 0157 01/17/21 0153  WBC 25.0* 26.1* 26.4* 25.9* 26.8*  NEUTROABS 19.1*  --  20.7* 20.6*  --   HGB 10.5* 9.1* 8.1* 8.1* 8.2*  HCT 33.8* 29.6* 25.1* 25.8* 25.7*  MCV 93.1 91.4 89.3 89.6 89.2  PLT 474* 467* 434* 459* 979*   Basic Metabolic Panel: Recent Labs  Lab 01/12/21 1603 01/14/21 0252 01/15/21 0311 01/16/21 0157  NA 133* 130* 128* 131*  K 4.3 3.7 4.0 3.8  CL 99 98 98 101  CO2 25 23 22 22   GLUCOSE 107* 114* 104* 106*  BUN 9 8 8 9   CREATININE 0.94 0.81 0.85 0.82  CALCIUM 9.1 8.6* 8.1* 8.4*  MG  --  1.9  --   --   PHOS  --  2.8  --   --     GFR: Estimated Creatinine Clearance: 73 mL/min (by C-G formula based on SCr of 0.82 mg/dL). Liver Function Tests: No results for input(s): AST, ALT, ALKPHOS, BILITOT, PROT, ALBUMIN in the last 168 hours. No results for input(s): LIPASE, AMYLASE in the last 168 hours. No results for input(s): AMMONIA in the last 168 hours. Coagulation Profile: Recent Labs  Lab 01/17/21 0153  INR 1.5*   Cardiac Enzymes: No results for input(s): CKTOTAL, CKMB, CKMBINDEX, TROPONINI in the last 168 hours. BNP (last 3 results) No results for input(s): PROBNP in the last 8760 hours. HbA1C: No results for input(s): HGBA1C in the last 72 hours. CBG: No results for input(s): GLUCAP in the last 168 hours. Lipid Profile: No results for input(s): CHOL, HDL, LDLCALC, TRIG, CHOLHDL, LDLDIRECT in the last 72 hours. Thyroid Function Tests: No results for input(s): TSH, T4TOTAL, FREET4, T3FREE, THYROIDAB in the last 72 hours. Anemia Panel: No results for input(s): VITAMINB12, FOLATE, FERRITIN, TIBC, IRON, RETICCTPCT in the last 72 hours.    Radiology Studies: I have reviewed all of the imaging during  this hospital visit personally     Scheduled Meds:  multivitamin with minerals  1 tablet Oral Daily   Continuous Infusions:  ceFEPime (MAXIPIME) IV 2 g (01/18/21 1143)     LOS: 5 days        Aiden Helzer Gerome Apley, MD

## 2021-01-18 NOTE — Evaluation (Signed)
Physical Therapy Evaluation Patient Details Name: Brett Bean MRN: [id number] DOB: [birth year] Today's Date: 01/18/2021  History of Present Illness  75 y/o male presented to ED on 11/10 for fever, chills, coughing, night sweats, and intermittent hemoptysis. CT chest and MRI found large mass in R lower lobe of lung as well as osseous lesions suspicious for metastatic disease. IR CT guided lung biopsy on 11/15 and developed small pneumothorax. PMH: dyslipidemia, empyema  Clinical Impression  Patient admitted with above diagnosis. Patient presents with weakness and decreased activity tolerance. Patient currently requiring 2L O2 to keep spO2 >90% during ambulation. Patient at supervision level for mobility with no AD. Anticipate patient will progress quickly. Encouraged hallway mobility with nursing staff to improve activity tolerance. Patient will benefit from skilled PT services during acute stay to address listed deficits. No PT follow up recommended at this time.        Recommendations for follow up therapy are one component of a multi-disciplinary discharge planning process, led by the attending physician.  Recommendations may be updated based on patient status, additional functional criteria and insurance authorization.  Follow Up Recommendations No PT follow up    Assistance Recommended at Discharge PRN  Functional Status Assessment Patient has had a recent decline in their functional status and demonstrates the ability to make significant improvements in function in a reasonable and predictable amount of time.  Equipment Recommendations  None recommended by PT    Recommendations for Other Services       Precautions / Restrictions Precautions Precautions: Fall      Mobility  Bed Mobility Overal bed mobility: Independent                  Transfers Overall transfer level: Independent                      Ambulation/Gait Ambulation/Gait assistance:  Supervision Gait Distance (Feet): 400 Feet Assistive device: None Gait Pattern/deviations: Step-through pattern;Decreased stride length Gait velocity: decreased     General Gait Details: SOB noted with spO2 drop to 85% on RA. Donned 2L O2 with increase to 93% with ambulation  Stairs            Wheelchair Mobility    Modified Rankin (Stroke Patients Only)       Balance Overall balance assessment: Mild deficits observed, not formally tested                                           Pertinent Vitals/Pain Pain Assessment: No/denies pain    Home Living Family/patient expects to be discharged to:: Private residence Living Arrangements: Spouse/significant other Available Help at Discharge: Family;Available 24 hours/day Type of Home: House Home Access: Stairs to enter Entrance Stairs-Rails: Right;Left Entrance Stairs-Number of Steps: 2 Alternate Level Stairs-Number of Steps: 12 Home Layout: Multi-level;Laundry or work area in [employer]: None      Prior Function Prior Level of Function : Independent/Modified Independent;Driving                     Journalist, newspaper        Extremity/Trunk Assessment   Upper Extremity Assessment Upper Extremity Assessment: Overall WFL for tasks assessed    Lower Extremity Assessment Lower Extremity Assessment: Generalized weakness    Cervical / Trunk Assessment Cervical / Trunk Assessment: Kyphotic  Communication   Communication: No  difficulties  Cognition Arousal/Alertness: Awake/alert Behavior During Therapy: WFL for tasks assessed/performed Overall Cognitive Status: Within Functional Limits for tasks assessed                                          General Comments      Exercises     Assessment/Plan    PT Assessment Patient needs continued PT services  PT Problem List Decreased strength;Decreased activity tolerance;Cardiopulmonary status limiting  activity       PT Treatment Interventions Gait training;Stair training;Functional mobility training;Therapeutic exercise;Balance training;Therapeutic activities;Patient/family education    PT Goals (Current goals can be found in the Care Plan section)  Acute Rehab PT Goals Patient Stated Goal: to not use oxygen PT Goal Formulation: With patient Time For Goal Achievement: 02/01/21 Potential to Achieve Goals: Good    Frequency Min 3X/week   Barriers to discharge        Co-evaluation               AM-PAC PT "6 Clicks" Mobility  Outcome Measure Help needed turning from your back to your side while in a flat bed without using bedrails?: None Help needed moving from lying on your back to sitting on the side of a flat bed without using bedrails?: None Help needed moving to and from a bed to a chair (including a wheelchair)?: None Help needed standing up from a chair using your arms (e.g., wheelchair or bedside chair)?: None Help needed to walk in hospital room?: A Little Help needed climbing 3-5 steps with a railing? : A Little 6 Click Score: 22    End of Session Equipment Utilized During Treatment: Oxygen Activity Tolerance: Patient tolerated treatment well Patient left: in bed;with call bell/phone within reach Nurse Communication: Mobility status PT Visit Diagnosis: Muscle weakness (generalized) (M62.81)    Time: 9629-5284 PT Time Calculation (min) (ACUTE ONLY): 13 min   Charges:   PT Evaluation $PT Eval Low Complexity: 1 Low          [place] A. Gilford Rile PT, DPT Acute Rehabilitation Services Pager [phone] Office [phone]   Linna Hoff 01/18/2021, 5:57 PM

## 2021-01-18 NOTE — Progress Notes (Signed)
NAME:  Brett Bean, MRN:  [id number], DOB:  28-Jan-1946, LOS: 5 ADMISSION DATE:  01/12/2021, CONSULTATION DATE:  01/13/2021 REFERRING MD:  Dr. Hal Hope, [facility], CHIEF COMPLAINT:  Chest pain   History of Present Illness:  75 yo male with remote history of smoking was in hospital from 12/14/20 to 12/18/20.  He presented with chest pain and fever 101F associated with productive cough with episodes of hemoptysis.  Found to have Rt sided infiltrate and pleural effusion with loculated components.  He was started on antibiotics and had assessment by thoracic surgery.  Repeat CT chest showed free flowing fluid and chest tube placement was deferred.  He was discharged home on augmentin, and took his last dose on 01/12/21.  He initially felt improvement in his symptoms after hospital discharge, but then started developing intermittent fever again up to 102F.  This has been associated with sweats.  He has lost about 25 lbs over the past 1 month.  He is coughing more sputum, and has yellow-green appearance with intermittent episodes of hemoptysis.  He has recurrence of right sided pleuritic type chest pain.  This prompted his return to the ER.  He quit smoking 40 yrs ago.  No prior history of pneumonia or exposure to tuberculosis.  He is from [place].  Was never in the [employer]  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  He denies symptoms of aspiration or reflux, or dental issues (he has upper dentures).  Pertinent  Medical History  HLD  Studies:  CT angio chest 12/15/20 >> moderate ATX/infiltrate in RUL/RML/RLL, mod to large Rt pleural effusion with loculated components, elevated of Rt hemidiaphragm, small hiatal hernia CT chest 12/16/20 >> small Rt pleural effusion is free flowing CT chest 01/13/21 >> 10.2 x 6.6 x 6.5 cm bilobed mass along inferior RML and RLL (was 10.6 x 8.2 x 6.2 cm), new patchy nodular opacity medial RUL, new 10 mm anterior LUL nodule, new 2.7 cm nodule in medial LUL, small Rt  effusion, new 15 mm lesion along anterior Lt upper kidney  Interim History / Subjective:   No issues overnight. Patient feeling well today and interested in going home.  Small pneumothorax noted after right CT guided biopsy with IR yesterday.   Objective   Blood pressure 132/69, pulse 91, temperature 98 F (36.7 C), temperature source Oral, resp. rate (!) 23, height 5\' 8"  (1.727 m), weight 76.1 kg, SpO2 92 %.        Intake/Output Summary (Last 24 hours) at 01/18/2021 1214 Last data filed at 01/18/2021 0350 Gross per 24 hour  Intake --  Output 400 ml  Net -400 ml   Filed Weights   01/12/21 1559 01/13/21 1649  Weight: 80 kg 76.1 kg   Examination: Gen:      Elderly male, no acute distress HEENT:  [place]/AT, sclera anicteric, moist mucous membranes Lungs:    diminished breath sounds right base, no wheezing or rhonchi CV:         Regular rate and rhythm; no murmurs Abd:      + bowel sounds; soft, non-tender; no palpable masses, no distension Ext:    Trace bilateral pre-tibial edema, warm Skin:      Warm and dry; no rash Neuro: alert and oriented x 3 Psych: normal mood and affect   Discussion:  He has persistent cough with sputum, intermittent hemoptysis, fever up to 102F, intermittent sweats, right pleuritic chest pain, and weight loss.  This is associated with leukocytosis and elevated inflammatory  markers.  Main concern is still for atypical infectious process vs malignancy vs inflammatory/organizing pneumonia.  Assessment & Plan:   Non resolving pulmonary infiltrates with concern for atypical pneumonia vs malignancy vs inflammatory/organizing pneumonia. Small Right Apical Pneumothorax Continue cefepime for 7 day course to finish today, zithromax completed 11/15.  Repeat sputum cultures, respiratory and AFB culture ordered 11/15 CT guided biopsy of RLL mass 11/15 completed.  Will follow up pathology results. Unfortunately specimens were not sent to micro as well for tissue  culture and unable to use pathology specimen as it is in formalin. If pathology results indeterminate, he will need another procedure for further tissue to send to micro. HIV test is negative.  Quantiferon gold pending  15 mm lesion anterior Lt upper kidney. MRI abdomen 11/14, more concerning for cyst  Patient is stable to go home today and we will follow up with him about pathology results and further workup/care planning.  Signature:   Freda Jackson, MD [facility] Office: [phone]   See Amion for personal pager PCCM on call pager [phone] until 7pm. Please call Elink 7p-7a. [phone]

## 2021-01-18 NOTE — Progress Notes (Signed)
+  Pharmacy Antibiotic Note  Brett Bean is a 75 y.o. male admitted on 01/12/2021 with pneumonia.  Pharmacy has been consulted for vancomycin and cefepime dosing. Vancomycin 1500mg  and cefepime 2g given in ED.   Wbc continues to be elevated. Cultures remained neg. Pt is s/p lung bx. Plan for 7d of cefepime.   Plan: Cont cefepime 2g q8h for 1 more day Monitor renal function, cultures, and clinical progression Rx signs off  Height: 5\' 8"  (172.7 cm) Weight: 76.1 kg (167 lb 12.3 oz) IBW/kg (Calculated) : 68.4  Temp (24hrs), Avg:98.2 F (36.8 C), Min:98 F (36.7 C), Max:98.4 F (36.9 C)  Recent Labs  Lab 01/12/21 1603 01/13/21 0334 01/14/21 0252 01/15/21 0311 01/16/21 0157 01/17/21 0153  WBC 25.0*  --  26.1* 26.4* 25.9* 26.8*  CREATININE 0.94  --  0.81 0.85 0.82  --   LATICACIDVEN  --  1.0  --   --   --   --      Estimated Creatinine Clearance: 73 mL/min (by C-G formula based on SCr of 0.82 mg/dL).    Allergies  Allergen Reactions   Asa [Aspirin] Nausea And Vomiting    Antimicrobials this admission: Vancomycin 11/11 >>11/12 Cefepime 11/11 >>11/17 Azithromycin 11/11>>11/15  Dose adjustments this admission:  Microbiology results: 11/11 bcx: ngtd 11/11 resp: NF 11/11 MRSA PCR: not detected 11/11 HIV: neg 11/11 Quantiferon gold: IP 11/14 Fungitell>>IP   Onnie Boer, PharmD, BCIDP, AAHIVP, CPP Infectious Disease Pharmacist 01/18/2021 9:50 AM

## 2021-01-18 NOTE — Progress Notes (Addendum)
Patient Saturations on Room Air at Rest = 95% Patient Saturations on Room Air while Ambulating = 84%  Patient Saturation on 2L while ambulating  = 95%

## 2021-01-19 ENCOUNTER — Inpatient Hospital Stay (HOSPITAL_COMMUNITY): Payer: Medicare Other

## 2021-01-19 ENCOUNTER — Telehealth: Payer: Self-pay | Admitting: Pulmonary Disease

## 2021-01-19 DIAGNOSIS — J8489 Other specified interstitial pulmonary diseases: Secondary | ICD-10-CM

## 2021-01-19 DIAGNOSIS — J84116 Cryptogenic organizing pneumonia: Secondary | ICD-10-CM

## 2021-01-19 LAB — CBC WITH DIFFERENTIAL/PLATELET
Abs Immature Granulocytes: 0 10*3/uL (ref 0.00–0.07)
Band Neutrophils: 3 %
Basophils Absolute: 0 10*3/uL (ref 0.0–0.1)
Basophils Relative: 0 %
Eosinophils Absolute: 0 10*3/uL (ref 0.0–0.5)
Eosinophils Relative: 0 %
HCT: 30.6 % — ABNORMAL LOW (ref 39.0–52.0)
Hemoglobin: 9.3 g/dL — ABNORMAL LOW (ref 13.0–17.0)
Lymphocytes Relative: 3 %
Lymphs Abs: 0.8 10*3/uL (ref 0.7–4.0)
MCH: 27.4 pg (ref 26.0–34.0)
MCHC: 30.4 g/dL (ref 30.0–36.0)
MCV: 90.3 fL (ref 80.0–100.0)
Monocytes Absolute: 0.5 10*3/uL (ref 0.1–1.0)
Monocytes Relative: 2 %
Neutro Abs: 24.1 10*3/uL — ABNORMAL HIGH (ref 1.7–7.7)
Neutrophils Relative %: 92 %
Platelets: 579 10*3/uL — ABNORMAL HIGH (ref 150–400)
RBC: 3.39 MIL/uL — ABNORMAL LOW (ref 4.22–5.81)
RDW: 14.6 % (ref 11.5–15.5)
WBC: 25.4 10*3/uL — ABNORMAL HIGH (ref 4.0–10.5)
nRBC: 0 % (ref 0.0–0.2)
nRBC: 0 /100 WBC

## 2021-01-19 LAB — COMPREHENSIVE METABOLIC PANEL
ALT: 61 U/L — ABNORMAL HIGH (ref 0–44)
AST: 33 U/L (ref 15–41)
Albumin: 2.2 g/dL — ABNORMAL LOW (ref 3.5–5.0)
Alkaline Phosphatase: 124 U/L (ref 38–126)
Anion gap: 8 (ref 5–15)
BUN: 13 mg/dL (ref 8–23)
CO2: 26 mmol/L (ref 22–32)
Calcium: 9.3 mg/dL (ref 8.9–10.3)
Chloride: 101 mmol/L (ref 98–111)
Creatinine, Ser: 0.84 mg/dL (ref 0.61–1.24)
GFR, Estimated: >60 mL/min (ref >60)
Glucose, Bld: 156 mg/dL — ABNORMAL HIGH (ref 70–99)
Potassium: 4.2 mmol/L (ref 3.5–5.1)
Sodium: 135 mmol/L (ref 135–145)
Total Bilirubin: 0.7 mg/dL (ref 0.3–1.2)
Total Protein: 7.6 g/dL (ref 6.5–8.1)

## 2021-01-19 LAB — ANCA TITERS
Atypical P-ANCA titer: <1:20 {titer}
C-ANCA: <1:20 {titer}
P-ANCA: <1:20 {titer}

## 2021-01-19 LAB — ANA W/REFLEX IF POSITIVE: Anti Nuclear Antibody (ANA): NEGATIVE

## 2021-01-19 LAB — PSA: Prostatic Specific Antigen: 6.38 ng/mL — ABNORMAL HIGH (ref 0.00–4.00)

## 2021-01-19 MED ORDER — IOHEXOL 300 MG/ML  SOLN
90.0000 mL | Freq: Once | INTRAMUSCULAR | Status: AC | PRN
  Administered 2021-01-19: 23:00:00 90 mL via INTRAVENOUS

## 2021-01-19 NOTE — Evaluation (Signed)
Occupational Therapy Evaluation/Discharge Patient Details Name: Brett Bean MRN: [id number] DOB: Sep 13, 1945 Today's Date: 01/19/2021   History of Present Illness Pt is a 75 y.o. male admitted 01/12/21 for fever, chills, coughing, night sweats, intermittent hemoptysis. 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 IR CT guided lung biopsy on 11/15 and developed small pneumothorax. RLL Lung Biopsy showed acute and organizing pneumonia. PMH includes empyema.   Clinical Impression   PTA, pt lives with spouse and reports complete independence in all daily tasks, active at baseline. Pt presents now at baseline for ADLs/mobility. Pt reports ambulating to/from bathroom Independently and managing ADLs without issues. Noted pt able to mobilize in hallway with PT with SpO2 WFL on RA. Educated re: energy conservation for ADLs/IADLs (handout provided), activity tolerance progression at home, and incentive spirometer use. Pt/wife verbalize understanding of education. No further skilled OT services needed at acute level or on DC. OT to sign off.     Recommendations for follow up therapy are one component of a multi-disciplinary discharge planning process, led by the attending physician.  Recommendations may be updated based on patient status, additional functional criteria and insurance authorization.   Follow Up Recommendations  No OT follow up    Assistance Recommended at Discharge PRN  Functional Status Assessment  Patient has not had a recent decline in their functional status  Equipment Recommendations  None recommended by OT    Recommendations for Other Services       Precautions / Restrictions Precautions Precautions: Other (comment) Precaution Comments: monitor O2 Restrictions Weight Bearing Restrictions: No      Mobility Bed Mobility               General bed mobility comments: Received sitting in chair     Transfers Overall transfer level: Independent Equipment used: None                      Balance Overall balance assessment: No apparent balance deficits (not formally assessed)   Sitting balance-Leahy Scale: Normal       Standing balance-Leahy Scale: Good               High level balance activites: Side stepping;Backward walking;Direction changes;Turns;Sudden stops;Head turns High Level Balance Comments: no overt instability or LOB noted with higher level balance tasks           ADL either performed or assessed with clinical judgement   ADL Overall ADL's : Independent                                       General ADL Comments: no use of AD for mobility with PT in hallway this AM. Has been mobilizing to/from bathroom without assist. Educated on energy conservastion strategies with handout provided with focus on pacing, prioritizing and rest breaks as needed. Encoraged progression to normal activity routine for endurance and lung function, encouraged IS use with pt able to pull 1243mL currently (pt reports able to pull 2000+mL at start of admission     Vision Baseline Vision/History: 1 Wears glasses Ability to See in Adequate Light: 0 Adequate Patient Visual Report: No change from baseline Vision Assessment?: No apparent visual deficits     Perception     Praxis      Pertinent Vitals/Pain Pain Assessment: No/denies pain     Hand  Dominance Right   Extremity/Trunk Assessment Upper Extremity Assessment Upper Extremity Assessment: Overall WFL for tasks assessed (5/5)   Lower Extremity Assessment Lower Extremity Assessment: Overall WFL for tasks assessed   Cervical / Trunk Assessment Cervical / Trunk Assessment: Normal   Communication Communication Communication: No difficulties   Cognition Arousal/Alertness: Awake/alert Behavior During Therapy: WFL for tasks assessed/performed Overall Cognitive Status: Within Functional Limits  for tasks assessed                                       General Comments  spO2 WFL on RA with education, noted WFL during hallway mobility with PT this AM. Wife present and supportive    Exercises Other Exercises Other Exercises: incentive spirometer x5 (pt pulling ~1250-1500 mL)   Shoulder Instructions      Home Living Family/patient expects to be discharged to:: Private residence Living Arrangements: Spouse/significant other Available Help at Discharge: Family;Available 24 hours/day Type of Home: House Home Access: Stairs to enter [employer] of Steps: 2 Entrance Stairs-Rails: Right;Left Home Layout: Multi-level;Laundry or work area in Building surveyor of Steps: 12 Alternate Level Stairs-Rails: Left Bathroom Shower/Tub: Occupational psychologist: None          Prior Functioning/Environment Prior Level of Function : Independent/Modified Independent;Driving             Mobility Comments: no use of AD for mobility ADLs Comments: Independent with ADLs, IADLs, enjoys tinkering in garage. Active with church        OT Problem List:        OT Treatment/Interventions:      OT Goals(Current goals can be found in the care plan section) Acute Rehab OT Goals Patient Stated Goal: go home tomorrow OT Goal Formulation: All assessment and education complete, DC therapy  OT Frequency:     Barriers to D/C:            Co-evaluation              AM-PAC OT "6 Clicks" Daily Activity     Outcome Measure Help from another person eating meals?: None Help from another person taking care of personal grooming?: None Help from another person toileting, which includes using toliet, bedpan, or urinal?: None Help from another person bathing (including washing, rinsing, drying)?: None Help from another person to put on and taking off regular upper body clothing?: None Help from another  person to put on and taking off regular lower body clothing?: None 6 Click Score: 24   End of Session    Activity Tolerance: Patient tolerated treatment well Patient left: in chair;with family/visitor present  OT Visit Diagnosis: Other (comment) (decreased cardiopulmonary tolerance)                Time: 7616-0737 OT Time Calculation (min): 10 min Charges:  OT General Charges $OT Visit: 1 Visit OT Evaluation $OT Eval Low Complexity: 1 Low  Malachy Chamber, OTR/L Acute Rehab Services Office: [phone]   Layla Maw 01/19/2021, 12:10 PM

## 2021-01-19 NOTE — Progress Notes (Signed)
Physical Therapy Treatment & Discharge Patient Details Name: Brett Bean MRN: [id number] DOB: [birth year] Today's Date: 01/19/2021   History of Present Illness Pt is a 75 y.o. male admitted 01/12/21 for fever, chills, coughing, night sweats, intermittent hemoptysis. 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 IR CT guided lung biopsy on 11/15 and developed small pneumothorax. RLL Lung Biopsy showed acute and organizing pneumonia. PMH includes empyema.   PT Comments    Pt progressing with mobility. Today's session focused on gait and stair training, pt independent with mobility and ADL tasks. Educ re: activity recommendations, pulse ox use, O2 needs, incentive spirometer. Pt has met short-term acute PT goals, reports no further questions or concerns; encouraged continued hallway ambulation while admitted. Will d/c acute PT.   SATURATION QUALIFICATIONS:  Patient Saturations on Room Air at Rest = 94% Patient Saturations on Room Air while Ambulating = 92%    Recommendations for follow up therapy are one component of a multi-disciplinary discharge planning process, led by the attending physician.  Recommendations may be updated based on patient status, additional functional criteria and insurance authorization.  Follow Up Recommendations  No PT follow up     Assistance Recommended at Discharge PRN  Equipment Recommendations  None recommended by PT    Recommendations for Other Services       Precautions / Restrictions Precautions Precautions: Fall;Other (comment) Precaution Comments: Watch SpO2 Restrictions Weight Bearing Restrictions: No     Mobility  Bed Mobility               General bed mobility comments: Received sitting in recliner    Transfers Overall transfer level: Independent Equipment used: None                    Ambulation/Gait Ambulation/Gait assistance: Independent Gait  Distance (Feet): 380 Feet Assistive device: None Gait Pattern/deviations: WFL(Within Functional Limits)           Stairs Stairs: Yes Stairs assistance: Modified independent (Device/Increase time) Stair Management: One rail Right;Alternating pattern;Forwards Number of Stairs: 11     Wheelchair Mobility    Modified Rankin (Stroke Patients Only)       Balance Overall balance assessment: Independent   Sitting balance-Leahy Scale: Normal       Standing balance-Leahy Scale: Good               High level balance activites: Side stepping;Backward walking;Direction changes;Turns;Sudden stops;Head turns High Level Balance Comments: no overt instability or LOB noted with higher level balance tasks            Cognition Arousal/Alertness: Awake/alert Behavior During Therapy: WFL for tasks assessed/performed Overall Cognitive Status: Within Functional Limits for tasks assessed                                          Exercises Other Exercises Other Exercises: incentive spirometer x5 (pt pulling ~1250-1500 mL)    General Comments General comments (skin integrity, edema, etc.): SpO2 93-96% on RA at rest, >/92% on RA with ambulation when pleth reliable. Educ pt and wife re: pulse oximeter use, SpO2 values (when to call MD), activity recommendations (cardiovascular exercise), importance of mobility, IS use      Pertinent Vitals/Pain Pain Assessment: No/denies pain    Home Living  Prior Function            PT Goals (current goals can now be found in the care plan section) Progress towards PT goals: Goals met/education completed, patient discharged from PT    Frequency    Min 3X/week      PT Plan Current plan remains appropriate    Co-evaluation              AM-PAC PT "6 Clicks" Mobility   Outcome Measure  Help needed turning from your back to your side while in a flat bed without using  bedrails?: None Help needed moving from lying on your back to sitting on the side of a flat bed without using bedrails?: None Help needed moving to and from a bed to a chair (including a wheelchair)?: None Help needed standing up from a chair using your arms (e.g., wheelchair or bedside chair)?: None Help needed to walk in hospital room?: None Help needed climbing 3-5 steps with a railing? : None 6 Click Score: 24    End of Session   Activity Tolerance: Patient tolerated treatment well Patient left: in chair;with call bell/phone within reach Nurse Communication: Mobility status PT Visit Diagnosis: Muscle weakness (generalized) (M62.81)     Time: 2820-8138 PT Time Calculation (min) (ACUTE ONLY): 20 min  Charges:  $Gait Training: 8-22 mins                     Mabeline Caras, PT, DPT Acute Rehabilitation Services  Pager [phone] Office [place] 01/19/2021, 11:11 AM

## 2021-01-19 NOTE — Progress Notes (Addendum)
Nutrition Follow-up / Consult  DOCUMENTATION CODES:   Non-severe (moderate) malnutrition in context of acute illness/injury  INTERVENTION:   D/C Vital Cuisine shakes. Continue MVI with minerals daily.  NUTRITION DIAGNOSIS:   Moderate Malnutrition related to acute illness (CAP) as evidenced by mild muscle depletion, mild fat depletion.  Ongoing   GOAL:   Patient will meet greater than or equal to 90% of their needs  Met  MONITOR:   PO intake, Supplement acceptance  REASON FOR ASSESSMENT:   Malnutrition Screening Tool    ASSESSMENT:   75 yo male admitted with CAP, normochromic normocytic anemia. PMH includes HLD, elevated PSA, recent PNA.  Patient reports improved appetite and intake of meals. He does not think he needs the Vital Cuisine Shakes with meals. Will D/C.   Remains on a regular diet. Meal intakes: 75-100% (average 96% for the past 8 meals recorded)  Labs and medications reviewed.   NUTRITION - FOCUSED PHYSICAL EXAM:  Flowsheet Row Most Recent Value  Orbital Region No depletion  Upper Arm Region Mild depletion  Thoracic and Lumbar Region Mild depletion  Buccal Region No depletion  Temple Region No depletion  Clavicle Bone Region Mild depletion  Clavicle and Acromion Bone Region Mild depletion  Scapular Bone Region No depletion  Dorsal Hand Mild depletion  Patellar Region Mild depletion  Anterior Thigh Region Mild depletion  Posterior Calf Region Moderate depletion  Edema (RD Assessment) None  Hair Reviewed  Eyes Reviewed  Mouth Reviewed  Skin Reviewed  Nails Reviewed       Diet Order:   Diet Order             Diet regular Room service appropriate? Yes with Assist; Fluid consistency: Thin  Diet effective now                   EDUCATION NEEDS:   Not appropriate for education at this time  Skin:  Skin Assessment: Reviewed RN Assessment  Last BM:  11/17  Height:   Ht Readings from Last 1 Encounters:  01/12/21 '5\' 8"'  (1.727  m)    Weight:   Wt Readings from Last 1 Encounters:  01/13/21 76.1 kg    BMI:  Body mass index is 25.51 kg/m.  Estimated Nutritional Needs:   Kcal:  2000-2200  Protein:  100-115 gm  Fluid:  2-2.2 L    Lucas Mallow, RD, LDN, CNSC Please refer to Amion for contact information.

## 2021-01-19 NOTE — Progress Notes (Signed)
NAME:  Brett Bean, MRN:  [id number], DOB:  [birth year], LOS: 6 ADMISSION DATE:  01/12/2021, CONSULTATION DATE:  01/13/2021 REFERRING MD:  Dr. Hal Hope, [facility], CHIEF COMPLAINT:  Chest pain   History of Present Illness:  75 yo male with remote history of smoking was in hospital from 12/14/20 to 12/18/20.  He presented with chest pain and fever 101F associated with productive cough with episodes of hemoptysis.  Found to have Rt sided infiltrate and pleural effusion with loculated components.  He was started on antibiotics and had assessment by thoracic surgery.  Repeat CT chest showed free flowing fluid and chest tube placement was deferred.  He was discharged home on augmentin, and took his last dose on 01/12/21.  He initially felt improvement in his symptoms after hospital discharge, but then started developing intermittent fever again up to 102F.  This has been associated with sweats.  He has lost about 25 lbs over the past 1 month.  He is coughing more sputum, and has yellow-green appearance with intermittent episodes of hemoptysis.  He has recurrence of right sided pleuritic type chest pain.  This prompted his return to the ER.  He quit smoking 40 yrs ago.  No prior history of pneumonia or exposure to tuberculosis.  He is from [place].  Was never in the [employer]  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  He denies symptoms of aspiration or reflux, or dental issues (he has upper dentures).  Pertinent  Medical History  HLD  Studies:  CT angio chest 12/15/20 >> moderate ATX/infiltrate in RUL/RML/RLL, mod to large Rt pleural effusion with loculated components, elevated of Rt hemidiaphragm, small hiatal hernia CT chest 12/16/20 >> small Rt pleural effusion is free flowing CT chest 01/13/21 >> 10.2 x 6.6 x 6.5 cm bilobed mass along inferior RML and RLL (was 10.6 x 8.2 x 6.2 cm), new patchy nodular opacity medial RUL, new 10 mm anterior LUL nodule, new 2.7 cm nodule in medial LUL, small Rt  effusion, new 15 mm lesion along anterior Lt upper kidney 11/14 RLL Lung Biopsy - acute and organizing pneumonia  Interim History / Subjective:   Patient had night sweats overnight. Otherwise no issues.   PT is working with patient this morning. Wife is at the bedside.  Objective   Blood pressure 117/72, pulse 64, temperature (!) 97.4 F (36.3 C), temperature source Oral, resp. rate 20, height 5\' 8"  (1.727 m), weight 76.1 kg, SpO2 100 %.        Intake/Output Summary (Last 24 hours) at 01/19/2021 0739 Last data filed at 01/18/2021 1928 Gross per 24 hour  Intake 1401.67 ml  Output --  Net 1401.67 ml   Filed Weights   01/12/21 1559 01/13/21 1649  Weight: 80 kg 76.1 kg   Examination: Gen:      Elderly male, no acute distress, sitting up in chair HEENT:  [place]/AT, sclera anicteric, moist mucous membranes Lungs:    diminished breath sounds right base, no wheezing or rhonchi CV:         Regular rate and rhythm; no murmurs Abd:      Soft, non-tender, non-distended, bowel sounds active Ext:    No edema, warm Skin:      Warm and dry; no rash Neuro: alert and oriented x 3 Psych: normal mood and affect   Discussion:  He has persistent cough with sputum, intermittent hemoptysis, fever up to 102F, intermittent sweats, right pleuritic chest pain, and weight loss.  This is associated with  leukocytosis and elevated inflammatory markers.  Pathology from 11/14 right lower lobe mass biopsy shows acute and organizing pneumonia.  Assessment & Plan:   Acute Organizing Pneumonia Small Right Apical Pneumothorax, post-procedural - Solumedrol 1mg /kg started 11/16. Continue solumedrol 40mg  IV BID. Will transition to oral prednisone 60mg  daily with bactrim DS 3 times per week for pneumocystis prophylaxis when ready for discharge to complete prolonged taper over the next 6 months. He will have frequent follow up visit in pulmonary clinic. - He completed 7 days of cefepime and 5 days of azithromycin.  -  Quantiferon test is pending - HIV is negative, fungitell is undetectable.  15 mm lesion anterior Lt upper kidney. MRI abdomen 11/14, more concerning for cyst  Bony Lesions on MRI abdomen - recommend Oncology consult for further evaluation/workup.  Signature:   Freda Jackson, MD [facility] Office: [phone]   See Amion for personal pager PCCM on call pager [phone] until 7pm. Please call Elink 7p-7a. [phone]

## 2021-01-19 NOTE — Progress Notes (Addendum)
PROGRESS NOTE    Brett Bean  BHA:[id number] DOB: 1945/03/12 DOA: 01/12/2021 PCP: Iona Beard, MD    Brief Narrative:  Mr. Gwinner was admitted to the hospital with the working diagnosis of pneumonia in the setting of right middle and lower lobe mass, sp lung biopsy acute organizing pneumonia.    75 year old male past medical history for dyslipidemia and elevated PSA who presented with fevers and chills.  He had a recent hospitalization about a month ago for pneumonia, treated with Augmentin.  At home he continued to have fevers, chills and productive cough, occasional hemoptysis.  +20 pound weight loss in 4 weeks.  Because of persistent symptoms he was brought back to the hospital.  On his initial physical examination his blood pressure was 106/63, heart rate 101, respiratory rate 33, temperature 99.7, oxygen saturation 94%, his lungs had no wheezing or rhonchi, heart S1-S2 present, rhythmic, soft abdomen, no lower extremity edema.   Sodium 133, potassium 4.3, chloride 99, bicarb 25, glucose 107, BUN 9, creatinine 0.94, white count 25, hemoglobin 10.5, hematocrit 33.8, platelets 474 SARS COVID-19 negative.   Chest radiograph with right middle lobe/lower lobe opacity.   CT chest with 10.2 cm masslike opacity along the inferior right middle lobe and lower lobe.  New multifocal patchy/nodular opacities in the lungs bilaterally.  Mediastinal lymphadenopathy. 15 mm lesion in anterior left upper kidney.   EKG 104 bpm, normal axis, normal intervals, sinus rhythm with PACs, no significant ST segment or T wave changes.   Patient was placed on antibiotic therapy. Further work-up with abdomen MRI showed left renal cyst, multiple contrast enhancing osseous lesions throughout including spine and pelvis over the T10-T11/L2 and L4 vertebral bodies.  Highly suspicion for osseous metastatic disease.   Patient underwent IR CT-guided lung biopsy, postprocedure developed small pneumothorax.   Pathology  positive for organizing pneumonia. Negative for malignancy.   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  (Infectious pneumonia has been ruled out) Patient with improvement in dyspnea, his oxygenation is 97% on 2 L/min per [place] of supplemental 02.  Wbc is down to 25,4 and he has been afebrile.  Antibiotic therapy has been discontinued and patient has been placed on high dose systemic corticosteroids.   Plan to continue long term steroids as outpatient for about 6 months taper with close follow up as outpatient. Home 02 has been arranged and plan is to discharge home in am, if patient continue to improve.    2. Anemia of chronic disease. Stable hgb and hct.    3. Moderate calorie protein malnutrition.  On Nutritional supplementation No outpatient PT or OT follow up recommended.   4. Positive osseous lesions thought the spine and pelvis, history of elevated PSA.  Will complete inpatient work up with follow PSA and pelvic CT, if negative may need further outpatient work up with oncology, possible pet scan.   I reviewed the electronic records and not able to find any information in regards of elevated PSA treatment or workup. Per patient his levels normalized with natural remedies.   Status is: Inpatient  Remains inpatient appropriate because: respiratory monitoring   DVT prophylaxis: Enoxaparin   Code Status:    full  Family Communication:   I spoke with patient's  at  the bedside, we talked in detail about patient's condition, plan of care and prognosis and all questions were addressed.      Nutrition Status: Nutrition Problem: Moderate Malnutrition Etiology: acute illness (CAP) Signs/Symptoms: mild muscle depletion, mild  fat depletion Interventions: Hormel Shake, MVI    Consultants:  Pulmonary  IR   Procedures:   Pulmonary biopsy    Subjective: Patient with no nausea or vomiting, dyspnea continue to improve, no abdominal pain.   Objective: Vitals:   01/18/21 2304 01/19/21 0337 01/19/21 0735 01/19/21 1131  BP: 109/67 117/72  109/70  Pulse: 86 64    Resp: 18  20 20   Temp: (!) 97.5 F (36.4 C) (!) 97.4 F (36.3 C) (!) 97.4 F (36.3 C) 97.6 F (36.4 C)  TempSrc: Oral Oral Oral Oral  SpO2: 97% 100%    Weight:      Height:        Intake/Output Summary (Last 24 hours) at 01/19/2021 1556 Last data filed at 01/19/2021 0900 Gross per 24 hour  Intake 1761.67 ml  Output --  Net 1761.67 ml   Filed Weights   01/12/21 1559 01/13/21 1649  Weight: 80 kg 76.1 kg    Examination:   General: Not in pain or dyspnea, deconditioned  Neurology: Awake and alert, non focal  E ENT: positive pallor, no icterus, oral mucosa moist Cardiovascular: No JVD. S1-S2 present, rhythmic, no gallops, rubs, or murmurs. No lower extremity edema. Pulmonary: positive breath sounds bilaterally, with no wheezing, rhonchi or rales. Gastrointestinal. Abdomen soft and non tender Skin. No rashes Musculoskeletal: no joint deformities     Data Reviewed: I have personally reviewed following labs and imaging studies  CBC: Recent Labs  Lab 01/12/21 1603 01/14/21 0252 01/15/21 0311 01/16/21 0157 01/17/21 0153 01/19/21 0826  WBC 25.0* 26.1* 26.4* 25.9* 26.8* 25.4*  NEUTROABS 19.1*  --  20.7* 20.6*  --  24.1*  HGB 10.5* 9.1* 8.1* 8.1* 8.2* 9.3*  HCT 33.8* 29.6* 25.1* 25.8* 25.7* 30.6*  MCV 93.1 91.4 89.3 89.6 89.2 90.3  PLT 474* 467* 434* 459* 417* 094*   Basic Metabolic Panel: Recent Labs  Lab 01/12/21 1603 01/14/21 0252 01/15/21 0311 01/16/21 0157 01/19/21 0826  NA 133* 130* 128* 131* 135  K 4.3 3.7 4.0 3.8 4.2  CL 99 98 98 101 101  CO2 25 23 22 22 26   GLUCOSE 107* 114* 104* 106* 156*  BUN 9 8 8 9 13    CREATININE 0.94 0.81 0.85 0.82 0.84  CALCIUM 9.1 8.6* 8.1* 8.4* 9.3  MG  --  1.9  --   --   --   PHOS  --  2.8  --   --   --    GFR: Estimated Creatinine Clearance: 71.3 mL/min (by C-G formula based on SCr of 0.84 mg/dL). Liver Function Tests: Recent Labs  Lab 01/19/21 0826  AST 33  ALT 61*  ALKPHOS 124  BILITOT 0.7  PROT 7.6  ALBUMIN 2.2*   No results for input(s): LIPASE, AMYLASE in the last 168 hours. No results for input(s): AMMONIA in the last 168 hours. Coagulation Profile: Recent Labs  Lab 01/17/21 0153  INR 1.5*   Cardiac Enzymes: No results for input(s): CKTOTAL, CKMB, CKMBINDEX, TROPONINI in the last 168 hours. BNP (last 3 results) No results for input(s): PROBNP in the last 8760 hours. HbA1C: No results for input(s): HGBA1C in the last 72 hours. CBG: No results for input(s): GLUCAP in the last 168 hours. Lipid Profile: No results for input(s):  CHOL, HDL, LDLCALC, TRIG, CHOLHDL, LDLDIRECT in the last 72 hours. Thyroid Function Tests: No results for input(s): TSH, T4TOTAL, FREET4, T3FREE, THYROIDAB in the last 72 hours. Anemia Panel: No results for input(s): VITAMINB12, FOLATE, FERRITIN, TIBC, IRON, RETICCTPCT in the last 72 hours.    Radiology Studies: I have reviewed all of the imaging during this hospital visit personally     Scheduled Meds:  methylPREDNISolone (SOLU-MEDROL) injection  40 mg Intravenous Q12H   multivitamin with minerals  1 tablet Oral Daily   Continuous Infusions:   LOS: 6 days        Meliah Appleman Gerome Apley, MD

## 2021-01-20 ENCOUNTER — Telehealth: Payer: Self-pay | Admitting: Pulmonary Disease

## 2021-01-20 DIAGNOSIS — J84116 Cryptogenic organizing pneumonia: Secondary | ICD-10-CM | POA: Diagnosis not present

## 2021-01-20 LAB — FUNGITELL, SERUM: Fungitell Result: <31 pg/mL (ref <80)

## 2021-01-20 MED ORDER — PREDNISONE 20 MG PO TABS
60.0000 mg | ORAL_TABLET | Freq: Every day | ORAL | Status: DC
  Administered 2021-01-20: 60 mg via ORAL
  Filled 2021-01-20: qty 3

## 2021-01-20 MED ORDER — PREDNISONE 20 MG PO TABS: 60.0000 mg | ORAL_TABLET | Freq: Every day | ORAL | 0 refills | Status: AC

## 2021-01-20 MED ORDER — SULFAMETHOXAZOLE-TRIMETHOPRIM 800-160 MG PO TABS
1.0000 | ORAL_TABLET | ORAL | Status: DC
Start: 2021-01-20 — End: 2021-01-20
  Administered 2021-01-20: 1 via ORAL
  Filled 2021-01-20: qty 1

## 2021-01-20 MED ORDER — SULFAMETHOXAZOLE-TRIMETHOPRIM 800-160 MG PO TABS: 1.0000 | ORAL_TABLET | ORAL | 0 refills | Status: DC

## 2021-01-20 NOTE — Progress Notes (Signed)
NAME:  Brett Bean, MRN:  [id number], DOB:  04/01/1945, LOS: 7 ADMISSION DATE:  01/12/2021, CONSULTATION DATE:  01/13/2021 REFERRING MD:  Dr. Hal Hope, [facility], CHIEF COMPLAINT:  Chest pain   History of Present Illness:  75 yo male with remote history of smoking was in hospital from 12/14/20 to 12/18/20.  He presented with chest pain and fever 101F associated with productive cough with episodes of hemoptysis.  Found to have Rt sided infiltrate and pleural effusion with loculated components.  He was started on antibiotics and had assessment by thoracic surgery.  Repeat CT chest showed free flowing fluid and chest tube placement was deferred.  He was discharged home on augmentin, and took his last dose on 01/12/21.  He initially felt improvement in his symptoms after hospital discharge, but then started developing intermittent fever again up to 102F.  This has been associated with sweats.  He has lost about 25 lbs over the past 1 month.  He is coughing more sputum, and has yellow-green appearance with intermittent episodes of hemoptysis.  He has recurrence of right sided pleuritic type chest pain.  This prompted his return to the ER.  He quit smoking 40 yrs ago.  No prior history of pneumonia or exposure to tuberculosis.  He is from [place].  Was never in the [employer]  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  He denies symptoms of aspiration or reflux, or dental issues (he has upper dentures).  Pertinent  Medical History  HLD  Studies:  CT angio chest 12/15/20 >> moderate ATX/infiltrate in RUL/RML/RLL, mod to large Rt pleural effusion with loculated components, elevated of Rt hemidiaphragm, small hiatal hernia CT chest 12/16/20 >> small Rt pleural effusion is free flowing CT chest 01/13/21 >> 10.2 x 6.6 x 6.5 cm bilobed mass along inferior RML and RLL (was 10.6 x 8.2 x 6.2 cm), new patchy nodular opacity medial RUL, new 10 mm anterior LUL nodule, new 2.7 cm nodule in medial LUL, small Rt  effusion, new 15 mm lesion along anterior Lt upper kidney 11/14 RLL Lung Biopsy - acute and organizing pneumonia  Interim History / Subjective:   Patient had night sweats overnight. Otherwise no issues.   PT is working with patient this morning. Wife is at the bedside.  Objective   Blood pressure 123/85, pulse 78, temperature (!) 97.5 F (36.4 C), temperature source Oral, resp. rate 20, height 5\' 8"  (1.727 m), weight 76.1 kg, SpO2 99 %.       No intake or output data in the 24 hours ending 01/20/21 0948  Filed Weights   01/12/21 1559 01/13/21 1649  Weight: 80 kg 76.1 kg   Examination: Gen:    Sitting up in bed on room air, eating breakfast HEENT:  mmm Lungs:    ctab no wheees or crackles, no increased work of breathing CV:         RRR no mrg Abd:      Soft, non-tender, non-distended, bowel sounds active Ext:    No edema, warm  Assessment & Plan:   Acute Organizing Pneumonia Small Right Apical Pneumothorax, post-procedural - transitioned to prednisone 60 mg today. This is his discharge dose. Needs to be discharged with bactrim DS MWF for PCP ppx at discharge. He will be going home with nocturnal oxygen.  - He completed 7 days of cefepime and 5 days of azithromycin.  - Quantiferon test is still in process at discharge.  - HIV is negative, fungitell is undetectable.  15  mm lesion anterior Lt upper kidney. MRI abdomen 11/14, more concerning for cyst  Bony Lesions on MRI abdomen - recommend Oncology consult for further evaluation/workup.  Appointment with our office has been made. No objection to discharge from pulmonary perspective.    Signature:   Lenice Llamas, MD Pulmonary and [place]

## 2021-01-20 NOTE — TOC Transition Note (Signed)
Transition of Care (TOC) - CM/SW Discharge Note Marvetta Gibbons RN, BSN Transitions of Care Unit 4E- RN Case Manager See Treatment Team for direct phone #    Patient Details  Name: Brett Bean MRN: [id number] Date of Birth: 05/31/1945  [facility] CM/SW Contact:  Dawayne Patricia, RN Phone Number: 01/20/2021, 12:10 PM   Clinical Narrative:    Pt stable for transition home, home -02 has been arranged with [employer] and transport equipment delivered to room. No further TOC needs noted for discharge.  Pt to return home w/ wife.    Final next level of care: Home/Self Care Barriers to Discharge: Barriers Resolved   Patient Goals and CMS Choice        Discharge Placement               HOme        Discharge Plan and Services   Discharge Planning Services: CM Consult Post Acute Care Choice: Durable Medical Equipment          DME Arranged: Oxygen DME Agency: [employer] Date DME Agency Contacted: 01/19/21 Time DME Agency Contacted: [phone] Representative spoke with at DME Agency: [place] ([place]) Interventions     Readmission Risk Interventions No flowsheet data found.

## 2021-01-20 NOTE — Telephone Encounter (Signed)
Hi [place],  Please schedule this patient for hospital follow up with me on 11/30 for organizing pneumonia. He has an appointment on 12/1 with Dr. Elsworth Soho that can be cancelled (permission given by Dr. Elsworth Soho).   Thanks, Wille Glaser

## 2021-01-20 NOTE — Plan of Care (Signed)
  Problem: Health Behavior/Discharge Planning: Goal: Ability to manage health-related needs will improve Outcome: Progressing   Problem: Clinical Measurements: Goal: Will remain free from infection Outcome: Progressing Goal: Diagnostic test results will improve Outcome: Progressing   

## 2021-01-20 NOTE — Telephone Encounter (Signed)
error 

## 2021-01-20 NOTE — Telephone Encounter (Signed)
Message came through blank. Patient is currently inpatient.  Will message MD to see if this patient needed an appt.   JD please advise message came through blank does patient need a Hospital f/u appt? Thanks

## 2021-01-20 NOTE — Telephone Encounter (Signed)
Hospital F/U appt made.   Nothing further needed at this time.

## 2021-01-20 NOTE — Discharge Summary (Signed)
Physician Discharge Summary  Brett Bean UXL:[id number] DOB: 1946-01-17 DOA: 01/12/2021  PCP: Iona Beard, MD  Admit date: 01/12/2021 Discharge date: 01/20/2021  Admitted From: Home  Disposition:  Home   Recommendations for Outpatient Follow-up and new medication changes:  Follow up with Dr. Berdine Addison in 7 to 10 days.  Continue prednisone 60 mg daily with slow taper over the next 6 months. Prophylactic bactrim 3 times per week. Follow up with Oncology for elevated PSA and spine lesions.   Home Health: no   Equipment/Devices: home 02  Discharge Condition: stable  CODE STATUS: full  Diet recommendation:  heart healthy   Brief/Interim Summary: Mr. Brett Bean was admitted to the hospital with the working diagnosis of pneumonia in the setting of right middle and right lower lobe mass, sp lung biopsy positive for acute organizing pneumonia.    75 year old male past medical history for dyslipidemia and elevated PSA who presented with fevers and chills.  He had a recent hospitalization about a month ago for pneumonia, treated with Augmentin.  At home he continued to have fevers, chills and productive cough, occasional hemoptysis.  +20 pound weight loss in 4 weeks.  Because of persistent symptoms he was brought back to the hospital.  On his initial physical examination his blood pressure was 106/63, heart rate 101, respiratory rate 33, temperature 99.7, oxygen saturation 94%, his lungs had no wheezing or rhonchi, heart S1-S2 present, rhythmic, soft abdomen, no lower extremity edema.   Sodium 133, potassium 4.3, chloride 99, bicarb 25, glucose 107, BUN 9, creatinine 0.94, white count 25, hemoglobin 10.5, hematocrit 33.8, platelets 474 SARS COVID-19 negative.   Chest radiograph with right middle lobe/lower lobe opacity.   CT chest with 10.2 cm masslike opacity along th right middle lobe and right lower lobe.  New multifocal patchy/nodular opacities in the lungs bilaterally.  Mediastinal  lymphadenopathy. 15 mm lesion in anterior left upper kidney.   EKG 104 bpm, normal axis, normal intervals, sinus rhythm with PACs, no significant ST segment or T wave changes.   Patient was placed on antibiotic therapy. Further work-up with abdomen MRI showed left renal cyst, multiple contrast enhancing osseous lesions throughout including spine and pelvis over the T10-T11/L2 and L4 vertebral bodies.  Highly suspicion for osseous metastatic disease.   Patient underwent IR CT-guided lung biopsy, complicated with a small postprocedure pneumothorax.   Pathology positive for organizing pneumonia. Negative for malignancy.   Placed on high dose steroids, with planned slow taper over the next 6 months. Pelvic CT with enlarge prostate, but no signs of malignancy, PSA elevated.   Will need outpatient follow up.   Right middle lobe and right lower lobe acute organizing pneumonia, status post IR guided biopsy, complicated with post procedures pneumothorax. (Infectious pneumonia has been ruled out.) Patient was admitted to the medical ward, he was placed on supplemental oxygen and initially antibiotic therapy. His cultures remain no growth. Underwen patient was placed on nutritional supplementation.  He was evaluated by physical therapy and Occupational Therapy.  T further work-up with CT-guided biopsy of the right lower lobe mass/consolidation. Postprocedure he developed a small pneumothorax.  Pathology was positive for acute and organizing pneumonia, negative for malignancy. His antibiotic therapy was discontinued and patient was placed on high-dose corticosteroids. Patient will continue 60 mg daily with a slow taper over the next 6 months, prophylactic therapy with Bactrim 3 times weekly. Follow-up with pulmonary as an outpatient.  His QuantiFERON TB Gold plus test is pending.  At his discharge his  oxygenation is 99% on room air.  2.  Anemia of chronic disease.  His hemoglobin hematocrit  remained stable.  3.  Moderate calorie protein malnutrition.  Patient was placed on nutritional supplements, he was evaluated by physical therapy and Occupational Therapy.  4.  Positive osseous lesions through the spine and pelvis, history of elevated PSA. Incidental finding of multiple contrast-enhancing osseous lesions throughout the spine and pelvis particularly at the T T10, T11-L2 and L4 vertebral bodies.  Suspicious for malignancy.  Further work-up with pelvic CT showed enlarged prostate with bladder base impression calcifications posteriorly.  Bladder thickening.  Prominent inguinal chain lymph nodes but not intrapelvic adenopathy.  PSA 6.38.  Will refer for further work-up as an outpatient.  Discharge Diagnoses:  Principal Problem:   Cryptogenic organizing pneumonia [facility]) Active Problems:   Normochromic normocytic anemia   Malnutrition of moderate degree   Lung mass    Discharge Instructions   Allergies as of 01/20/2021       Reactions   Asa [aspirin] Nausea And Vomiting        Medication List     STOP taking these medications    amoxicillin-clavulanate 875-125 MG tablet Commonly known as: Augmentin       TAKE these medications    acetaminophen 500 MG tablet Commonly known as: TYLENOL Take 1,000 mg by mouth every 6 (six) hours as needed for moderate pain or fever.   cholecalciferol 25 MCG (1000 UNIT) tablet Commonly known as: VITAMIN D Take 1,000 Units by mouth daily.   EYE VITAMINS PO Take 1 tablet by mouth daily. supplement   predniSONE 20 MG tablet Commonly known as: DELTASONE Take 3 tablets (60 mg total) by mouth daily with breakfast. Start taking on: January 21, 2021   Probiotic Acidophilus Caps Take 1 capsule by mouth daily.   Prostate Caps Take 1 capsule by mouth in the morning and at bedtime. supplement   sulfamethoxazole-trimethoprim 800-160 MG tablet Commonly known as: BACTRIM DS Take 1 tablet by mouth 3 (three) times a week.                Durable Medical Equipment  (From admission, onward)           Start     Ordered   01/18/21 1538  For home use only DME oxygen  Once       Question Answer Comment  Length of Need 6 Months   Mode or (Route) Nasal cannula   Liters per Minute 2   Frequency Continuous (stationary and portable oxygen unit needed)   Oxygen conserving device Yes   Oxygen delivery system Gas      01/18/21 1537              Discharge Care Instructions  (From admission, onward)           Start     Ordered   01/20/21 0000  Discharge wound care:       Comments: Please follow up with primary care in 7 to 10 days. Follow with pulmonary as scheduled.   01/20/21 0905            Follow-up Information     [facility] Follow up.   Why: home 02 arranged- will deliver transport equipment to the room prior to discharge. Contact information: [place] [address] [phone]         Freddi Starr, MD Follow up.   Specialty: Pulmonary Disease Contact information: [address]  [address] [place] [address] [phone]                Allergies  Allergen Reactions   Asa [Aspirin] Nausea And Vomiting    Consultations: Pulmonary    Procedures/Studies: DG Chest 2 View  Result Date: 01/16/2021 CLINICAL DATA:  Persistent pneumonia EXAM: CHEST - 2 VIEW COMPARISON:  Chest radiograph 01/12/2021, CT chest 01/13/2021 FINDINGS: The cardiomediastinal silhouette is grossly stable. Lung volumes are low. There is persistent confluent opacity projecting over the right lower lobe with silhouetting of the lateral portion of the right hemidiaphragm. The density appears increased compared to the study from 01/12/2021. The right upper lung remains well aerated. The left lung is clear. There is a possible small right pleural effusion. There is no significant left effusion. There is no pneumothorax. The bones are stable. IMPRESSION:  1. Persistent confluent opacity projecting over the right lower lobe. Differential is unchanged, including infection or neoplasm. 2. Small right pleural effusion, not significantly changed Electronically Signed   By: Valetta Mole M.D.   On: 01/16/2021 08:40   DG Chest 2 View  Result Date: 01/12/2021 CLINICAL DATA:  Shortness of breath EXAM: CHEST - 2 VIEW COMPARISON:  Radiograph 01/03/2021 FINDINGS: Unchanged cardiomediastinal silhouette. Persistent right basilar opacities and small right pleural effusion. Left lung is clear. No visible pneumothorax. No acute osseous abnormalities. IMPRESSION: Persistent right basilar consolidation and small right pleural effusion. Electronically Signed   By: Maurine Simmering M.D.   On: 01/12/2021 16:33   DG Chest 2 View  Result Date: 01/03/2021 CLINICAL DATA:  Productive cough.  Pneumonia follow-up. EXAM: CHEST - 2 VIEW COMPARISON:  Chest x-ray dated December 18, 2020. FINDINGS: The heart size and mediastinal contours are within normal limits. Slightly decreased small right pleural effusion. Continued right lower lobe opacity and right hemidiaphragm elevation. The left lung is clear. No pneumothorax. No acute osseous abnormality. IMPRESSION: 1. Slightly decreased small right pleural effusion. 2. Continued right lower lobe atelectasis versus infiltrate. Electronically Signed   By: Titus Dubin M.D.   On: 01/03/2021 11:00   CT Chest Wo Contrast  Result Date: 01/13/2021 CLINICAL DATA:  Pneumonia, unresolved EXAM: CT CHEST WITHOUT CONTRAST TECHNIQUE: Multidetector CT imaging of the chest was performed following the standard protocol without IV contrast. COMPARISON:  Chest radiographs dated 01/12/2021. CTA chest dated 12/14/2020. FINDINGS: Cardiovascular: The heart is normal in size. No pericardial effusion. No evidence of thoracic aortic aneurysm. Mediastinum/Nodes: Small mediastinal lymph nodes, including a dominant 18 mm short axis subcarinal node (series 3/image 86).  Visualized thyroid is unremarkable. Lungs/Pleura: 10.2 x 6.6 x 6.5 cm bilobed mass along the inferior right middle and lower lobes (series 3/image 96), previously 10.6 x 8.2 x 6.2 cm. Given the slight decrease in measuring size, this could still reflect severe infection/pneumonia, but primary bronchogenic neoplasm is certainly not excluded. New patchy/nodular opacity in the medial right upper lobe (series 4/image 59). New 10 mm nodule in the anterior left upper lobe (series 4/image 77). New 2.7 cm nodule in the medial left upper lobe/perihilar region (series 4/image 90). Differential consideration includes multifocal infection versus metastatic disease. Associated small right pleural effusion, unchanged. No pneumothorax. Upper Abdomen: New 15 mm lesion along the anterior left upper kidney (series 3/image 172), not clearly evident on recent CT. 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 Suspected vertebral hemangioma along the anterior aspect of T11. IMPRESSION: 10.2 cm bilobed masslike opacity along the inferior right middle and lower lobes, possibly mildly improved from the prior. While this  may reflect severe infection/pneumonia, given persistence, primary bronchogenic neoplasm is not excluded. Bronchoscopy is suggested. New multifocal patchy/nodular opacities in the lungs bilaterally, possibly reflecting multifocal infection versus metastatic disease. Associated mediastinal lymphadenopathy, including an 18 mm short axis subcarinal node. Small right pleural effusion, unchanged. New 15 mm lesion in the anterior left upper kidney, poorly evaluated on unenhanced CT. If this patient has a documented pulmonary malignancy, this can be evaluated at the time of staging of the abdomen/pelvis. Electronically Signed   By: Julian Hy M.D.   On: 01/13/2021 02:46   CT PELVIS W CONTRAST  Result Date: 01/19/2021 CLINICAL DATA:  Prostate cancer, surveillance. EXAM: CT PELVIS WITH CONTRAST TECHNIQUE: Multidetector CT  imaging of the pelvis was performed using the standard protocol following the bolus administration of intravenous contrast. CONTRAST:  81mL OMNIPAQUE IOHEXOL 300 MG/ML  SOLN COMPARISON:  No prior similar exam. Limited comparison is available with MRI abdomen without and with contrast 01/16/2021 showing indeterminate lesions of strong concern for metastasis in the T10, T11, L2, and L4 vertebrae. This study did not include the prostate. FINDINGS: Urinary Tract: The prostate is enlarged. This borders are not optimally delineated due to motion artifact. It is estimated to measure 6.0 cm transverse, 5.6 cm AP and 5.0 cm craniocaudal. There are calcifications in the prostate posteriorly on both sides. There is moderate impression on the posterior bladder and mild thickening of the bladder wall in general without masslike thickening. The distal ureters are clear. A small cyst is partially visible in the inferior pole of the right kidney. There is no intravesical stone. Are unremarkable. Bowel: Sigmoid diverticulosis without evidence of diverticulitis. Normal appendix. Otherwise unremarkable pelvic large and small bowel unopacified loops. Vascular/Lymphatic: No distal abdominal aortic aneurysm is seen. There is mild tortuosity and ectasia of the iliac arteries without stenosis or dissection. No enlarged pelvic lymph nodes are seen. There are slightly prominent bilateral inguinal chain nodes, up to 1.1 cm on the right and up to 1.0 cm on the left, nonspecific. There are pelvic phleboliths on both sides. The seminal vesicles Reproductive:  As above. Other: Small umbilical fat hernia. There is no free air, fluid or hemorrhage. 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 No grossly destructive bone lesion is visible. There is mild hip DJD, enthesopathic change to the pelvic wings, mild spurring at the SI joints, pubic symphysis. 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 IMPRESSION: 1. Enlarged prostate with bladder base impression and calcifications posteriorly. 2. Bladder thickening which could be due to hypertrophy or cystitis. 3. Slightly prominent inguinal chain lymph nodes but no intrapelvic adenopathy. 4. Diverticulosis without evidence of diverticulitis. 5. Additional findings as above. Electronically Signed   By: Telford Nab M.D.   On: 01/19/2021 22:56   MR ABDOMEN W WO CONTRAST  Result Date: 01/16/2021 CLINICAL DATA:  Evaluate incidentally identified superior pole left renal lesion EXAM: MRI ABDOMEN WITHOUT AND WITH CONTRAST TECHNIQUE: Multiplanar multisequence MR imaging of the abdomen was performed both before and after the administration of intravenous contrast. CONTRAST:  7.68mL GADAVIST GADOBUTROL 1 MMOL/ML IV SOLN COMPARISON:  CT chest, 01/13/2021 FINDINGS: Examination is very limited by patient arm positioning, field inhomogeneity, and breath motion artifact throughout. Lower chest: Large mass in the lower right chest, partially imaged and better evaluated by recent prior CT. Hepatobiliary: No mass or other parenchymal abnormality identified. Status post cholecystectomy. No biliary ductal dilatation. Pancreas: No  mass, inflammatory changes, or other parenchymal abnormality identified. No pancreatic ductal dilatation. Spleen:  Within normal limits in size and appearance. Adrenals/Urinary Tract: No masses identified. A lesion of the anterior superior pole of the left kidney identified by prior CT is of T1 and T2 intermediate signal, without evidence of associated contrast enhancement, measuring approximately 1.6 x 1.5 cm (series 1004, image 69). Additional nonenhancing cysts bilaterally, some of which may be thinly septated. No evidence of hydronephrosis. Stomach/Bowel: Visualized portions within the abdomen are unremarkable. Vascular/Lymphatic: No pathologically enlarged lymph nodes  identified. No abdominal aortic aneurysm demonstrated. Other:  Anasarca. Musculoskeletal: Multiple contrast enhancing osseous lesions throughout the included spine and pelvis, particularly of the T10, T11-L2, and L4 vertebral bodies. IMPRESSION: 1. Examination is technically very limited as detailed above. 2. Within this limitation, a lesion of the anterior superior pole of the left kidney identified by prior CT of the chest is of T1 and T2 intermediate signal, without evidence of associated contrast enhancement, measuring approximately 1.6 x 1.5 cm. This is most consistent with a hemorrhagic or proteinaceous cyst. 3. Multiple contrast enhancing osseous lesions throughout the included spine and pelvis, particularly of the T10, T11-L2, and L4 vertebral bodies. These do not clearly correspond to benign, trabeculated hemangiomata on prior CT and are highly suspicious for osseous metastatic disease. 4. Large mass in the lower right chest, partially imaged and better evaluated by recent CT. Electronically Signed   By: Delanna Ahmadi M.D.   On: 01/16/2021 10:09   DG CHEST PORT 1 VIEW  Result Date: 01/17/2021 CLINICAL DATA:  Pneumothorax status post biopsy. EXAM: PORTABLE CHEST 1 VIEW COMPARISON:  January 17, 2021 (12:14 p.m.) FINDINGS: A stable tiny right apical pneumothorax is seen. Stable pulmonary vascular congestion is seen with mild, stable areas of atelectasis noted within the bilateral lung bases. There is a small, stable right pleural effusion. The heart size and mediastinal contours are within normal limits. The visualized skeletal structures are unremarkable. IMPRESSION: 1. Stable exam with a stable tiny right apical pneumothorax. Electronically Signed   By: Virgina Norfolk M.D.   On: 01/17/2021 15:36   DG Chest Port 1 View  Result Date: 01/17/2021 CLINICAL DATA:  Minimal pneumothorax after biopsy EXAM: PORTABLE CHEST 1 VIEW COMPARISON:  Previous studies including the CT biopsy done earlier today  FINDINGS: There is tiny right apical pneumothorax. Transverse diameter of heart is increased. Central pulmonary vessels are more prominent. There is interval increase in interstitial markings in the both parahilar regions, more so on the right side. Homogeneous opacity in the right lower lung fields has not changed significantly. Right pleural effusion is present with possible increase in size. IMPRESSION: There is tiny right apical pneumothorax. There is interval worsening of pulmonary vascular congestion. Increased interstitial markings in both lungs, more so on the right side suggests interstitial pulmonary edema, more so on the right side. Homogeneous opacity in the right lower lung fields may suggest atelectasis/pneumonia and right pleural effusion Electronically Signed   By: Elmer Picker M.D.   On: 01/17/2021 13:15   CT LUNG MASS BIOPSY  Result Date: 01/17/2021 INDICATION: 75 year old male with right lower lobe consolidation and bone lesions concerning for metastatic disease. Lung biopsy requested to evaluate for underlying malignancy. EXAM: CT-guided right lung biopsy COMPARISON:  01/13/2021 MEDICATIONS: None. ANESTHESIA/SEDATION: Fentanyl 50 mcg IV; Versed 1 mg IV Sedation time: 10 minutes; The patient was continuously monitored during the procedure by the interventional radiology nurse under my direct supervision. CONTRAST:  None COMPLICATIONS: SIR  LEVEL B - Normal therapy, includes overnight admission for observation. Asymptomatic trace pneumothorax which improved after aspiration via introducer needle upon withdrawal. PROCEDURE: Informed consent was obtained from the patient following an explanation of the procedure, risks, benefits and alternatives. The patient understands,agrees and consents for the procedure. All questions were addressed. A time out was performed prior to the initiation of the procedure. The patient was positioned prone on the CT table and a limited chest CT was performed  for procedural planning demonstrating consolidative opacity in the right lower lobe and small right pleural effusion. The operative site was prepped and draped in the usual sterile fashion. Under sterile conditions and local anesthesia, a 17 gauge coaxial needle was advanced into the peripheral aspect of the consolidation. Positioning was confirmed with intermittent CT fluoroscopy and followed by the acquisition of a total of 3 samples with an 18 gauge core needle biopsy device. Limited CT demonstrated trace pneumothorax. Aspiration was performed via the introducer needle upon removal. Superficial hemostasis was achieved with manual compression. Limited postprocedural chest CT demonstrated even smaller trace right posterior pneumothorax. A dressing was placed. The patient tolerated the procedure well. IMPRESSION: Technically successful CT guided core needle core biopsy of right lower lobe consolidative process versus mass. PLAN: Follow-up 1 hour chest radiograph to assess for resolution of pneumothorax. Ruthann Cancer, MD [facility] Electronically Signed   By: Ruthann Cancer M.D.   On: 01/17/2021 11:23     Procedures: lung biopsy   Subjective: Patient is feeling better, dyspnea continue to improved, no chest pain, no nausea or vomiting  Discharge Exam: Vitals:   01/19/21 2321 01/20/21 0344  BP: 104/79 123/85  Pulse: 81 78  Resp: 18 20  Temp: 98.2 F (36.8 C) 98.5 F (36.9 C)  SpO2: 98% 99%   Vitals:   01/19/21 1714 01/19/21 1932 01/19/21 2321 01/20/21 0344  BP:  135/79 104/79 123/85  Pulse:  (!) 110 81 78  Resp: 20 20 18 20   Temp: (!) 97.5 F (36.4 C) (!) 97.4 F (36.3 C) 98.2 F (36.8 C) 98.5 F (36.9 C)  TempSrc: Oral Oral Oral Oral  SpO2:  97% 98% 99%  Weight:      Height:        General: Not in pain or dyspnea  Neurology: Awake and alert, non focal  E ENT: no pallor, no icterus, oral mucosa moist Cardiovascular: No  JVD. S1-S2 present, rhythmic, no gallops, rubs, or murmurs. No lower extremity edema. Pulmonary: positive breath sounds bilaterally, decreased breath sounds at the right base but no wheezing, rhonchi or rales. Gastrointestinal. Abdomen soft and non tender Skin. No rashes Musculoskeletal: no joint deformities   The results of significant diagnostics from this hospitalization (including imaging, microbiology, ancillary and laboratory) are listed below for reference.     Microbiology: Recent Results (from the past 240 hour(s))  Resp Panel by RT-PCR (Flu A&B, Covid) Nasopharyngeal Swab     Status: None   Collection Time: 01/13/21  1:18 AM   Specimen: Nasopharyngeal Swab; Nasopharyngeal(NP) swabs in vial transport medium  Result Value Ref Range Status   SARS Coronavirus 2 by RT PCR NEGATIVE NEGATIVE Final    Comment: (NOTE) SARS-CoV-2 target nucleic acids are NOT DETECTED.  The SARS-CoV-2 RNA is generally detectable in upper respiratory specimens during the acute phase of infection. The lowest concentration of SARS-CoV-2 viral copies this assay can detect is 138 copies/mL. A negative result does not preclude SARS-Cov-2 infection and should not be  used as the sole basis for treatment or other patient management decisions. A negative result may occur with  improper specimen collection/handling, submission of specimen other than nasopharyngeal swab, presence of viral mutation(s) within the areas targeted by this assay, and inadequate number of viral copies(<138 copies/mL). A negative result must be combined with clinical observations, patient history, and epidemiological information. The expected result is Negative.  Fact Sheet for Patients:  EntrepreneurPulse.com.au  Fact Sheet for Healthcare Providers:  IncredibleEmployment.be  This test is no t yet approved or cleared by the [place] FDA and  has been authorized for detection and/or diagnosis  of SARS-CoV-2 by FDA under an Emergency Use Authorization (EUA). This EUA will remain  in effect (meaning this test can be used) for the duration of the COVID-19 declaration under Section 564(b)(1) of the Act, 21 U.S.C.section 360bbb-3(b)(1), unless the authorization is terminated  or revoked sooner.       Influenza A by PCR NEGATIVE NEGATIVE Final   Influenza B by PCR NEGATIVE NEGATIVE Final    Comment: (NOTE) The Xpert Xpress SARS-CoV-2/FLU/RSV plus assay is intended as an aid in the diagnosis of influenza from Nasopharyngeal swab specimens and should not be used as a sole basis for treatment. Nasal washings and aspirates are unacceptable for Xpert Xpress SARS-CoV-2/FLU/RSV testing.  Fact Sheet for Patients: EntrepreneurPulse.com.au  Fact Sheet for Healthcare Providers: IncredibleEmployment.be  This test is not yet approved or cleared by the [place] FDA and has been authorized for detection and/or diagnosis of SARS-CoV-2 by FDA under an Emergency Use Authorization (EUA). This EUA will remain in effect (meaning this test can be used) for the duration of the COVID-19 declaration under Section 564(b)(1) of the Act, 21 U.S.C. section 360bbb-3(b)(1), unless the authorization is terminated or revoked.  Performed at [facility], [place] [address] [place], [place] [address]   Blood culture (routine x 2)     Status: None   Collection Time: 01/13/21  2:05 AM   Specimen: BLOOD  Result Value Ref Range Status   Specimen Description BLOOD LEFT ANTECUBITAL  Final   Special Requests   Final    BOTTLES DRAWN AEROBIC AND ANAEROBIC Blood Culture adequate volume   Culture   Final    NO GROWTH 5 DAYS Performed at [facility], [place] [address] [place], [place] [address]    Report Status 01/18/2021 FINAL  Final  Blood culture (routine x 2)     Status: None   Collection Time: 01/13/21  3:32 AM   Specimen: BLOOD  Result Value Ref Range  Status   Specimen Description BLOOD SITE NOT SPECIFIED  Final   Special Requests   Final    BOTTLES DRAWN AEROBIC AND ANAEROBIC Blood Culture results may not be optimal due to an excessive volume of blood received in culture bottles   Culture   Final    NO GROWTH 5 DAYS Performed at [facility], [place] [address], [place], [place] [address]    Report Status 01/18/2021 FINAL  Final  Expectorated Sputum Assessment w Gram Stain, Rflx to Resp Cult     Status: None   Collection Time: 01/13/21 10:51 AM   Specimen: Expectorated Sputum  Result Value Ref Range Status   Specimen Description EXPECTORATED SPUTUM  Final   Special Requests NONE  Final   Sputum evaluation   Final    THIS SPECIMEN IS ACCEPTABLE FOR SPUTUM CULTURE Performed at [facility], [place] [address], [place], [place]  [address]    Report Status 01/13/2021 FINAL  Final  MRSA Next Gen by PCR, Nasal     Status: None   Collection Time: 01/13/21 10:51 AM   Specimen: Nasal Mucosa; Nasal Swab  Result Value Ref Range Status   MRSA by PCR Next Gen NOT DETECTED NOT DETECTED Final    Comment: (NOTE) The GeneXpert MRSA Assay (FDA approved for NASAL specimens only), is one component of a comprehensive MRSA colonization surveillance program. It is not intended to diagnose MRSA infection nor to guide or monitor treatment for MRSA infections. Test performance is not FDA approved in patients less than 62 years old. Performed at [facility], [place] [address] [place], [place] [address]   Culture, Respiratory w Gram Stain     Status: None   Collection Time: 01/13/21 10:51 AM  Result Value Ref Range Status   Specimen Description EXPECTORATED SPUTUM  Final   Special Requests NONE Reflexed from [id number]  Final   Gram Stain   Final    RARE WBC PRESENT, PREDOMINANTLY PMN RARE GRAM POSITIVE COCCI IN PAIRS IN CHAINS RARE GRAM VARIABLE ROD    Culture   Final    FEW Normal respiratory flora-no Staph aureus or Pseudomonas  seen Performed at [facility], [place] [address], [place], [place] [address]    Report Status 01/15/2021 FINAL  Final     Labs: BNP (last 3 results) Recent Labs    01/12/21 1603  BNP 528.4*   Basic Metabolic Panel: Recent Labs  Lab 01/14/21 0252 01/15/21 0311 01/16/21 0157 01/19/21 0826  NA 130* 128* 131* 135  K 3.7 4.0 3.8 4.2  CL 98 98 101 101  CO2 23 22 22 26   GLUCOSE 114* 104* 106* 156*  BUN 8 8 9 13   CREATININE 0.81 0.85 0.82 0.84  CALCIUM 8.6* 8.1* 8.4* 9.3  MG 1.9  --   --   --   PHOS 2.8  --   --   --    Liver Function Tests: Recent Labs  Lab 01/19/21 0826  AST 33  ALT 61*  ALKPHOS 124  BILITOT 0.7  PROT 7.6  ALBUMIN 2.2*   No results for input(s): LIPASE, AMYLASE in the last 168 hours. No results for input(s): AMMONIA in the last 168 hours. CBC: Recent Labs  Lab 01/14/21 0252 01/15/21 0311 01/16/21 0157 01/17/21 0153 01/19/21 0826  WBC 26.1* 26.4* 25.9* 26.8* 25.4*  NEUTROABS  --  20.7* 20.6*  --  24.1*  HGB 9.1* 8.1* 8.1* 8.2* 9.3*  HCT 29.6* 25.1* 25.8* 25.7* 30.6*  MCV 91.4 89.3 89.6 89.2 90.3  PLT 467* 434* 459* 417* 579*   Cardiac Enzymes: No results for input(s): CKTOTAL, CKMB, CKMBINDEX, TROPONINI in the last 168 hours. BNP: Invalid input(s): POCBNP CBG: No results for input(s): GLUCAP in the last 168 hours. D-Dimer No results for input(s): DDIMER in the last 72 hours. Hgb A1c No results for input(s): HGBA1C in the last 72 hours. Lipid Profile No results for input(s): CHOL, HDL, LDLCALC, TRIG, CHOLHDL, LDLDIRECT in the last 72 hours. Thyroid function studies No results for input(s): TSH, T4TOTAL, T3FREE, THYROIDAB in the last 72 hours.  Invalid input(s): FREET3 Anemia work up No results for input(s): VITAMINB12, FOLATE, FERRITIN, TIBC, IRON, RETICCTPCT in the last 72 hours. Urinalysis No results found for: COLORURINE, APPEARANCEUR, [place], [place], GLUCOSEU, [place], BILIRUBINUR, KETONESUR, PROTEINUR, UROBILINOGEN,  NITRITE, LEUKOCYTESUR Sepsis Labs Invalid input(s): PROCALCITONIN,  WBC,  LACTICIDVEN Microbiology Recent Results (from the past  240 hour(s))  Resp Panel by RT-PCR (Flu A&B, Covid) Nasopharyngeal Swab     Status: None   Collection Time: 01/13/21  1:18 AM   Specimen: Nasopharyngeal Swab; Nasopharyngeal(NP) swabs in vial transport medium  Result Value Ref Range Status   SARS Coronavirus 2 by RT PCR NEGATIVE NEGATIVE Final    Comment: (NOTE) SARS-CoV-2 target nucleic acids are NOT DETECTED.  The SARS-CoV-2 RNA is generally detectable in upper respiratory specimens during the acute phase of infection. The lowest concentration of SARS-CoV-2 viral copies this assay can detect is 138 copies/mL. A negative result does not preclude SARS-Cov-2 infection and should not be used as the sole basis for treatment or other patient management decisions. A negative result may occur with  improper specimen collection/handling, submission of specimen other than nasopharyngeal swab, presence of viral mutation(s) within the areas targeted by this assay, and inadequate number of viral copies(<138 copies/mL). A negative result must be combined with clinical observations, patient history, and epidemiological information. The expected result is Negative.  Fact Sheet for Patients:  EntrepreneurPulse.com.au  Fact Sheet for Healthcare Providers:  IncredibleEmployment.be  This test is no t yet approved or cleared by the [place] FDA and  has been authorized for detection and/or diagnosis of SARS-CoV-2 by FDA under an Emergency Use Authorization (EUA). This EUA will remain  in effect (meaning this test can be used) for the duration of the COVID-19 declaration under Section 564(b)(1) of the Act, 21 U.S.C.section 360bbb-3(b)(1), unless the authorization is terminated  or revoked sooner.       Influenza A by PCR NEGATIVE NEGATIVE Final   Influenza B by PCR NEGATIVE  NEGATIVE Final    Comment: (NOTE) The Xpert Xpress SARS-CoV-2/FLU/RSV plus assay is intended as an aid in the diagnosis of influenza from Nasopharyngeal swab specimens and should not be used as a sole basis for treatment. Nasal washings and aspirates are unacceptable for Xpert Xpress SARS-CoV-2/FLU/RSV testing.  Fact Sheet for Patients: EntrepreneurPulse.com.au  Fact Sheet for Healthcare Providers: IncredibleEmployment.be  This test is not yet approved or cleared by the [place] FDA and has been authorized for detection and/or diagnosis of SARS-CoV-2 by FDA under an Emergency Use Authorization (EUA). This EUA will remain in effect (meaning this test can be used) for the duration of the COVID-19 declaration under Section 564(b)(1) of the Act, 21 U.S.C. section 360bbb-3(b)(1), unless the authorization is terminated or revoked.  Performed at [facility], [place] [address], [place], [place] [address]   Blood culture (routine x 2)     Status: None   Collection Time: 01/13/21  2:05 AM   Specimen: BLOOD  Result Value Ref Range Status   Specimen Description BLOOD LEFT ANTECUBITAL  Final   Special Requests   Final    BOTTLES DRAWN AEROBIC AND ANAEROBIC Blood Culture adequate volume   Culture   Final    NO GROWTH 5 DAYS Performed at [facility], [place] [address] [place], [place] [address]    Report Status 01/18/2021 FINAL  Final  Blood culture (routine x 2)     Status: None   Collection Time: 01/13/21  3:32 AM   Specimen: BLOOD  Result Value Ref Range Status   Specimen Description BLOOD SITE NOT SPECIFIED  Final   Special Requests   Final    BOTTLES DRAWN AEROBIC AND ANAEROBIC Blood Culture results may not be optimal due to an excessive volume of blood received in culture bottles   Culture   Final  NO GROWTH 5 DAYS Performed at [facility], [place] [address], [place], [place] [address]    Report Status 01/18/2021 FINAL   Final  Expectorated Sputum Assessment w Gram Stain, Rflx to Resp Cult     Status: None   Collection Time: 01/13/21 10:51 AM   Specimen: Expectorated Sputum  Result Value Ref Range Status   Specimen Description EXPECTORATED SPUTUM  Final   Special Requests NONE  Final   Sputum evaluation   Final    THIS SPECIMEN IS ACCEPTABLE FOR SPUTUM CULTURE Performed at [facility], [place] [address], [place], [place] [address]    Report Status 01/13/2021 FINAL  Final  MRSA Next Gen by PCR, Nasal     Status: None   Collection Time: 01/13/21 10:51 AM   Specimen: Nasal Mucosa; Nasal Swab  Result Value Ref Range Status   MRSA by PCR Next Gen NOT DETECTED NOT DETECTED Final    Comment: (NOTE) The GeneXpert MRSA Assay (FDA approved for NASAL specimens only), is one component of a comprehensive MRSA colonization surveillance program. It is not intended to diagnose MRSA infection nor to guide or monitor treatment for MRSA infections. Test performance is not FDA approved in patients less than 30 years old. Performed at [facility], [place] [address], [place], [place] [address]   Culture, Respiratory w Gram Stain     Status: None   Collection Time: 01/13/21 10:51 AM  Result Value Ref Range Status   Specimen Description EXPECTORATED SPUTUM  Final   Special Requests NONE Reflexed from [id number]  Final   Gram Stain   Final    RARE WBC PRESENT, PREDOMINANTLY PMN RARE GRAM POSITIVE COCCI IN PAIRS IN CHAINS RARE GRAM VARIABLE ROD    Culture   Final    FEW Normal respiratory flora-no Staph aureus or Pseudomonas seen Performed at [facility], [place] [address] [place], [place] [address]    Report Status 01/15/2021 FINAL  Final     Time coordinating discharge: 45 minutes  SIGNED:   Tawni Millers, MD  [facility] 01/20/2021, 8:48 AM

## 2021-01-23 DIAGNOSIS — J9601 Acute respiratory failure with hypoxia: Secondary | ICD-10-CM | POA: Diagnosis not present

## 2021-01-24 LAB — QUANTIFERON-TB GOLD PLUS

## 2021-01-27 ENCOUNTER — Telehealth: Payer: Self-pay | Admitting: Pulmonary Disease

## 2021-01-27 DIAGNOSIS — A430 Pulmonary nocardiosis: Secondary | ICD-10-CM

## 2021-01-27 LAB — SURGICAL PATHOLOGY

## 2021-01-27 MED ORDER — SULFAMETHOXAZOLE-TRIMETHOPRIM 800-160 MG PO TABS: 2.0000 | ORAL_TABLET | Freq: Two times a day (BID) | ORAL | 0 refills | Status: DC

## 2021-01-27 NOTE — Telephone Encounter (Signed)
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   Will plan to treat with Bactrim DS 2 tabs twice daily. We will monitor his renal function at follow up on 11/30.   I have spoken with infectious disease who will schedule him for follow up this coming week.   I notified the patient and his wife of these changes and they expressed understanding.   Freda Jackson, MD [facility] Office: [phone]

## 2021-01-27 NOTE — Addendum Note (Signed)
Addended by: Freda Jackson on: 01/27/2021 11:43 AM   Modules accepted: Orders

## 2021-01-30 NOTE — Progress Notes (Deleted)
[facility]                                                             [place], [place], [place], [address]                                                                  Phn. 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 Patient verbalized understanding of the plan. ____________________________________________________________________________________________________________________  HPI:  ROS: Constitutional: Negative for fever, chills, activity change, appetite change, fatigue and unexpected weight change.  Respiratory: Negative for cough, shortness of breath Cardiovascular: Negative for chest pain, palpitations and leg swelling.  Gastrointestinal: Negative for nausea, vomiting, abdominal pain, diarrhea/constipation, .  Genitourinary: Negative for dysuria, hematuria, flank pain Musculoskeletal: Negative for myalgias, arthralgia, back pain, joint swelling, arthralgias Skin: Negative for rashes, lesions  Neurological: Negative for weakness, dizziness or headache   Past Medical History:  Diagnosis Date   Hyperlipidemia    Past Surgical History:  Procedure Laterality Date   NO PAST SURGERIES     Allergies  Allergen Reactions   Asa [Aspirin] Nausea And Vomiting   Social History   Socioeconomic History   Marital status: Married    Spouse name: Not on file   Number of children: Not on file   Years of education: Not on file   Highest education level: Not on file  Occupational History   Not on file  Tobacco Use   Smoking status: Former    Types: Cigarettes   Smokeless tobacco: Never  Substance and Sexual Activity   Alcohol use: Never   Drug use:  Never   Sexual activity: Not on file  Other Topics Concern   Not on file  Social History Narrative   Not on file   Social Determinants of Health   Financial Resource Strain: Not on file  Food Insecurity: Not on file  Transportation Needs: Not on file  Physical Activity: Not on file  Stress: Not on file  Social Connections: Not on file  Intimate Partner Violence: Not on file   Family History  Problem Relation Age of Onset   Cancer Mother    Heart disease Father    Heart failure Father    Heart murmur Father    Hypertension Sister      Vitals   Examination  General - not in acute distress, comfortably sitting in chair HEENT - PEERLA, no pallor and no icterus Chest - b/l clear  air entry, no additional sounds CVS- Normal s1s2, RRR Abdomen - Soft, Non tender , non distended Ext- no pedal edema Neuro: grossly normal Back - WNL Psych : calm and cooperative   Recent labs CBC Latest Ref Rng & Units 01/19/2021 01/17/2021 01/16/2021  WBC 4.0 - 10.5 K/uL 25.4(H) 26.8(H) 25.9(H)  Hemoglobin 13.0 - 17.0 g/dL 9.3(L) 8.2(L) 8.1(L)  Hematocrit 39.0 - 52.0 % 30.6(L) 25.7(L) 25.8(L)  Platelets 150 - 400 K/uL 579(H) 417(H) 459(H)   CMP Latest Ref Rng & Units 01/19/2021 01/16/2021 01/15/2021  Glucose 70 - 99 mg/dL 156(H) 106(H) 104(H)  BUN 8 - 23 mg/dL 13 9 8   Creatinine 0.61 - 1.24 mg/dL 0.84 0.82 0.85  Sodium 135 - 145 mmol/L 135 131(L) 128(L)  Potassium 3.5 - 5.1 mmol/L 4.2 3.8 4.0  Chloride 98 - 111 mmol/L 101 101 98  CO2 22 - 32 mmol/L 26 22 22   Calcium 8.9 - 10.3 mg/dL 9.3 8.4(L) 8.1(L)  Total Protein 6.5 - 8.1 g/dL 7.6 - -  Total Bilirubin 0.3 - 1.2 mg/dL 0.7 - -  Alkaline Phos 38 - 126 U/L 124 - -  AST 15 - 41 U/L 33 - -  ALT 0 - 44 U/L 61(H) - -     Pertinent Microbiology Results for orders placed or performed during the hospital encounter of 01/12/21  Resp Panel by RT-PCR (Flu A&B, Covid) Nasopharyngeal Swab     Status: None   Collection Time: 01/13/21  1:18  AM   Specimen: Nasopharyngeal Swab; Nasopharyngeal(NP) swabs in vial transport medium  Result Value Ref Range Status   SARS Coronavirus 2 by RT PCR NEGATIVE NEGATIVE Final    Comment: (NOTE) SARS-CoV-2 target nucleic acids are NOT DETECTED.  The SARS-CoV-2 RNA is generally detectable in upper respiratory specimens during the acute phase of infection. The lowest concentration of SARS-CoV-2 viral copies this assay can detect is 138 copies/mL. A negative result does not preclude SARS-Cov-2 infection and should not be used as the sole basis for treatment or other patient management decisions. A negative result may occur with  improper specimen collection/handling, submission of specimen other than nasopharyngeal swab, presence of viral mutation(s) within the areas targeted by this assay, and inadequate number of viral copies(<138 copies/mL). A negative result must be combined with clinical observations, patient history, and epidemiological information. The expected result is Negative.  Fact Sheet for Patients:  EntrepreneurPulse.com.au  Fact Sheet for Healthcare Providers:  IncredibleEmployment.be  This test is no t yet approved or cleared by the [place] FDA and  has been authorized for detection and/or diagnosis of SARS-CoV-2 by FDA under an Emergency Use Authorization (EUA). This EUA will remain  in effect (meaning this test can be used) for the duration of the COVID-19 declaration under Section 564(b)(1) of the Act, 21 U.S.C.section 360bbb-3(b)(1), unless the authorization is terminated  or revoked sooner.       Influenza A by PCR NEGATIVE NEGATIVE Final   Influenza B by PCR NEGATIVE NEGATIVE Final    Comment: (NOTE) The Xpert Xpress SARS-CoV-2/FLU/RSV plus assay is intended as an aid in the diagnosis of influenza from Nasopharyngeal swab specimens and should not be used as a sole basis for treatment. Nasal washings and aspirates are  unacceptable for Xpert Xpress SARS-CoV-2/FLU/RSV testing.  Fact Sheet for Patients: EntrepreneurPulse.com.au  Fact Sheet for Healthcare Providers: IncredibleEmployment.be  This test is not yet approved or cleared by the [place] FDA and has been authorized for detection and/or diagnosis of SARS-CoV-2 by FDA  under an Emergency Use Authorization (EUA). This EUA will remain in effect (meaning this test can be used) for the duration of the COVID-19 declaration under Section 564(b)(1) of the Act, 21 U.S.C. section 360bbb-3(b)(1), unless the authorization is terminated or revoked.  Performed at [facility], [place] [address] [place], [place] [address]   Blood culture (routine x 2)     Status: None   Collection Time: 01/13/21  2:05 AM   Specimen: BLOOD  Result Value Ref Range Status   Specimen Description BLOOD LEFT ANTECUBITAL  Final   Special Requests   Final    BOTTLES DRAWN AEROBIC AND ANAEROBIC Blood Culture adequate volume   Culture   Final    NO GROWTH 5 DAYS Performed at [facility], [place] [address] [place], [place] [address]    Report Status 01/18/2021 FINAL  Final  Blood culture (routine x 2)     Status: None   Collection Time: 01/13/21  3:32 AM   Specimen: BLOOD  Result Value Ref Range Status   Specimen Description BLOOD SITE NOT SPECIFIED  Final   Special Requests   Final    BOTTLES DRAWN AEROBIC AND ANAEROBIC Blood Culture results may not be optimal due to an excessive volume of blood received in culture bottles   Culture   Final    NO GROWTH 5 DAYS Performed at [facility], [place] [address], [place], [place] [address]    Report Status 01/18/2021 FINAL  Final  Expectorated Sputum Assessment w Gram Stain, Rflx to Resp Cult     Status: None   Collection Time: 01/13/21 10:51 AM   Specimen: Expectorated Sputum  Result Value Ref Range Status   Specimen Description EXPECTORATED SPUTUM  Final   Special Requests  NONE  Final   Sputum evaluation   Final    THIS SPECIMEN IS ACCEPTABLE FOR SPUTUM CULTURE Performed at [facility], [place] [address] [place], [place] [address]    Report Status 01/13/2021 FINAL  Final  MRSA Next Gen by PCR, Nasal     Status: None   Collection Time: 01/13/21 10:51 AM   Specimen: Nasal Mucosa; Nasal Swab  Result Value Ref Range Status   MRSA by PCR Next Gen NOT DETECTED NOT DETECTED Final    Comment: (NOTE) The GeneXpert MRSA Assay (FDA approved for NASAL specimens only), is one component of a comprehensive MRSA colonization surveillance program. It is not intended to diagnose MRSA infection nor to guide or monitor treatment for MRSA infections. Test performance is not FDA approved in patients less than 5 years old. Performed at [facility], [place] [address], [place], [place] [address]   Culture, Respiratory w Gram Stain     Status: None   Collection Time: 01/13/21 10:51 AM  Result Value Ref Range Status   Specimen Description EXPECTORATED SPUTUM  Final   Special Requests NONE Reflexed from [id number]  Final   Gram Stain   Final    RARE WBC PRESENT, PREDOMINANTLY PMN RARE GRAM POSITIVE COCCI IN PAIRS IN CHAINS RARE GRAM VARIABLE ROD    Culture   Final    FEW Normal respiratory flora-no Staph aureus or Pseudomonas seen Performed at [facility], [place] [address], [place], [place] [address]    Report Status 01/15/2021 FINAL  Final    Pertinent Imaging DG Chest 2 View  Result Date: 01/16/2021 CLINICAL DATA:  Persistent pneumonia EXAM: CHEST - 2 VIEW COMPARISON:  Chest radiograph  01/12/2021, CT chest 01/13/2021 FINDINGS: The cardiomediastinal silhouette is grossly stable. Lung volumes are low. There is persistent confluent opacity projecting over the right lower lobe with silhouetting of the lateral portion of the right hemidiaphragm. The density appears increased compared to the study from 01/12/2021. The right upper lung remains well aerated. The left  lung is clear. There is a possible small right pleural effusion. There is no significant left effusion. There is no pneumothorax. The bones are stable. IMPRESSION: 1. Persistent confluent opacity projecting over the right lower lobe. Differential is unchanged, including infection or neoplasm. 2. Small right pleural effusion, not significantly changed Electronically Signed   By: Valetta Mole M.D.   On: 01/16/2021 08:40   DG Chest 2 View  Result Date: 01/12/2021 CLINICAL DATA:  Shortness of breath EXAM: CHEST - 2 VIEW COMPARISON:  Radiograph 01/03/2021 FINDINGS: Unchanged cardiomediastinal silhouette. Persistent right basilar opacities and small right pleural effusion. Left lung is clear. No visible pneumothorax. No acute osseous abnormalities. IMPRESSION: Persistent right basilar consolidation and small right pleural effusion. Electronically Signed   By: Maurine Simmering M.D.   On: 01/12/2021 16:33   DG Chest 2 View  Result Date: 01/03/2021 CLINICAL DATA:  Productive cough.  Pneumonia follow-up. EXAM: CHEST - 2 VIEW COMPARISON:  Chest x-ray dated December 18, 2020. FINDINGS: The heart size and mediastinal contours are within normal limits. Slightly decreased small right pleural effusion. Continued right lower lobe opacity and right hemidiaphragm elevation. The left lung is clear. No pneumothorax. No acute osseous abnormality. IMPRESSION: 1. Slightly decreased small right pleural effusion. 2. Continued right lower lobe atelectasis versus infiltrate. Electronically Signed   By: Titus Dubin M.D.   On: 01/03/2021 11:00   CT Chest Wo Contrast  Result Date: 01/13/2021 CLINICAL DATA:  Pneumonia, unresolved EXAM: CT CHEST WITHOUT CONTRAST TECHNIQUE: Multidetector CT imaging of the chest was performed following the standard protocol without IV contrast. COMPARISON:  Chest radiographs dated 01/12/2021. CTA chest dated 12/14/2020. FINDINGS: Cardiovascular: The heart is normal in size. No pericardial effusion. No  evidence of thoracic aortic aneurysm. Mediastinum/Nodes: Small mediastinal lymph nodes, including a dominant 18 mm short axis subcarinal node (series 3/image 86). Visualized thyroid is unremarkable. Lungs/Pleura: 10.2 x 6.6 x 6.5 cm bilobed mass along the inferior right middle and lower lobes (series 3/image 96), previously 10.6 x 8.2 x 6.2 cm. Given the slight decrease in measuring size, this could still reflect severe infection/pneumonia, but primary bronchogenic neoplasm is certainly not excluded. New patchy/nodular opacity in the medial right upper lobe (series 4/image 59). New 10 mm nodule in the anterior left upper lobe (series 4/image 77). New 2.7 cm nodule in the medial left upper lobe/perihilar region (series 4/image 90). Differential consideration includes multifocal infection versus metastatic disease. Associated small right pleural effusion, unchanged. No pneumothorax. Upper Abdomen: New 15 mm lesion along the anterior left upper kidney (series 3/image 172), not clearly evident on recent CT. 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 Suspected vertebral hemangioma along the anterior aspect of T11. IMPRESSION: 10.2 cm bilobed masslike opacity along the inferior right middle and lower lobes, possibly mildly improved from the prior. While this may reflect severe infection/pneumonia, given persistence, primary bronchogenic neoplasm is not excluded. Bronchoscopy is suggested. New multifocal patchy/nodular opacities in the lungs bilaterally, possibly reflecting multifocal infection versus metastatic disease. Associated mediastinal lymphadenopathy, including an 18 mm short axis subcarinal node. Small right pleural effusion, unchanged. New 15 mm lesion in the anterior left upper kidney, poorly evaluated on unenhanced CT. If this  patient has a documented pulmonary malignancy, this can be evaluated at the time of staging of the abdomen/pelvis. Electronically Signed   By: Julian Hy M.D.   On: 01/13/2021  02:46   CT PELVIS W CONTRAST  Result Date: 01/19/2021 CLINICAL DATA:  Prostate cancer, surveillance. EXAM: CT PELVIS WITH CONTRAST TECHNIQUE: Multidetector CT imaging of the pelvis was performed using the standard protocol following the bolus administration of intravenous contrast. CONTRAST:  40mL OMNIPAQUE IOHEXOL 300 MG/ML  SOLN COMPARISON:  No prior similar exam. Limited comparison is available with MRI abdomen without and with contrast 01/16/2021 showing indeterminate lesions of strong concern for metastasis in the T10, T11, L2, and L4 vertebrae. This study did not include the prostate. FINDINGS: Urinary Tract: The prostate is enlarged. This borders are not optimally delineated due to motion artifact. It is estimated to measure 6.0 cm transverse, 5.6 cm AP and 5.0 cm craniocaudal. There are calcifications in the prostate posteriorly on both sides. There is moderate impression on the posterior bladder and mild thickening of the bladder wall in general without masslike thickening. The distal ureters are clear. A small cyst is partially visible in the inferior pole of the right kidney. There is no intravesical stone. Are unremarkable. Bowel: Sigmoid diverticulosis without evidence of diverticulitis. Normal appendix. Otherwise unremarkable pelvic large and small bowel unopacified loops. Vascular/Lymphatic: No distal abdominal aortic aneurysm is seen. There is mild tortuosity and ectasia of the iliac arteries without stenosis or dissection. No enlarged pelvic lymph nodes are seen. There are slightly prominent bilateral inguinal chain nodes, up to 1.1 cm on the right and up to 1.0 cm on the left, nonspecific. There are pelvic phleboliths on both sides. The seminal vesicles Reproductive:  As above. Other: Small umbilical fat hernia. There is no free air, fluid or hemorrhage. 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 No grossly destructive bone lesion is visible. There is mild hip DJD,  enthesopathic change to the pelvic wings, mild spurring at the SI joints, pubic symphysis. 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 IMPRESSION: 1. Enlarged prostate with bladder base impression and calcifications posteriorly. 2. Bladder thickening which could be due to hypertrophy or cystitis. 3. Slightly prominent inguinal chain lymph nodes but no intrapelvic adenopathy. 4. Diverticulosis without evidence of diverticulitis. 5. Additional findings as above. Electronically Signed   By: Telford Nab M.D.   On: 01/19/2021 22:56   MR ABDOMEN W WO CONTRAST  Result Date: 01/16/2021 CLINICAL DATA:  Evaluate incidentally identified superior pole left renal lesion EXAM: MRI ABDOMEN WITHOUT AND WITH CONTRAST TECHNIQUE: Multiplanar multisequence MR imaging of the abdomen was performed both before and after the administration of intravenous contrast. CONTRAST:  7.61mL GADAVIST GADOBUTROL 1 MMOL/ML IV SOLN COMPARISON:  CT chest, 01/13/2021 FINDINGS: Examination is very limited by patient arm positioning, field inhomogeneity, and breath motion artifact throughout. Lower chest: Large mass in the lower right chest, partially imaged and better evaluated by recent prior CT. Hepatobiliary: No mass or other parenchymal abnormality identified. Status post cholecystectomy. No biliary ductal dilatation. Pancreas: No mass, inflammatory changes, or other parenchymal abnormality identified. No pancreatic ductal dilatation. Spleen:  Within normal limits in size and appearance. Adrenals/Urinary Tract: No masses identified. A lesion of the anterior superior pole of the left kidney identified by prior CT is of T1 and T2 intermediate signal, without evidence of associated contrast enhancement, measuring approximately 1.6 x 1.5 cm (series 1004, image  69). Additional nonenhancing cysts bilaterally, some of which may be thinly septated. No evidence of  hydronephrosis. Stomach/Bowel: Visualized portions within the abdomen are unremarkable. Vascular/Lymphatic: No pathologically enlarged lymph nodes identified. No abdominal aortic aneurysm demonstrated. Other:  Anasarca. Musculoskeletal: Multiple contrast enhancing osseous lesions throughout the included spine and pelvis, particularly of the T10, T11-L2, and L4 vertebral bodies. IMPRESSION: 1. Examination is technically very limited as detailed above. 2. Within this limitation, a lesion of the anterior superior pole of the left kidney identified by prior CT of the chest is of T1 and T2 intermediate signal, without evidence of associated contrast enhancement, measuring approximately 1.6 x 1.5 cm. This is most consistent with a hemorrhagic or proteinaceous cyst. 3. Multiple contrast enhancing osseous lesions throughout the included spine and pelvis, particularly of the T10, T11-L2, and L4 vertebral bodies. These do not clearly correspond to benign, trabeculated hemangiomata on prior CT and are highly suspicious for osseous metastatic disease. 4. Large mass in the lower right chest, partially imaged and better evaluated by recent CT. Electronically Signed   By: Delanna Ahmadi M.D.   On: 01/16/2021 10:09   DG CHEST PORT 1 VIEW  Result Date: 01/17/2021 CLINICAL DATA:  Pneumothorax status post biopsy. EXAM: PORTABLE CHEST 1 VIEW COMPARISON:  January 17, 2021 (12:14 p.m.) FINDINGS: A stable tiny right apical pneumothorax is seen. Stable pulmonary vascular congestion is seen with mild, stable areas of atelectasis noted within the bilateral lung bases. There is a small, stable right pleural effusion. The heart size and mediastinal contours are within normal limits. The visualized skeletal structures are unremarkable. IMPRESSION: 1. Stable exam with a stable tiny right apical pneumothorax. Electronically Signed   By: Virgina Norfolk M.D.   On: 01/17/2021 15:36   DG Chest Port 1 View  Result Date:  01/17/2021 CLINICAL DATA:  Minimal pneumothorax after biopsy EXAM: PORTABLE CHEST 1 VIEW COMPARISON:  Previous studies including the CT biopsy done earlier today FINDINGS: There is tiny right apical pneumothorax. Transverse diameter of heart is increased. Central pulmonary vessels are more prominent. There is interval increase in interstitial markings in the both parahilar regions, more so on the right side. Homogeneous opacity in the right lower lung fields has not changed significantly. Right pleural effusion is present with possible increase in size. IMPRESSION: There is tiny right apical pneumothorax. There is interval worsening of pulmonary vascular congestion. Increased interstitial markings in both lungs, more so on the right side suggests interstitial pulmonary edema, more so on the right side. Homogeneous opacity in the right lower lung fields may suggest atelectasis/pneumonia and right pleural effusion Electronically Signed   By: Elmer Picker M.D.   On: 01/17/2021 13:15   CT LUNG MASS BIOPSY  Result Date: 01/17/2021 INDICATION: 75 year old male with right lower lobe consolidation and bone lesions concerning for metastatic disease. Lung biopsy requested to evaluate for underlying malignancy. EXAM: CT-guided right lung biopsy COMPARISON:  01/13/2021 MEDICATIONS: None. ANESTHESIA/SEDATION: Fentanyl 50 mcg IV; Versed 1 mg IV Sedation time: 10 minutes; The patient was continuously monitored during the procedure by the interventional radiology nurse under my direct supervision. CONTRAST:  None COMPLICATIONS: SIR LEVEL B - Normal therapy, includes overnight admission for observation. Asymptomatic trace pneumothorax which improved after aspiration via introducer needle upon withdrawal. PROCEDURE: Informed consent was obtained from the patient following an explanation of the procedure, risks, benefits and alternatives. The patient understands,agrees and consents for the procedure. All questions were  addressed. A time out was performed prior to the initiation of  the procedure. The patient was positioned prone on the CT table and a limited chest CT was performed for procedural planning demonstrating consolidative opacity in the right lower lobe and small right pleural effusion. The operative site was prepped and draped in the usual sterile fashion. Under sterile conditions and local anesthesia, a 17 gauge coaxial needle was advanced into the peripheral aspect of the consolidation. Positioning was confirmed with intermittent CT fluoroscopy and followed by the acquisition of a total of 3 samples with an 18 gauge core needle biopsy device. Limited CT demonstrated trace pneumothorax. Aspiration was performed via the introducer needle upon removal. Superficial hemostasis was achieved with manual compression. Limited postprocedural chest CT demonstrated even smaller trace right posterior pneumothorax. A dressing was placed. The patient tolerated the procedure well. IMPRESSION: Technically successful CT guided core needle core biopsy of right lower lobe consolidative process versus mass. PLAN: Follow-up 1 hour chest radiograph to assess for resolution of pneumothorax. Ruthann Cancer, MD [facility] Electronically Signed   By: Ruthann Cancer M.D.   On: 01/17/2021 11:23     All pertinent labs/Imagings/notes reviewed. All pertinent plain films and CT images have been personally visualized and interpreted; radiology reports have been reviewed. Decision making incorporated into the Impression / Recommendations.  I have spent a total of 60 minutes of face-to-face and non-face-to-face time, excluding clinical staff time, preparing to see patient, ordering tests and/or medications, and provide counseling the patient    Electronically signed by:  Rosiland Oz, MD Infectious Disease Physician [facility] [address] [place], [place] [address] Phone: [phone]  Fax: [phone]

## 2021-01-31 ENCOUNTER — Ambulatory Visit: Payer: Medicare Other | Admitting: Infectious Diseases

## 2021-01-31 DIAGNOSIS — R7303 Prediabetes: Secondary | ICD-10-CM | POA: Diagnosis not present

## 2021-01-31 DIAGNOSIS — J90 Pleural effusion, not elsewhere classified: Secondary | ICD-10-CM | POA: Diagnosis not present

## 2021-02-01 ENCOUNTER — Encounter: Payer: Self-pay | Admitting: Pulmonary Disease

## 2021-02-01 ENCOUNTER — Other Ambulatory Visit: Payer: Self-pay

## 2021-02-01 ENCOUNTER — Ambulatory Visit: Payer: Medicare Other | Admitting: Pulmonary Disease

## 2021-02-01 VITALS — BP 116/72 | HR 88 | Ht 68.0 in | Wt 161.2 lb

## 2021-02-01 DIAGNOSIS — A430 Pulmonary nocardiosis: Secondary | ICD-10-CM | POA: Diagnosis not present

## 2021-02-01 DIAGNOSIS — J8489 Other specified interstitial pulmonary diseases: Secondary | ICD-10-CM

## 2021-02-01 LAB — CBC WITH DIFFERENTIAL/PLATELET
Basophils Absolute: 0 10*3/uL (ref 0.0–0.1)
Basophils Relative: 0 % (ref 0.0–3.0)
Eosinophils Absolute: 0 10*3/uL (ref 0.0–0.7)
Eosinophils Relative: 0 % (ref 0.0–5.0)
HCT: 31 % — ABNORMAL LOW (ref 39.0–52.0)
Hemoglobin: 9.8 g/dL — ABNORMAL LOW (ref 13.0–17.0)
Lymphocytes Relative: 2.1 % — ABNORMAL LOW (ref 12.0–46.0)
Lymphs Abs: 0.6 10*3/uL — ABNORMAL LOW (ref 0.7–4.0)
MCHC: 31.6 g/dL (ref 30.0–36.0)
MCV: 87.6 fl (ref 78.0–100.0)
Monocytes Absolute: 0.6 10*3/uL (ref 0.1–1.0)
Monocytes Relative: 2.2 % — ABNORMAL LOW (ref 3.0–12.0)
Neutro Abs: 27.7 10*3/uL — ABNORMAL HIGH (ref 1.4–7.7)
Neutrophils Relative %: 95.7 % — ABNORMAL HIGH (ref 43.0–77.0)
Platelets: 373 10*3/uL (ref 150.0–400.0)
RBC: 3.54 Mil/uL — ABNORMAL LOW (ref 4.22–5.81)
RDW: 17.5 % — ABNORMAL HIGH (ref 11.5–15.5)
WBC: 28.9 10*3/uL (ref 4.0–10.5)

## 2021-02-01 LAB — COMPREHENSIVE METABOLIC PANEL
ALT: 24 U/L (ref 0–53)
AST: 15 U/L (ref 0–37)
Albumin: 3.6 g/dL (ref 3.5–5.2)
Alkaline Phosphatase: 76 U/L (ref 39–117)
BUN: 18 mg/dL (ref 6–23)
CO2: 28 mEq/L (ref 19–32)
Calcium: 9.8 mg/dL (ref 8.4–10.5)
Chloride: 95 mEq/L — ABNORMAL LOW (ref 96–112)
Creatinine, Ser: 1.35 mg/dL (ref 0.40–1.50)
GFR: 50.55 mL/min — ABNORMAL LOW (ref >60.00)
Glucose, Bld: 96 mg/dL (ref 70–99)
Potassium: 4.6 mEq/L (ref 3.5–5.1)
Sodium: 131 mEq/L — ABNORMAL LOW (ref 135–145)
Total Bilirubin: 0.5 mg/dL (ref 0.2–1.2)
Total Protein: 7.4 g/dL (ref 6.0–8.3)

## 2021-02-01 LAB — C-REACTIVE PROTEIN: CRP: 8.4 mg/dL (ref 0.5–20.0)

## 2021-02-01 NOTE — Patient Instructions (Addendum)
Continue taking Bactrim 2 tabs twice daily  We will follow up with the Infectious Disease office for follow up of the Nocardia Pneumonia  Continue prednisone 40mg  daily (2 tabs daily) for 2 weeks, then taper down to 20mg  daily (1 tab daily) for 2 weeks.   We will check labs today  Follow up in 4 weeks  Happy Holidays!

## 2021-02-01 NOTE — Progress Notes (Signed)
Synopsis: Referred in November 2022 for Hospital Follow Up of Nocardia Pneumonia with Organizing pneumonia features  Subjective:   PATIENT ID: Brett Bean GENDER: male DOB: 09-06-1945, MRN: [id number]  HPI  Chief Complaint  Patient presents with   Hospitalization Follow-up    HFU. States he has been doing well since being home.    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   Patient was admitted 10/13 to 10/16 for pneumonia and discharged home on extended course of Augmentin. He was re-admitted 11/11 to 11/18 with progressive symptoms and new oxygen requirement. There was not much change in the 10.2cm bilobed mass like opacity of the right middle and lower lobe with new multifocal patchy/nodular opacities in the lungs bilaterally.  Patient had CT-guided biopsy of the right lower lobe mass lesion via interventional radiology.  Pathology was notable for acute and organizing pneumonia with GMS staining showing presence of filamentous organisms consistent with nocardia.  Patient was started on high-dose steroid treatment for the organizing pneumonia on 01/18/2021.  Bactrim DS 2 tabs twice daily was then started on 01/27/2021 when pathology staining showed evidence of Nocardia.  Patient reports that his breathing is much better since hospitalization.  He complains of urinary frequency throughout the day and night.  He denies any fevers, chills or night sweats.  His daughter and wife are with him today.  Past Medical History:  Diagnosis Date   Hyperlipidemia      Family History  Problem Relation Age of Onset   Cancer Mother    Heart disease Father    Heart failure Father    Heart murmur Father    Hypertension Sister      Social History   Socioeconomic History   Marital status: Married    Spouse name: Not on file   Number of children: Not on file   Years of  education: Not on file   Highest education level: Not on file  Occupational History   Not on file  Tobacco Use   Smoking status: Former    Types: Cigarettes   Smokeless tobacco: Never  Substance and Sexual Activity   Alcohol use: Never   Drug use: Never   Sexual activity: Not on file  Other Topics Concern   Not on file  Social History Narrative   Not on file   Social Determinants of Health   Financial Resource Strain: Not on file  Food Insecurity: Not on file  Transportation Needs: Not on file  Physical Activity: Not on file  Stress: Not on file  Social Connections: Not on file  Intimate Partner Violence: Not on file     Allergies  Allergen Reactions   Asa [Aspirin] Nausea And Vomiting     Outpatient Medications Prior to Visit  Medication Sig Dispense Refill   acetaminophen (TYLENOL) 500 MG tablet Take 1,000 mg by mouth every 6 (six) hours as needed for moderate pain or fever.     cholecalciferol (VITAMIN D) 25 MCG (1000 UNIT) tablet Take 1,000 Units by mouth daily.     Lactobacillus (PROBIOTIC ACIDOPHILUS) CAPS Take 1 capsule by mouth daily.     Misc Natural Products (PROSTATE) CAPS Take 1 capsule by mouth in the morning and at bedtime. supplement     Multiple Vitamins-Minerals (EYE VITAMINS PO) Take 1 tablet by mouth daily. supplement     predniSONE (DELTASONE) 20 MG tablet Take  3 tablets (60 mg total) by mouth daily with breakfast. 90 tablet 0   sulfamethoxazole-trimethoprim (BACTRIM DS) 800-160 MG tablet Take 2 tablets by mouth 2 (two) times daily. 120 tablet 0   No facility-administered medications prior to visit.    Review of Systems  Constitutional:  Negative for chills, fever, malaise/fatigue and weight loss.  HENT:  Negative for congestion, sinus pain and sore throat.   Eyes: Negative.   Respiratory:  Positive for cough and shortness of breath. Negative for hemoptysis, sputum production and wheezing.   Cardiovascular:  Negative for chest pain, palpitations,  orthopnea, claudication and leg swelling.  Gastrointestinal:  Negative for abdominal pain, heartburn, nausea and vomiting.  Genitourinary:  Positive for frequency. Negative for hematuria.  Musculoskeletal:  Negative for joint pain and myalgias.  Skin:  Negative for rash.  Neurological:  Negative for weakness.  Endo/Heme/Allergies: Negative.  Does not bruise/bleed easily.  Psychiatric/Behavioral: Negative.     Objective:   Vitals:   02/01/21 1335  BP: 116/72  Pulse: 88  SpO2: 94%  Weight: 161 lb 3.2 oz (73.1 kg)  Height: 5\' 8"  (1.727 m)   Physical Exam Constitutional:      General: He is not in acute distress. HENT:     Head: Normocephalic and atraumatic.  Eyes:     Extraocular Movements: Extraocular movements intact.     Conjunctiva/sclera: Conjunctivae normal.     Pupils: Pupils are equal, round, and reactive to light.  Cardiovascular:     Rate and Rhythm: Normal rate and regular rhythm.     Pulses: Normal pulses.     Heart sounds: Normal heart sounds. No murmur heard. Pulmonary:     Effort: Pulmonary effort is normal.     Breath sounds: Rales (right base) present. No wheezing or rhonchi.  Abdominal:     General: Bowel sounds are normal.     Palpations: Abdomen is soft.  Musculoskeletal:     Right lower leg: No edema.     Left lower leg: No edema.  Lymphadenopathy:     Cervical: No cervical adenopathy.  Skin:    General: Skin is warm and dry.  Neurological:     General: No focal deficit present.     Mental Status: He is alert.  Psychiatric:        Mood and Affect: Mood normal.        Behavior: Behavior normal.        Thought Content: Thought content normal.        Judgment: Judgment normal.   CBC    Component Value Date/Time   WBC 25.4 (H) 01/19/2021 0826   RBC 3.39 (L) 01/19/2021 0826   HGB 9.3 (L) 01/19/2021 0826   HCT 30.6 (L) 01/19/2021 0826   PLT 579 (H) 01/19/2021 0826   MCV 90.3 01/19/2021 0826   MCH 27.4 01/19/2021 0826   MCHC 30.4 01/19/2021  0826   RDW 14.6 01/19/2021 0826   LYMPHSABS 0.8 01/19/2021 0826   MONOABS 0.5 01/19/2021 0826   EOSABS 0.0 01/19/2021 0826   BASOSABS 0.0 01/19/2021 0826   BMP Latest Ref Rng & Units 02/01/2021 01/19/2021 01/16/2021  Glucose 70 - 99 mg/dL 96 156(H) 106(H)  BUN 6 - 23 mg/dL 18 13 9   Creatinine 0.40 - 1.50 mg/dL 1.35 0.84 0.82  Sodium 135 - 145 mEq/L 131(L) 135 131(L)  Potassium 3.5 - 5.1 mEq/L 4.6 4.2 3.8  Chloride 96 - 112 mEq/L 95(L) 101 101  CO2 19 - 32 mEq/L 28 26 22  Calcium 8.4 - 10.5 mg/dL 9.8 9.3 8.4(L)    Chest imaging: CT Chest 01/19/21 10.2 cm bilobed masslike opacity along the inferior right middle and lower lobes, possibly mildly improved from the prior. While this may reflect severe infection/pneumonia, given persistence, primary bronchogenic neoplasm is not excluded. Bronchoscopy is suggested.   New multifocal patchy/nodular opacities in the lungs bilaterally, possibly reflecting multifocal infection versus metastatic disease. Associated mediastinal lymphadenopathy, including an 18 mm short axis subcarinal node.   Small right pleural effusion, unchanged.  PFT: No flowsheet data found.  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   He has been on 60 mg of prednisone daily and Bactrim DS 2 tabs twice daily.  Labs today show increased creatinine in setting of Bactrim use.  We will follow-up labs in 1 week to continue to monitor renal function.  Encourage patient to stay well-hydrated.    We will lower his dose of prednisone to 40 mg daily over the next 2 weeks and then he can taper down to 20 mg  daily thereafter until follow-up.  We will refer him to infectious disease for further follow-up as well for the nocardia pneumonia.  Follow-up in 4 weeks.  Freda Jackson, MD [facility] Office: [phone]   Current Outpatient Medications:    acetaminophen (TYLENOL) 500 MG tablet, Take 1,000 mg by mouth every 6 (six) hours as needed for moderate pain or fever., Disp: , Rfl:    cholecalciferol (VITAMIN D) 25 MCG (1000 UNIT) tablet, Take 1,000 Units by mouth daily., Disp: , Rfl:    Lactobacillus (PROBIOTIC ACIDOPHILUS) CAPS, Take 1 capsule by mouth daily., Disp: , Rfl:    Misc Natural Products (PROSTATE) CAPS, Take 1 capsule by mouth in the morning and at bedtime. supplement, Disp: , Rfl:    Multiple Vitamins-Minerals (EYE VITAMINS PO), Take 1 tablet by mouth daily. supplement, Disp: , Rfl:    predniSONE (DELTASONE) 20 MG tablet, Take 3 tablets (60 mg total) by mouth daily with breakfast., Disp: 90 tablet, Rfl: 0   sulfamethoxazole-trimethoprim (BACTRIM DS) 800-160 MG tablet, Take 2 tablets by mouth 2 (two) times daily., Disp: 120 tablet, Rfl: 0

## 2021-02-02 ENCOUNTER — Institutional Professional Consult (permissible substitution): Payer: Medicare Other | Admitting: Pulmonary Disease

## 2021-02-03 ENCOUNTER — Other Ambulatory Visit: Payer: Self-pay | Admitting: Pulmonary Disease

## 2021-02-03 DIAGNOSIS — J8489 Other specified interstitial pulmonary diseases: Secondary | ICD-10-CM

## 2021-02-03 NOTE — Progress Notes (Signed)
I called the patient and spoke with his wife (DPR)  and she voices understanding. She will come back to get the blood work on 02/07/21 or 02/08/21. Patient is aware to come in the office to get labs completed. Nothing further need.

## 2021-02-04 ENCOUNTER — Encounter: Payer: Self-pay | Admitting: Pulmonary Disease

## 2021-02-07 ENCOUNTER — Other Ambulatory Visit (INDEPENDENT_AMBULATORY_CARE_PROVIDER_SITE_OTHER): Payer: Medicare Other

## 2021-02-07 DIAGNOSIS — J8489 Other specified interstitial pulmonary diseases: Secondary | ICD-10-CM

## 2021-02-07 LAB — BASIC METABOLIC PANEL
BUN: 20 mg/dL (ref 6–23)
CO2: 26 mEq/L (ref 19–32)
Calcium: 9.6 mg/dL (ref 8.4–10.5)
Chloride: 95 mEq/L — ABNORMAL LOW (ref 96–112)
Creatinine, Ser: 1.18 mg/dL (ref 0.40–1.50)
GFR: 59.4 mL/min — ABNORMAL LOW (ref >60.00)
Glucose, Bld: 84 mg/dL (ref 70–99)
Potassium: 4.3 mEq/L (ref 3.5–5.1)
Sodium: 130 mEq/L — ABNORMAL LOW (ref 135–145)

## 2021-02-14 NOTE — Progress Notes (Signed)
I called and spoke with the pt and his spouse and notified of results. They were unsure of still needed ID appt. I advised yes, and told them to call them back and schedule this. Pt verbalized understanding of this.

## 2021-02-20 DIAGNOSIS — J90 Pleural effusion, not elsewhere classified: Secondary | ICD-10-CM | POA: Diagnosis not present

## 2021-02-20 DIAGNOSIS — R7303 Prediabetes: Secondary | ICD-10-CM | POA: Diagnosis not present

## 2021-02-22 DIAGNOSIS — J9601 Acute respiratory failure with hypoxia: Secondary | ICD-10-CM | POA: Diagnosis not present

## 2021-02-23 ENCOUNTER — Other Ambulatory Visit: Payer: Self-pay

## 2021-02-23 ENCOUNTER — Ambulatory Visit: Payer: Medicare Other | Admitting: Internal Medicine

## 2021-02-23 ENCOUNTER — Ambulatory Visit
Admission: RE | Admit: 2021-02-23 | Discharge: 2021-02-23 | Disposition: A | Discharge status: Home / Self Care | Payer: Medicare Other | Source: Ambulatory Visit | Attending: Internal Medicine | Admitting: Internal Medicine

## 2021-02-23 ENCOUNTER — Ambulatory Visit (INDEPENDENT_AMBULATORY_CARE_PROVIDER_SITE_OTHER): Payer: Medicare Other

## 2021-02-23 ENCOUNTER — Encounter: Payer: Self-pay | Admitting: Internal Medicine

## 2021-02-23 VITALS — BP 124/75 | HR 91 | Temp 97.6°F | Wt 166.0 lb

## 2021-02-23 DIAGNOSIS — N400 Enlarged prostate without lower urinary tract symptoms: Secondary | ICD-10-CM

## 2021-02-23 DIAGNOSIS — Z23 Encounter for immunization: Secondary | ICD-10-CM

## 2021-02-23 DIAGNOSIS — R972 Elevated prostate specific antigen [PSA]: Secondary | ICD-10-CM | POA: Diagnosis not present

## 2021-02-23 DIAGNOSIS — Q799 Congenital malformation of musculoskeletal system, unspecified: Secondary | ICD-10-CM | POA: Diagnosis not present

## 2021-02-23 DIAGNOSIS — A430 Pulmonary nocardiosis: Secondary | ICD-10-CM

## 2021-02-23 DIAGNOSIS — J984 Other disorders of lung: Secondary | ICD-10-CM | POA: Diagnosis not present

## 2021-02-23 MED ORDER — SULFAMETHOXAZOLE-TRIMETHOPRIM 800-160 MG PO TABS: 2.0000 | ORAL_TABLET | Freq: Two times a day (BID) | ORAL | 3 refills | Status: DC

## 2021-02-23 NOTE — Progress Notes (Signed)
° °  Covid-19 Vaccination Clinic  Name:  Brett Bean    MRN: [id number] DOB: [birth year]  02/23/2021  [name] was observed post Covid-19 immunization for 15 minutes without incident. He was provided with Vaccine Information Sheet and instruction to access the V-Safe system.   [name] was instructed to call 911 with any severe reactions post vaccine: Difficulty breathing  Swelling of face and throat  A fast heartbeat  A bad rash all over body  Dizziness and weakness   Immunizations Administered     Name Date Dose VIS Date Route   Pfizer Covid-19 Vaccine Bivalent Booster 02/23/2021 10:52 AM 0.3 mL 11/02/2020 Intramuscular   Manufacturer: [place]   Lot: [id number]   [place]: [phone]       Adelfa Koh, CMA

## 2021-02-23 NOTE — Progress Notes (Signed)
[facility] for Infectious Disease  Reason for Consult: Nocardia pneumonia Referring Provider: Dr. Freda Jackson  Assessment: He has nocardia pneumonia.  Fortunately he has had marked improvement on full dose trimethoprim/sulfamethoxazole.  His recent creatinine was normal and he is tolerating treatment well.  I will repeat blood work today, continue trimethoprim/sulfamethoxazole and check a chest x-ray.  I have encouraged him to get a influenza vaccine and COVID booster vaccine.  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   CHOLECALCIFEROL (VITAMIN D) 25 MCG (1000 UNIT) TABLET    Take 1,000 Units by mouth daily.   LACTOBACILLUS (PROBIOTIC ACIDOPHILUS) CAPS    Take 1 capsule by mouth daily.   MISC NATURAL PRODUCTS (PROSTATE) CAPS    Take 1 capsule by mouth in the morning and at bedtime. supplement   MULTIPLE VITAMINS-MINERALS (EYE VITAMINS PO)    Take 1 tablet by mouth daily. supplement   SULFAMETHOXAZOLE-TRIMETHOPRIM (BACTRIM DS) 800-160 MG TABLET    Take 2 tablets by mouth 2 (two) times daily.  Modified Medications   No medications on file   Discontinued Medications   No medications on file    HPI: Brett Bean is a 75 y.o. male who has previously been in excellent health.  In early October he began to develop productive cough, pleuritic chest pain, fever, chills, sweats and weight loss.  He was hospitalized on 12/14/2020 and found to have a right base pneumonia with effusion.  Screening test for MRSA and COVID were negative.  Blood cultures were negative.  He had some improvement with IV antibiotics and was discharged on 12/18/2020 on amoxicillin clavulanate.  He was feeling slightly better when he completed that but started having worsening symptoms again after completing antibiotic therapy leading to readmission on 01/13/2021.  CT showed a persistent right base mass with bilateral moderate the focal multifocal opacities  HIV antibody, ANA, ANCA, Fungitell, blood cultures and sputum cultures were all negative.  His QuantiFERON was hemolyzed.  He underwent a CT-guided biopsy on 01/17/2021 which showed organizing pneumonia.  He was discharged on 60 mg of prednisone and prophylactic trimethoprim sulfamethoxazole.  After discharge an addendum to the pathology report noted filamentous organisms compatible with nocardia.  He was changed to daily, high-dose trimethoprim/sulfamethoxazole on 01/27/2021.  His prednisone has been tapered down to 20 mg daily.  He is feeling much better.  Longer has any fever, chills, sweats, cough or pleuritic chest pain.  While hospitalized there was also the incidental findings of increased prostate size, enhancing bone lesions in his spine and pelvis and elevated PSA.  He tells me that  Dr. Berdine Addison, his PCP is planning on making a referral to urology.  Review of Systems: Review of Systems  Constitutional:  Positive for weight loss. Negative for chills, diaphoresis and fever.  Respiratory:  Negative for cough, hemoptysis, sputum production, shortness of breath and wheezing.   Cardiovascular:  Negative for chest  pain.  Gastrointestinal:  Negative for abdominal pain, diarrhea, nausea and vomiting.  Genitourinary:  Negative for dysuria.     Past Medical History:  Diagnosis Date   Hyperlipidemia     Social History   Tobacco Use   Smoking status: Former    Types: Cigarettes   Smokeless tobacco: Never  Substance Use Topics   Alcohol use: Never   Drug use: Never    Family History  Problem Relation Age of Onset   Cancer Mother    Heart disease Father    Heart failure Father    Heart murmur Father    Hypertension Sister    Allergies  Allergen Reactions   Asa [Aspirin] Nausea And Vomiting    OBJECTIVE: Vitals:   02/23/21 0947  Weight: 166 lb (75.3 kg)   Body mass index is 25.24 kg/m.   Physical Exam Constitutional:      Comments: He is very pleasant.  He is accompanied by his wife and daughter.  Cardiovascular:     Rate and Rhythm: Normal rate and regular rhythm.     Heart sounds: No murmur heard. Pulmonary:     Effort: Pulmonary effort is normal.     Breath sounds: No wheezing, rhonchi or rales.     Comments: Slightly diminished breath sounds in his right base posteriorly. Psychiatric:        Mood and Affect: Mood normal.      Microbiology: No results found for this or any previous visit (from the past 240 hour(s)).  Michel Bickers, MD [facility] [phone] pager   [phone] cell 02/23/2021, 9:48 AM

## 2021-02-24 LAB — BASIC METABOLIC PANEL
BUN: 15 mg/dL (ref 7–25)
CO2: 26 mmol/L (ref 20–32)
Calcium: 9.3 mg/dL (ref 8.6–10.3)
Chloride: 101 mmol/L (ref 98–110)
Creat: 1.09 mg/dL (ref 0.70–1.28)
Glucose, Bld: 89 mg/dL (ref 65–99)
Potassium: 4.4 mmol/L (ref 3.5–5.3)
Sodium: 137 mmol/L (ref 135–146)

## 2021-02-24 LAB — CBC
HCT: 33.1 % — ABNORMAL LOW (ref 38.5–50.0)
Hemoglobin: 10.8 g/dL — ABNORMAL LOW (ref 13.2–17.1)
MCH: 28.8 pg (ref 27.0–33.0)
MCHC: 32.6 g/dL (ref 32.0–36.0)
MCV: 88.3 fL (ref 80.0–100.0)
MPV: 9.3 fL (ref 7.5–12.5)
Platelets: 291 10*3/uL (ref 140–400)
RBC: 3.75 10*6/uL — ABNORMAL LOW (ref 4.20–5.80)
RDW: 17 % — ABNORMAL HIGH (ref 11.0–15.0)
WBC: 12.4 10*3/uL — ABNORMAL HIGH (ref 3.8–10.8)

## 2021-03-01 ENCOUNTER — Ambulatory Visit: Payer: Medicare Other | Admitting: Pulmonary Disease

## 2021-03-01 ENCOUNTER — Encounter: Payer: Self-pay | Admitting: Pulmonary Disease

## 2021-03-01 ENCOUNTER — Other Ambulatory Visit: Payer: Self-pay

## 2021-03-01 VITALS — BP 128/78 | HR 66 | Ht 68.0 in | Wt 167.4 lb

## 2021-03-01 DIAGNOSIS — J8489 Other specified interstitial pulmonary diseases: Secondary | ICD-10-CM | POA: Diagnosis not present

## 2021-03-01 DIAGNOSIS — A430 Pulmonary nocardiosis: Secondary | ICD-10-CM

## 2021-03-01 NOTE — Progress Notes (Signed)
Synopsis: Referred in November 2022 for Hospital Follow Up of Nocardia Pneumonia with Organizing pneumonia features  Subjective:   PATIENT ID: Brett Bean GENDER: male DOB: 04-Jun-1945, MRN: [id number]  HPI  Chief Complaint  Patient presents with   Follow-up    4 wk f/u for PNA. States he has been doing well since last visit. Denies any new symptoms.    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   Patient has done well since last visit and continues on bactrim therapy for the nocardia and prednisone for the organizing pneumonia. He is down to 20mg  of prednisone daily. He was evaluated by infectious disease on 02/23/21 with repeat chest radiograph which showed improvement in the right pleural effusion and right lower lobe mass.   Patient denies night sweats or shortness of breath. He continues to use oxygen at night when sleeping.   OV 02/01/21 Patient was admitted 10/13 to 10/16 for pneumonia and discharged home on extended course of Augmentin. He was re-admitted 11/11 to 11/18 with progressive symptoms and new oxygen requirement. There was not much change in the 10.2cm bilobed mass like opacity of the right middle and lower lobe with new multifocal patchy/nodular opacities in the lungs bilaterally.  Patient had CT-guided biopsy of the right lower lobe mass lesion via interventional radiology.  Pathology was notable for acute and organizing pneumonia with GMS staining showing presence of filamentous organisms consistent with nocardia.  Patient was started on high-dose steroid treatment for the organizing pneumonia on 01/18/2021.  Bactrim DS 2 tabs twice daily was then started on 01/27/2021 when pathology staining showed evidence of Nocardia.  Patient reports that his breathing is much better since hospitalization.  He complains of urinary frequency throughout the day and  night.  He denies any fevers, chills or night sweats.  His daughter and wife are with him today.  Past Medical History:  Diagnosis Date   Hyperlipidemia      Family History  Problem Relation Age of Onset   Cancer Mother    Heart disease Father    Heart failure Father    Heart murmur Father    Hypertension Sister      Social History   Socioeconomic History   Marital status: Married    Spouse name: Not on file   Number of children: Not on file   Years of education: Not on file   Highest education level: Not on file  Occupational History   Not on file  Tobacco Use   Smoking status: Former    Types: Cigarettes   Smokeless tobacco: Never  Substance and Sexual Activity   Alcohol use: Never   Drug use: Never   Sexual activity: Not on file  Other Topics Concern   Not on file  Social History Narrative   Not on file   Social Determinants of Health   Financial Resource Strain: Not on file  Food Insecurity: Not on file  Transportation Needs: Not on file  Physical Activity: Not on file  Stress: Not on file  Social Connections: Not on file  Intimate Partner Violence: Not on file     Allergies  Allergen Reactions   Asa [Aspirin] Nausea And Vomiting     Outpatient Medications Prior to Visit  Medication Sig Dispense Refill   acetaminophen (TYLENOL) 500 MG tablet Take 1,000 mg by mouth every 6 (six) hours as needed  for moderate pain or fever.     cholecalciferol (VITAMIN D) 25 MCG (1000 UNIT) tablet Take 1,000 Units by mouth daily.     Lactobacillus (PROBIOTIC ACIDOPHILUS) CAPS Take 1 capsule by mouth daily.     Misc Natural Products (PROSTATE) CAPS Take 1 capsule by mouth in the morning and at bedtime. supplement     Multiple Vitamins-Minerals (EYE VITAMINS PO) Take 1 tablet by mouth daily. supplement     predniSONE (DELTASONE) 20 MG tablet Take 20 mg by mouth daily with breakfast.     sulfamethoxazole-trimethoprim (BACTRIM DS) 800-160 MG tablet Take 2 tablets by mouth  2 (two) times daily. 120 tablet 3   No facility-administered medications prior to visit.   Review of Systems  Constitutional:  Negative for chills, fever, malaise/fatigue and weight loss.  HENT:  Negative for congestion, sinus pain and sore throat.   Eyes: Negative.   Respiratory:  Negative for cough, hemoptysis, sputum production, shortness of breath and wheezing.   Cardiovascular:  Negative for chest pain, palpitations, orthopnea, claudication and leg swelling.  Gastrointestinal:  Negative for abdominal pain, heartburn, nausea and vomiting.  Genitourinary:  Negative for frequency and hematuria.  Musculoskeletal:  Negative for joint pain and myalgias.  Skin:  Negative for rash.  Neurological:  Negative for weakness.  Endo/Heme/Allergies: Negative.  Does not bruise/bleed easily.  Psychiatric/Behavioral: Negative.     Objective:   Vitals:   03/01/21 1007  BP: 128/78  Pulse: 66  SpO2: 97%  Weight: 167 lb 6.4 oz (75.9 kg)  Height: 5\' 8"  (1.727 m)   Physical Exam Constitutional:      General: He is not in acute distress. HENT:     Head: Normocephalic and atraumatic.  Eyes:     Conjunctiva/sclera: Conjunctivae normal.  Cardiovascular:     Rate and Rhythm: Normal rate and regular rhythm.     Pulses: Normal pulses.     Heart sounds: Normal heart sounds. No murmur heard. Pulmonary:     Effort: Pulmonary effort is normal.     Breath sounds: No wheezing, rhonchi or rales.  Musculoskeletal:     Right lower leg: No edema.     Left lower leg: No edema.  Skin:    General: Skin is warm and dry.  Neurological:     General: No focal deficit present.     Mental Status: He is alert.  Psychiatric:        Mood and Affect: Mood normal.        Behavior: Behavior normal.        Thought Content: Thought content normal.        Judgment: Judgment normal.   CBC    Component Value Date/Time   WBC 12.4 (H) 02/23/2021 1043   RBC 3.75 (L) 02/23/2021 1043   HGB 10.8 (L) 02/23/2021 1043    HCT 33.1 (L) 02/23/2021 1043   PLT 291 02/23/2021 1043   MCV 88.3 02/23/2021 1043   MCH 28.8 02/23/2021 1043   MCHC 32.6 02/23/2021 1043   RDW 17.0 (H) 02/23/2021 1043   LYMPHSABS 0.6 (L) 02/01/2021 1412   MONOABS 0.6 02/01/2021 1412   EOSABS 0.0 02/01/2021 1412   BASOSABS 0.0 02/01/2021 1412   BMP Latest Ref Rng & Units 02/23/2021 02/07/2021 02/01/2021  Glucose 65 - 99 mg/dL 89 84 96  BUN 7 - 25 mg/dL 15 20 18   Creatinine 0.70 - 1.28 mg/dL 1.09 1.18 1.35  BUN/Creat Ratio 6 - 22 (calc) NOT APPLICABLE - -  Sodium 938 -  146 mmol/L 137 130(L) 131(L)  Potassium 3.5 - 5.3 mmol/L 4.4 4.3 4.6  Chloride 98 - 110 mmol/L 101 95(L) 95(L)  CO2 20 - 32 mmol/L 26 26 28   Calcium 8.6 - 10.3 mg/dL 9.3 9.6 9.8    Chest imaging: CXR 02/23/21 Cardiomediastinal contours and hilar structures are stable. Lung show improved aeration. Mild blunting of RIGHT costodiaphragmatic sulcus with persistent RIGHT lower lobe airspace disease partially obscuring the RIGHT hemidiaphragmatic contour.   LEFT chest is clear.  No visible pneumothorax.   On limited assessment there is no acute skeletal process.  CT Chest 01/19/21 10.2 cm bilobed masslike opacity along the inferior right middle and lower lobes, possibly mildly improved from the prior. While this may reflect severe infection/pneumonia, given persistence, primary bronchogenic neoplasm is not excluded. Bronchoscopy is suggested.   New multifocal patchy/nodular opacities in the lungs bilaterally, possibly reflecting multifocal infection versus metastatic disease. Associated mediastinal lymphadenopathy, including an 18 mm short axis subcarinal node.   Small right pleural effusion, unchanged.  PFT: No flowsheet data found.  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   He is to continue Bactrim DS 2 tabs twice daily.  We will taper his prednisone to 10mg  daily for 2 weeks then down to 5mg  daily for 2 weeks then off.   He is following with ID and will discuss with them about the duration of bactrim therapy.   Follow-up in 4 weeks.  Freda Jackson, MD [facility] Office: [phone]   Current Outpatient Medications:    acetaminophen (TYLENOL) 500 MG tablet, Take 1,000 mg by mouth every 6 (six) hours as needed for moderate pain or fever., Disp: , Rfl:    cholecalciferol (VITAMIN D) 25 MCG (1000 UNIT) tablet, Take 1,000 Units by mouth daily., Disp: , Rfl:    Lactobacillus (PROBIOTIC ACIDOPHILUS) CAPS, Take 1 capsule by mouth daily., Disp: , Rfl:    Misc Natural Products (PROSTATE) CAPS, Take 1 capsule by mouth in the morning and at bedtime. supplement, Disp: , Rfl:    Multiple Vitamins-Minerals (EYE VITAMINS PO), Take 1 tablet by mouth daily. supplement, Disp: , Rfl:    predniSONE (DELTASONE) 20 MG tablet, Take 20 mg by mouth daily with breakfast., Disp: , Rfl:    sulfamethoxazole-trimethoprim (BACTRIM DS) 800-160 MG tablet, Take 2 tablets by mouth 2 (two) times daily., Disp: 120 tablet, Rfl: 3

## 2021-03-01 NOTE — Patient Instructions (Addendum)
Continue taking Bactrim 2 tabs twice daily   We will follow up with the Infectious Disease doctor regarding length of antibiotic treatment.   Continue prednisone taper: 10mg  daily (half a tab daily) for 2 weeks, then taper down to 5mg  daily (1 tab daily) for 2 weeks. Then off.   Follow up in 1 month   Happy New Year!

## 2021-03-03 ENCOUNTER — Encounter: Payer: Self-pay | Admitting: Pulmonary Disease

## 2021-03-15 ENCOUNTER — Other Ambulatory Visit: Payer: Self-pay

## 2021-03-15 ENCOUNTER — Encounter: Payer: Self-pay | Admitting: Internal Medicine

## 2021-03-15 ENCOUNTER — Ambulatory Visit: Payer: Medicare Other | Admitting: Internal Medicine

## 2021-03-15 DIAGNOSIS — R972 Elevated prostate specific antigen [PSA]: Secondary | ICD-10-CM

## 2021-03-15 DIAGNOSIS — A430 Pulmonary nocardiosis: Secondary | ICD-10-CM

## 2021-03-15 NOTE — Assessment & Plan Note (Signed)
He is improving 6 weeks into therapy for nocardia pneumonia.  I talked to them about the uncertainty around the optimal duration of therapy but told them that nocardia infections are usually treated with several months of therapy.  He will continue trimethoprim/sulfamethoxazole and follow-up in 6 weeks.  I will recheck his creatinine today.

## 2021-03-15 NOTE — Progress Notes (Signed)
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   CHOLECALCIFEROL (VITAMIN D) 25 MCG (1000 UNIT) TABLET    Take 1,000 Units by mouth daily.   LACTOBACILLUS (PROBIOTIC ACIDOPHILUS) CAPS    Take 1 capsule by mouth daily.   MISC NATURAL PRODUCTS (PROSTATE) CAPS    Take 1 capsule by mouth in the morning and at bedtime. supplement   MULTIPLE VITAMINS-MINERALS (EYE VITAMINS PO)    Take 1 tablet by mouth daily. supplement   PREDNISONE (DELTASONE) 20 MG TABLET    Take 20 mg by mouth daily with breakfast. 1/2 tab   SULFAMETHOXAZOLE-TRIMETHOPRIM (BACTRIM DS) 800-160 MG TABLET    Take 2 tablets by mouth 2 (two) times daily.  Modified Medications   No medications on file  Discontinued Medications   No medications on file    Subjective: Mr. Tappan is in for his routine follow-up visit.  He is accompanied by his wife.  He has been on full dose trimethoprim/sulfamethoxazole since 01/27/2021 for nocardia pneumonia.  He is feeling much better.  His cough has completely resolved and he is breathing better.  He has not had any problems tolerating trimethoprim/sulfamethoxazole.  He has not had any upset stomach or diarrhea.  He has not had any fever.  He is on a tapering dose of prednisone and should be off by the end of this month.  His major concern today is the fact that his  nocturia has gotten worse.  He is now getting up about 3 times each night to pass his urine.  Urology evaluation for his enlarged prostate and elevated PSA has not been arranged yet.  Review of Systems: Review of Systems  Constitutional:  Negative for fever.  Respiratory:  Negative for cough, sputum production and shortness of breath.   Cardiovascular:  Negative for chest pain.  Gastrointestinal:  Negative for abdominal pain, diarrhea, nausea and vomiting.  Genitourinary:  Positive for frequency. Negative for dysuria.   Past Medical History:  Diagnosis Date   Hyperlipidemia     Social History   Tobacco Use   Smoking status: Former    Types: Cigarettes   Smokeless tobacco: Never  Substance Use Topics   Alcohol use: Never   Drug use: Never    Family History  Problem Relation Age of Onset   Cancer Mother    Heart disease Father    Heart failure Father    Heart murmur Father    Hypertension Sister     Allergies  Allergen Reactions   Asa [Aspirin] Nausea And Vomiting    Objective: Vitals:   03/15/21 0952  BP: 128/74  Pulse: 89  Temp: 98.4 F (36.9 C)  TempSrc: Temporal  SpO2: 96%  Weight: 166 lb (75.3 kg)   Body mass index is 25.24  kg/m.  Physical Exam Constitutional:      Comments: His spirits are good.  Cardiovascular:     Rate and Rhythm: Normal rate and regular rhythm.     Heart sounds: No murmur heard. Pulmonary:     Effort: Pulmonary effort is normal.     Breath sounds: No wheezing, rhonchi or rales.     Comments: He has diminished breath sounds in the right base posteriorly. Psychiatric:        Mood and Affect: Mood normal.    Lab Results CMP     Component Value Date/Time   NA 137 02/23/2021 1043   K 4.4 02/23/2021 1043   CL 101 02/23/2021 1043   CO2 26 02/23/2021 1043   GLUCOSE 89 02/23/2021 1043   BUN 15 02/23/2021 1043   CREATININE 1.09 02/23/2021 1043   CALCIUM 9.3 02/23/2021 1043   PROT 7.4 02/01/2021 1412   ALBUMIN 3.6  02/01/2021 1412   AST 15 02/01/2021 1412   ALT 24 02/01/2021 1412   ALKPHOS 76 02/01/2021 1412   BILITOT 0.5 02/01/2021 1412   GFRNONAA >60 01/19/2021 0826    X-ray 02/23/2021  IMPRESSION: Improved aeration with diminished RIGHT lower lobe airspace disease and resolution of pleural fluid though still with persistent RIGHT basilar consolidation.   Blunting of RIGHT costodiaphragmatic sulcus, no visible effusion on lateral view may reflect scarring, attention on follow-up.   Problem List Items Addressed This Visit       High   Nocardial pneumonia ([place])    He is improving 6 weeks into therapy for nocardia pneumonia.  I talked to them about the uncertainty around the optimal duration of therapy but told them that nocardia infections are usually treated with several months of therapy.  He will continue trimethoprim/sulfamethoxazole and follow-up in 6 weeks.  I will recheck his creatinine today.      Relevant Orders   Basic metabolic panel   CBC     Medium    Elevated PSA    I recommend that he go ahead and have a urology evaluation scheduled as soon as possible.  He is in agreement with that plan.  I repeated his PSA today at his request.      Relevant Orders   PSA     Michel Bickers, MD [facility] [place] [phone] pager   [phone] cell 03/15/2021, 10:23 AM

## 2021-03-15 NOTE — Assessment & Plan Note (Signed)
I recommend that he go ahead and have a urology evaluation scheduled as soon as possible.  He is in agreement with that plan.  I repeated his PSA today at his request.

## 2021-03-16 ENCOUNTER — Ambulatory Visit: Payer: Medicare Other | Admitting: Internal Medicine

## 2021-03-16 LAB — BASIC METABOLIC PANEL
BUN: 17 mg/dL (ref 7–25)
CO2: 28 mmol/L (ref 20–32)
Calcium: 9.7 mg/dL (ref 8.6–10.3)
Chloride: 100 mmol/L (ref 98–110)
Creat: 1.2 mg/dL (ref 0.70–1.28)
Glucose, Bld: 88 mg/dL (ref 65–99)
Potassium: 4.6 mmol/L (ref 3.5–5.3)
Sodium: 137 mmol/L (ref 135–146)

## 2021-03-16 LAB — CBC
HCT: 35.1 % — ABNORMAL LOW (ref 38.5–50.0)
Hemoglobin: 11.4 g/dL — ABNORMAL LOW (ref 13.2–17.1)
MCH: 29.2 pg (ref 27.0–33.0)
MCHC: 32.5 g/dL (ref 32.0–36.0)
MCV: 89.8 fL (ref 80.0–100.0)
MPV: 9.1 fL (ref 7.5–12.5)
Platelets: 315 10*3/uL (ref 140–400)
RBC: 3.91 10*6/uL — ABNORMAL LOW (ref 4.20–5.80)
RDW: 16.9 % — ABNORMAL HIGH (ref 11.0–15.0)
WBC: 17.1 10*3/uL — ABNORMAL HIGH (ref 3.8–10.8)

## 2021-03-16 LAB — PSA: PSA: 10.91 ng/mL — ABNORMAL HIGH (ref <=4.00)

## 2021-03-20 ENCOUNTER — Encounter: Payer: Self-pay | Admitting: Pulmonary Disease

## 2021-03-21 NOTE — Telephone Encounter (Signed)
Dr. Dewald, please see mychart message from pt and advise. °

## 2021-03-22 MED ORDER — PREDNISONE 5 MG PO TABS: 5.0000 mg | ORAL_TABLET | Freq: Every day | ORAL | 0 refills | Status: DC

## 2021-03-25 DIAGNOSIS — J9601 Acute respiratory failure with hypoxia: Secondary | ICD-10-CM | POA: Diagnosis not present

## 2021-03-27 ENCOUNTER — Encounter: Payer: Self-pay | Admitting: Internal Medicine

## 2021-03-28 DIAGNOSIS — J90 Pleural effusion, not elsewhere classified: Secondary | ICD-10-CM | POA: Diagnosis not present

## 2021-03-28 DIAGNOSIS — R7303 Prediabetes: Secondary | ICD-10-CM | POA: Diagnosis not present

## 2021-04-04 ENCOUNTER — Other Ambulatory Visit: Payer: Self-pay

## 2021-04-04 ENCOUNTER — Ambulatory Visit (INDEPENDENT_AMBULATORY_CARE_PROVIDER_SITE_OTHER): Payer: Medicare Other

## 2021-04-04 ENCOUNTER — Encounter: Payer: Self-pay | Admitting: Pulmonary Disease

## 2021-04-04 ENCOUNTER — Ambulatory Visit: Payer: Medicare Other | Admitting: Pulmonary Disease

## 2021-04-04 VITALS — BP 124/82 | HR 77 | Ht 68.0 in | Wt 170.2 lb

## 2021-04-04 DIAGNOSIS — A430 Pulmonary nocardiosis: Secondary | ICD-10-CM | POA: Diagnosis not present

## 2021-04-04 DIAGNOSIS — J8489 Other specified interstitial pulmonary diseases: Secondary | ICD-10-CM

## 2021-04-04 NOTE — Patient Instructions (Addendum)
We will schedule you for CT Chest scan and call you with the results.   I will talk with the infectious disease doctor after the scan is done.   Continue bactrim therapy per Infectious disease

## 2021-04-04 NOTE — Progress Notes (Signed)
Synopsis: Referred in November 2022 for Hospital Follow Up of Nocardia Pneumonia with Organizing pneumonia features  Subjective:   PATIENT ID: Brett Bean GENDER: male DOB: [birth year], MRN: [id number]  HPI  Chief Complaint  Patient presents with   Follow-up    4wk f/u. States he has been doing well since last visit.    Brett Bean is a 76 year old male, remote former smoker who comes to pulmonary clinic for follow up for nocardia pneumonia with organizing features.   He continues to do well. He has been tapered down to 5mg  of prednisone daily which will end in 4 days. He denies any respiratory symptoms or fevers, chills or sweats at this time. He remains on bactrim therapy per ID.   Chest x-ray today shows persistent right basilar opacity with stable mild elevation of the right hemidiaphragm and mild blunting of the right costphrenic angle. Not much improved from 02/23/21 x-ray.  OV 03/01/21 Patient has done well since last visit and continues on bactrim therapy for the nocardia and prednisone for the organizing pneumonia. He is down to 20mg  of prednisone daily. He was evaluated by infectious disease on 02/23/21 with repeat chest radiograph which showed improvement in the right pleural effusion and right lower lobe mass.   Patient denies night sweats or shortness of breath. He continues to use oxygen at night when sleeping.   OV 02/01/21 Patient was admitted 10/13 to 10/16 for pneumonia and discharged home on extended course of Augmentin. He was re-admitted 11/11 to 11/18 with progressive symptoms and new oxygen requirement. There was not much change in the 10.2cm bilobed mass like opacity of the right middle and lower lobe with new multifocal patchy/nodular opacities in the lungs bilaterally.  Patient had CT-guided biopsy of the right lower lobe mass lesion via interventional radiology.  Pathology was notable for acute and organizing pneumonia with GMS staining showing presence of  filamentous organisms consistent with nocardia.  Patient was started on high-dose steroid treatment for the organizing pneumonia on 01/18/2021.  Bactrim DS 2 tabs twice daily was then started on 01/27/2021 when pathology staining showed evidence of Nocardia.  Patient reports that his breathing is much better since hospitalization.  He complains of urinary frequency throughout the day and night.  He denies any fevers, chills or night sweats.  His daughter and wife are with him today.  Past Medical History:  Diagnosis Date   Hyperlipidemia      Family History  Problem Relation Age of Onset   Cancer Mother    Heart disease Father    Heart failure Father    Heart murmur Father    Hypertension Sister      Social History   Socioeconomic History   Marital status: Married    Spouse name: Not on file   Number of children: Not on file   Years of education: Not on file   Highest education level: Not on file  Occupational History   Not on file  Tobacco Use   Smoking status: Former    Types: Cigarettes   Smokeless tobacco: Never  Substance and Sexual Activity   Alcohol use: Never   Drug use: Never   Sexual activity: Not on file  Other Topics Concern   Not on file  Social History Narrative   Not on file   Social Determinants of Health   Financial Resource Strain: Not on file  Food Insecurity: Not on file  Transportation Needs: Not on file  Physical Activity:  Not on file  Stress: Not on file  Social Connections: Not on file  Intimate Partner Violence: Not on file     Allergies  Allergen Reactions   Asa [Aspirin] Nausea And Vomiting     Outpatient Medications Prior to Visit  Medication Sig Dispense Refill   cholecalciferol (VITAMIN D) 25 MCG (1000 UNIT) tablet Take 1,000 Units by mouth daily.     Lactobacillus (PROBIOTIC ACIDOPHILUS) CAPS Take 1 capsule by mouth daily.     Misc Natural Products (PROSTATE) CAPS Take 1 capsule by mouth in the morning and at bedtime.  supplement     Multiple Vitamins-Minerals (EYE VITAMINS PO) Take 1 tablet by mouth daily. supplement     predniSONE (DELTASONE) 5 MG tablet Take 1 tablet (5 mg total) by mouth daily with breakfast. 14 tablet 0   sulfamethoxazole-trimethoprim (BACTRIM DS) 800-160 MG tablet Take 2 tablets by mouth 2 (two) times daily. 120 tablet 3   acetaminophen (TYLENOL) 500 MG tablet Take 1,000 mg by mouth every 6 (six) hours as needed for moderate pain or fever. (Patient not taking: Reported on 03/15/2021)     No facility-administered medications prior to visit.   Review of Systems  Constitutional:  Negative for chills, fever, malaise/fatigue and weight loss.  HENT:  Negative for congestion, sinus pain and sore throat.   Eyes: Negative.   Respiratory:  Negative for cough, hemoptysis, sputum production, shortness of breath and wheezing.   Cardiovascular:  Negative for chest pain, palpitations, orthopnea, claudication and leg swelling.  Gastrointestinal:  Negative for abdominal pain, heartburn, nausea and vomiting.  Genitourinary:  Negative for frequency and hematuria.  Musculoskeletal:  Negative for joint pain and myalgias.  Skin:  Negative for rash.  Neurological:  Negative for weakness.  Endo/Heme/Allergies: Negative.  Does not bruise/bleed easily.  Psychiatric/Behavioral: Negative.     Objective:   Vitals:   04/04/21 1032  BP: 124/82  Pulse: 77  SpO2: 98%  Weight: 170 lb 3.2 oz (77.2 kg)  Height: 5\' 8"  (1.727 m)    Physical Exam Constitutional:      General: He is not in acute distress. HENT:     Head: Normocephalic and atraumatic.  Eyes:     Conjunctiva/sclera: Conjunctivae normal.  Cardiovascular:     Rate and Rhythm: Normal rate and regular rhythm.     Pulses: Normal pulses.     Heart sounds: Normal heart sounds. No murmur heard. Pulmonary:     Effort: Pulmonary effort is normal.     Breath sounds: Examination of the right-lower field reveals decreased breath sounds. Decreased  breath sounds present. No wheezing, rhonchi or rales.  Musculoskeletal:     Right lower leg: No edema.     Left lower leg: No edema.  Skin:    General: Skin is warm and dry.  Neurological:     General: No focal deficit present.     Mental Status: He is alert.  Psychiatric:        Mood and Affect: Mood normal.        Behavior: Behavior normal.        Thought Content: Thought content normal.        Judgment: Judgment normal.   CBC    Component Value Date/Time   WBC 17.1 (H) 03/15/2021 1020   RBC 3.91 (L) 03/15/2021 1020   HGB 11.4 (L) 03/15/2021 1020   HCT 35.1 (L) 03/15/2021 1020   PLT 315 03/15/2021 1020   MCV 89.8 03/15/2021 1020   MCH 29.2  03/15/2021 1020   MCHC 32.5 03/15/2021 1020   RDW 16.9 (H) 03/15/2021 1020   LYMPHSABS 0.6 (L) 02/01/2021 1412   MONOABS 0.6 02/01/2021 1412   EOSABS 0.0 02/01/2021 1412   BASOSABS 0.0 02/01/2021 1412   BMP Latest Ref Rng & Units 03/15/2021 02/23/2021 02/07/2021  Glucose 65 - 99 mg/dL 88 89 84  BUN 7 - 25 mg/dL 17 15 20   Creatinine 0.70 - 1.28 mg/dL 1.20 1.09 1.18  BUN/Creat Ratio 6 - 22 (calc) NOT APPLICABLE NOT APPLICABLE -  Sodium 712 - 146 mmol/L 137 137 130(L)  Potassium 3.5 - 5.3 mmol/L 4.6 4.4 4.3  Chloride 98 - 110 mmol/L 100 101 95(L)  CO2 20 - 32 mmol/L 28 26 26   Calcium 8.6 - 10.3 mg/dL 9.7 9.3 9.6    Chest imaging: CXR 04/04/20 Unchanged residual right basilar pulmonary opacity which may reflect residual inflammation or postinflammatory scarring. No new findings.  CXR 02/23/21 Cardiomediastinal contours and hilar structures are stable. Lung show improved aeration. Mild blunting of RIGHT costodiaphragmatic sulcus with persistent RIGHT lower lobe airspace disease partially obscuring the RIGHT hemidiaphragmatic contour.   LEFT chest is clear.  No visible pneumothorax.   On limited assessment there is no acute skeletal process.  CT Chest 01/19/21 10.2 cm bilobed masslike opacity along the inferior right middle and  lower lobes, possibly mildly improved from the prior. While this may reflect severe infection/pneumonia, given persistence, primary bronchogenic neoplasm is not excluded. Bronchoscopy is suggested.   New multifocal patchy/nodular opacities in the lungs bilaterally, possibly reflecting multifocal infection versus metastatic disease. Associated mediastinal lymphadenopathy, including an 18 mm short axis subcarinal node.   Small right pleural effusion, unchanged.  PFT: No flowsheet data found.  Labs:  Path 01/18/21: RLL Biopsy Acute and organizing pneumonia GMS stain + for filamentous organisms, consistent with Nocardia  Echo:  Heart Catheterization:  Assessment & Plan:   Nocardial pneumonia ([place]) - Plan: Pulse oximetry, overnight, CT Chest Wo Contrast  Organizing pneumonia ([place]) - Plan: Pulse oximetry, overnight, CT Chest Wo Contrast  Discussion: Jerimyah Vandunk is a 76 year old male, remote former smoker who comes to pulmonary clinic for follow up for nocardia pneumonia with organizing features.   He is to continue Bactrim DS 2 tabs twice daily per ID.  He is completing a steroid taper later this week. I am concerned that the chest radiograph does not appear much different than last month and will evaluate further with a CT Chest scan to determine if we should return to prolonged high dose steroid taper for the organizing pneumonia component. Will discuss his case at the ILD conference next month.  Follow-up in 2 months.  Freda Jackson, MD [facility] Office: [phone]   Current Outpatient Medications:    cholecalciferol (VITAMIN D) 25 MCG (1000 UNIT) tablet, Take 1,000 Units by mouth daily., Disp: , Rfl:    Lactobacillus (PROBIOTIC ACIDOPHILUS) CAPS, Take 1 capsule by mouth daily., Disp: , Rfl:    Misc Natural Products (PROSTATE) CAPS, Take 1 capsule by mouth in the morning and at bedtime. supplement, Disp: , Rfl:    Multiple Vitamins-Minerals (EYE  VITAMINS PO), Take 1 tablet by mouth daily. supplement, Disp: , Rfl:    predniSONE (DELTASONE) 5 MG tablet, Take 1 tablet (5 mg total) by mouth daily with breakfast., Disp: 14 tablet, Rfl: 0   sulfamethoxazole-trimethoprim (BACTRIM DS) 800-160 MG tablet, Take 2 tablets by mouth 2 (two) times daily., Disp: 120 tablet, Rfl: 3

## 2021-04-19 ENCOUNTER — Other Ambulatory Visit: Payer: Self-pay

## 2021-04-19 ENCOUNTER — Ambulatory Visit (HOSPITAL_COMMUNITY)
Admission: RE | Admit: 2021-04-19 | Discharge: 2021-04-19 | Disposition: A | Discharge status: Home / Self Care | Payer: Medicare Other | Source: Ambulatory Visit | Attending: Pulmonary Disease | Admitting: Pulmonary Disease

## 2021-04-19 DIAGNOSIS — A430 Pulmonary nocardiosis: Secondary | ICD-10-CM | POA: Insufficient documentation

## 2021-04-19 DIAGNOSIS — J8489 Other specified interstitial pulmonary diseases: Secondary | ICD-10-CM | POA: Diagnosis not present

## 2021-04-19 DIAGNOSIS — I700 Atherosclerosis of aorta: Secondary | ICD-10-CM | POA: Diagnosis not present

## 2021-04-19 DIAGNOSIS — R911 Solitary pulmonary nodule: Secondary | ICD-10-CM | POA: Diagnosis not present

## 2021-04-20 ENCOUNTER — Encounter: Payer: Self-pay | Admitting: Pulmonary Disease

## 2021-04-20 DIAGNOSIS — G4730 Sleep apnea, unspecified: Secondary | ICD-10-CM | POA: Diagnosis not present

## 2021-04-20 DIAGNOSIS — R0683 Snoring: Secondary | ICD-10-CM | POA: Diagnosis not present

## 2021-04-25 DIAGNOSIS — J9601 Acute respiratory failure with hypoxia: Secondary | ICD-10-CM | POA: Diagnosis not present

## 2021-04-26 ENCOUNTER — Other Ambulatory Visit: Payer: Self-pay

## 2021-04-26 ENCOUNTER — Ambulatory Visit: Payer: Medicare Other | Admitting: Internal Medicine

## 2021-04-26 ENCOUNTER — Encounter: Payer: Self-pay | Admitting: Internal Medicine

## 2021-04-26 DIAGNOSIS — A430 Pulmonary nocardiosis: Secondary | ICD-10-CM | POA: Diagnosis not present

## 2021-04-26 LAB — BASIC METABOLIC PANEL WITH GFR
BUN/Creatinine Ratio: 11 (calc) (ref 6–22)
BUN: 14 mg/dL (ref 7–25)
CO2: 27 mmol/L (ref 20–32)
Calcium: 9.6 mg/dL (ref 8.6–10.3)
Chloride: 103 mmol/L (ref 98–110)
Creat: 1.31 mg/dL — ABNORMAL HIGH (ref 0.70–1.28)
Glucose, Bld: 86 mg/dL (ref 65–99)
Potassium: 4.4 mmol/L (ref 3.5–5.3)
Sodium: 137 mmol/L (ref 135–146)

## 2021-04-26 LAB — CBC
HCT: 36.5 % — ABNORMAL LOW (ref 38.5–50.0)
Hemoglobin: 12 g/dL — ABNORMAL LOW (ref 13.2–17.1)
MCH: 30.3 pg (ref 27.0–33.0)
MCHC: 32.9 g/dL (ref 32.0–36.0)
MCV: 92.2 fL (ref 80.0–100.0)
MPV: 9.5 fL (ref 7.5–12.5)
Platelets: 296 Thousand/uL (ref 140–400)
RBC: 3.96 Million/uL — ABNORMAL LOW (ref 4.20–5.80)
RDW: 13.3 % (ref 11.0–15.0)
WBC: 7.7 Thousand/uL (ref 3.8–10.8)

## 2021-04-26 NOTE — Progress Notes (Addendum)
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   LACTOBACILLUS (PROBIOTIC ACIDOPHILUS) CAPS    Take 1 capsule by mouth daily.   MISC NATURAL PRODUCTS (PROSTATE) CAPS    Take 1 capsule by mouth in the morning and at bedtime. supplement   MULTIPLE VITAMINS-MINERALS (EYE VITAMINS PO)    Take 1 tablet by mouth daily. supplement   NON FORMULARY    Cromium   PREDNISONE (DELTASONE) 5 MG TABLET    Take 1 tablet (5 mg total) by mouth daily with breakfast.   SULFAMETHOXAZOLE-TRIMETHOPRIM (BACTRIM DS) 800-160 MG TABLET    Take 2 tablets by mouth 2 (two) times daily.  Modified Medications   No medications on file  Discontinued Medications   No medications on file    Subjective: Brett Bean is in for his routine follow-up visit.  He is accompanied by his wife.  He has been on full dose trimethoprim/sulfamethoxazole since 01/27/2021 for nocardia pneumonia.  He is feeling much better.  His cough has completely resolved and he does not have any unusual shortness of breath..  He has not had any problems tolerating trimethoprim/sulfamethoxazole.  He has not had any upset stomach or diarrhea.  He has not had any fever.  He tapered off of prednisone last month.  He underwent a repeat chest CT on 04/19/2021 which showed:  IMPRESSION: 1. Interval decrease in the size of right lung base consolidation. Continued  follow-up to resolution recommended. 2. Subcarinal adenopathy. 3. Indeterminate ill-defined 2 cm rounded exophytic lesion from the superior pole of the left kidney. This can be further valuated with renal ultrasound. 4. Similar appearance of a 7 mm nodule in the anterior lingula. 5. Aortic Atherosclerosis (ICD10-I70.0).  Review of Systems: Review of Systems  Constitutional:  Negative for chills, diaphoresis, fever and weight loss.  Respiratory:  Negative for cough, sputum production and shortness of breath.   Gastrointestinal:  Negative for abdominal pain, diarrhea, nausea and vomiting.   Past Medical History:  Diagnosis Date   Hyperlipidemia     Social History   Tobacco Use   Smoking status: Former    Types: Cigarettes   Smokeless tobacco: Never  Substance Use Topics   Alcohol use: Never   Drug use: Never    Family History  Problem Relation Age of Onset   Cancer Mother    Heart disease Father    Heart failure Father    Heart murmur Father    Hypertension Sister     Allergies  Allergen Reactions   Asa [Aspirin] Nausea And Vomiting    Objective: Vitals:   04/26/21 0944  BP: 130/75  Pulse: 77  Temp: 98.4 F (36.9 C)  TempSrc: Oral  SpO2: 97%  Weight: 173 lb (78.5 kg)  Height: 5\' 8"  (1.727 m)   Body mass index is 26.3 kg/m.  Physical Exam Constitutional:      Comments: He is very pleasant as usual.  Cardiovascular:     Rate and Rhythm: Normal rate and regular rhythm.     Heart sounds: No murmur heard. Pulmonary:     Effort: Pulmonary effort is normal.     Breath sounds: No wheezing, rhonchi or rales.     Comments: Slightly diminished breath sounds in the right base posteriorly. Psychiatric:        Mood and Affect: Mood normal.    Lab Results CMP     Component Value Date/Time   NA 137 03/15/2021 1020   K 4.6 03/15/2021 1020   CL 100 03/15/2021 1020   CO2 28 03/15/2021 1020   GLUCOSE 88 03/15/2021 1020   BUN 17 03/15/2021 1020   CREATININE  1.20 03/15/2021 1020   CALCIUM 9.7 03/15/2021 1020   PROT 7.4 02/01/2021 1412   ALBUMIN 3.6 02/01/2021 1412   AST 15 02/01/2021 1412   ALT 24 02/01/2021 1412   ALKPHOS 76 02/01/2021 1412   BILITOT 0.5 02/01/2021 1412   GFRNONAA >60 01/19/2021 0826    X-ray 02/23/2021  IMPRESSION: Improved aeration with diminished RIGHT lower lobe airspace disease and resolution of pleural fluid though still with persistent RIGHT basilar consolidation.   Blunting of RIGHT costodiaphragmatic sulcus, no visible effusion on lateral view may reflect scarring, attention on follow-up.   Problem List Items Addressed This Visit       High   Nocardial pneumonia ([place])    He is responding well to antibiotic therapy for moderately severe nocardial pneumonia.  I talked to them again about the stubborn nature this type of infection.  His CT scan shows improvement but not complete resolution of his right lung base infiltrate.  I told him that I would recommend continuing trimethoprim/sulfamethoxazole for at least 3 more months.  They are in agreement with that plan.  He will follow-up here in 6 weeks to check blood work today.      Relevant Orders   Basic metabolic panel   CBC    Michel Bickers, MD [facility] [place] [phone] pager   [phone] cell 04/26/2021, 10:05 AM

## 2021-04-26 NOTE — Assessment & Plan Note (Signed)
He is responding well to antibiotic therapy for moderately severe nocardial pneumonia.  I talked to them again about the stubborn nature this type of infection.  His CT scan shows improvement but not complete resolution of his right lung base infiltrate.  I told him that I would recommend continuing trimethoprim/sulfamethoxazole for at least 3 more months.  They are in agreement with that plan.  He will follow-up here in 6 weeks to check blood work today.

## 2021-05-16 ENCOUNTER — Ambulatory Visit: Payer: Self-pay | Admitting: Pulmonary Disease

## 2021-05-16 DIAGNOSIS — J8489 Other specified interstitial pulmonary diseases: Secondary | ICD-10-CM | POA: Diagnosis not present

## 2021-05-23 DIAGNOSIS — J9601 Acute respiratory failure with hypoxia: Secondary | ICD-10-CM | POA: Diagnosis not present

## 2021-05-23 NOTE — Progress Notes (Addendum)
? ?  Interstitial Lung Disease Multidisciplinary Conference ?  BALLARD BUDNEY    MRN [id number]    DOB July 01, 1945 ? ?Primary Care Physician:Hill, Berneta Sages, MD ? ?Referring Physician: Madie Reno MD ? ?Time of Conference: 7.30am- 8.30am ?Date of conference: 05/16/2021 ?Location of Conference: -  Virtual ? ?Participating ?Pulmonary: Dr. Brand Males, MD,  Dr Marshell Garfinkel, MD  ?Pathology: Dr Jaquita Folds, MD ?Radiology: Dr Yetta Glassman MD ? ?Brief History:  ?76 year old male, remote former smoker with nocardia pneumonia with organizing features. He was initially started on high dose steroids for organizing pneumonia until pathology stains indicated nocardia involvement. The steroids have been tapered down and he remains on Bactrim therapy per ID for the nocardia. Recent imaging shows no major improvement in his RLL opacity. Question is to return to prolonged high dose steroid taper or not?  ? ?Serology:  ? ?MDD discussion of CT scan   ?CT chest 01/13/21-masslike opacity along the inferior right middle and lower lobes, new patchy multilobular opacity bilaterally ? ?CT scan 04/19/21-decrease in right lung base consolidation.  Overall appearance looks like resolving inflammation. ? ?Pathology discussion of biopsy: ?Lung biopsy shows loose fibroblastic plugs with immature fibrosis and organizing fibrinous pneumonia.  Also noted the presence of fibrin cell plugs with neutrophils.  GMS stains show filamentous branching organisms consistent with nocardia ? ? ?MDD Impression/Recs:  ?Clinical presentation and biopsies are consistent with acute nocardia pneumonia with right lower lobe consolidation.  He has responded well to treatment with improved clinical findings.  There are residual findings of right lung base consolidation which is improved.  Findings are consistent with resolving severe infection. ? ?Recommendations are to continue imaging follow-up to resolution.  No need for additional steroids ? ?Time Spent  in preparation and discussion:  > 30 min ? ?SIGNATURE  ? ?Marshell Garfinkel MD ?[facility] ?See Amion for pager ? ?If no response to pager , please call [phone] until 7pm ?After 7:00 pm call Elink  [phone] ?05/23/2021, 10:09 AM  ? ?

## 2021-05-26 ENCOUNTER — Telehealth: Payer: Self-pay | Admitting: Pulmonary Disease

## 2021-05-26 NOTE — Telephone Encounter (Signed)
ONO Results ? ?Please let patient know that he should continue to wear supplemental oxygen at night using 2 L.  He spent 45 minutes with an oxygen saturation less than 88% based on his overnight oximetry study. ? ?Thanks, ?JD ?

## 2021-05-29 NOTE — Telephone Encounter (Signed)
Called and spoke with patient. He verbalized understanding of results. Nothing further needed at time of call.  

## 2021-05-30 DIAGNOSIS — A430 Pulmonary nocardiosis: Secondary | ICD-10-CM | POA: Diagnosis not present

## 2021-05-30 DIAGNOSIS — R7303 Prediabetes: Secondary | ICD-10-CM | POA: Diagnosis not present

## 2021-06-06 ENCOUNTER — Ambulatory Visit: Payer: Medicare Other | Admitting: Pulmonary Disease

## 2021-06-06 ENCOUNTER — Encounter: Payer: Self-pay | Admitting: Pulmonary Disease

## 2021-06-06 VITALS — BP 122/74 | HR 67 | Temp 97.9°F | Ht 68.0 in | Wt 176.2 lb

## 2021-06-06 DIAGNOSIS — A430 Pulmonary nocardiosis: Secondary | ICD-10-CM | POA: Diagnosis not present

## 2021-06-06 DIAGNOSIS — G4734 Idiopathic sleep related nonobstructive alveolar hypoventilation: Secondary | ICD-10-CM

## 2021-06-06 DIAGNOSIS — J8489 Other specified interstitial pulmonary diseases: Secondary | ICD-10-CM

## 2021-06-06 NOTE — Progress Notes (Signed)
? ?Synopsis: Referred in November 2022 for Hospital Follow Up of Nocardia Pneumonia with Organizing pneumonia features ? ?Subjective:  ? ?PATIENT ID: Brett Bean GENDER: male DOB: 1945/11/22, MRN: [id number] ? ?HPI ? ?Chief Complaint  ?Patient presents with  ? Follow-up  ?  3 mo f/u for PNA. States his breathing has been stable since last visit. The Bactrim or prednisone has caused a bitter, metallic taste in his mouth.   ? ?Brett Bean is a 76 year old male, remote former smoker who comes to pulmonary clinic for follow up for nocardia pneumonia with organizing features.  ? ?Patient continues to do well. He is using 2L of oxygen at night. Recent ONO study showed he spent 45 minutes with SpO2 less than 88%. He remains on bactrim treatment per infectious diseases.  ? ?He had CT Chest scan 04/19/21 which showed improvement of the right lower lobe consolidation with air bronchograms and improvement of the right upper lobe consolidation. There is subcarinal adenopathy, measuring 2.2cm. ? ?OV 04/04/21 ?He continues to do well. He has been tapered down to '5mg'$  of prednisone daily which will end in 4 days. He denies any respiratory symptoms or fevers, chills or sweats at this time. He remains on bactrim therapy per ID.  ? ?Chest x-ray today shows persistent right basilar opacity with stable mild elevation of the right hemidiaphragm and mild blunting of the right costphrenic angle. Not much improved from 02/23/21 x-ray. ? ?OV 03/01/21 ?Patient has done well since last visit and continues on bactrim therapy for the nocardia and prednisone for the organizing pneumonia. He is down to '20mg'$  of prednisone daily. He was evaluated by infectious disease on 02/23/21 with repeat chest radiograph which showed improvement in the right pleural effusion and right lower lobe mass.  ? ?Patient denies night sweats or shortness of breath. He continues to use oxygen at night when sleeping.  ? ?OV 02/01/21 ?Patient was admitted 10/13 to 10/16  for pneumonia and discharged home on extended course of Augmentin. He was re-admitted 11/11 to 11/18 with progressive symptoms and new oxygen requirement. There was not much change in the 10.2cm bilobed mass like opacity of the right middle and lower lobe with new multifocal patchy/nodular opacities in the lungs bilaterally.  Patient had CT-guided biopsy of the right lower lobe mass lesion via interventional radiology.  Pathology was notable for acute and organizing pneumonia with GMS staining showing presence of filamentous organisms consistent with nocardia. ? ?Patient was started on high-dose steroid treatment for the organizing pneumonia on 01/18/2021.  Bactrim DS 2 tabs twice daily was then started on 01/27/2021 when pathology staining showed evidence of Nocardia. ? ?Patient reports that his breathing is much better since hospitalization.  He complains of urinary frequency throughout the day and night.  He denies any fevers, chills or night sweats. ? ?His daughter and wife are with him today. ? ?Past Medical History:  ?Diagnosis Date  ? Hyperlipidemia   ?  ? ?Family History  ?Problem Relation Age of Onset  ? Cancer Mother   ? Heart disease Father   ? Heart failure Father   ? Heart murmur Father   ? Hypertension Sister   ?  ? ?Social History  ? ?Socioeconomic History  ? Marital status: Married  ?  Spouse name: Not on file  ? Number of children: Not on file  ? Years of education: Not on file  ? Highest education level: Not on file  ?Occupational History  ? Not on file  ?  Tobacco Use  ? Smoking status: Former  ?  Types: Cigarettes  ? Smokeless tobacco: Never  ?Substance and Sexual Activity  ? Alcohol use: Never  ? Drug use: Never  ? Sexual activity: Not on file  ?Other Topics Concern  ? Not on file  ?Social History Narrative  ? Not on file  ? ?Social Determinants of Health  ? ?Financial Resource Strain: Not on file  ?Food Insecurity: Not on file  ?Transportation Needs: Not on file  ?Physical Activity: Not on file   ?Stress: Not on file  ?Social Connections: Not on file  ?Intimate Partner Violence: Not on file  ?  ? ?Allergies  ?Allergen Reactions  ? Asa [Aspirin] Nausea And Vomiting  ?  ? ?Outpatient Medications Prior to Visit  ?Medication Sig Dispense Refill  ? cholecalciferol (VITAMIN D) 25 MCG (1000 UNIT) tablet Take 1,000 Units by mouth daily.    ? Lactobacillus (PROBIOTIC ACIDOPHILUS) CAPS Take 1 capsule by mouth daily.    ? Misc Natural Products (PROSTATE) CAPS Take 1 capsule by mouth in the morning and at bedtime. supplement    ? Multiple Vitamins-Minerals (EYE VITAMINS PO) Take 1 tablet by mouth daily. supplement    ? NON FORMULARY Cromium    ? sulfamethoxazole-trimethoprim (BACTRIM DS) 800-160 MG tablet Take 2 tablets by mouth 2 (two) times daily. 120 tablet 3  ? predniSONE (DELTASONE) 5 MG tablet Take 1 tablet (5 mg total) by mouth daily with breakfast. (Patient not taking: Reported on 04/26/2021) 14 tablet 0  ? ?No facility-administered medications prior to visit.  ? ?Review of Systems  ?Constitutional:  Negative for chills, fever, malaise/fatigue and weight loss.  ?HENT:  Negative for congestion, sinus pain and sore throat.   ?Eyes: Negative.   ?Respiratory:  Negative for cough, hemoptysis, sputum production, shortness of breath and wheezing.   ?Cardiovascular:  Negative for chest pain, palpitations, orthopnea, claudication and leg swelling.  ?Gastrointestinal:  Negative for abdominal pain, heartburn, nausea and vomiting.  ?Genitourinary:  Negative for frequency and hematuria.  ?Musculoskeletal:  Negative for joint pain and myalgias.  ?Skin:  Negative for rash.  ?Neurological:  Negative for weakness.  ?Endo/Heme/Allergies: Negative.  Does not bruise/bleed easily.  ?Psychiatric/Behavioral: Negative.    ? ?Objective:  ? ?Vitals:  ? 06/06/21 1020  ?BP: 122/74  ?Pulse: 67  ?Temp: 97.9 ?F (36.6 ?C)  ?TempSrc: Oral  ?SpO2: 95%  ?Weight: 176 lb 3.2 oz (79.9 kg)  ?Height: '5\' 8"'$  (1.727 m)  ? ? ?Physical  Exam ?Constitutional:   ?   General: He is not in acute distress. ?HENT:  ?   Head: Normocephalic and atraumatic.  ?Eyes:  ?   Conjunctiva/sclera: Conjunctivae normal.  ?Cardiovascular:  ?   Rate and Rhythm: Normal rate and regular rhythm.  ?   Pulses: Normal pulses.  ?   Heart sounds: Normal heart sounds. No murmur heard. ?Pulmonary:  ?   Effort: Pulmonary effort is normal.  ?   Breath sounds: No decreased breath sounds, wheezing, rhonchi or rales.  ?Musculoskeletal:  ?   Right lower leg: No edema.  ?   Left lower leg: No edema.  ?Skin: ?   General: Skin is warm and dry.  ?Neurological:  ?   General: No focal deficit present.  ?   Mental Status: He is alert.  ?Psychiatric:     ?   Mood and Affect: Mood normal.     ?   Behavior: Behavior normal.     ?   Thought  Content: Thought content normal.     ?   Judgment: Judgment normal.  ? ?CBC ?   ?Component Value Date/Time  ? WBC 7.7 04/26/2021 1006  ? RBC 3.96 (L) 04/26/2021 1006  ? HGB 12.0 (L) 04/26/2021 1006  ? HCT 36.5 (L) 04/26/2021 1006  ? PLT 296 04/26/2021 1006  ? MCV 92.2 04/26/2021 1006  ? MCH 30.3 04/26/2021 1006  ? MCHC 32.9 04/26/2021 1006  ? RDW 13.3 04/26/2021 1006  ? LYMPHSABS 0.6 (L) 02/01/2021 1412  ? MONOABS 0.6 02/01/2021 1412  ? EOSABS 0.0 02/01/2021 1412  ? BASOSABS 0.0 02/01/2021 1412  ? ? ?  Latest Ref Rng & Units 04/26/2021  ? 10:06 AM 03/15/2021  ? 10:20 AM 02/23/2021  ? 10:43 AM  ?BMP  ?Glucose 65 - 99 mg/dL 86   88   89    ?BUN 7 - 25 mg/dL '14   17   15    '$ ?Creatinine 0.70 - 1.28 mg/dL 1.31   1.20   1.09    ?BUN/Creat Ratio 6 - 22 (calc) 11   NOT APPLICABLE   NOT APPLICABLE    ?Sodium 135 - 146 mmol/L 137   137   137    ?Potassium 3.5 - 5.3 mmol/L 4.4   4.6   4.4    ?Chloride 98 - 110 mmol/L 103   100   101    ?CO2 20 - 32 mmol/L '27   28   26    '$ ?Calcium 8.6 - 10.3 mg/dL 9.6   9.7   9.3    ? ? ?Chest imaging: ?CT Chest 04/19/21 ?1. Interval decrease in the size of right lung base consolidation. ?Continued follow-up to resolution recommended. ?2.  Subcarinal adenopathy. ?3. Indeterminate ill-defined 2 cm rounded exophytic lesion from the ?superior pole of the left kidney. This can be further evaluated with ?renal ultrasound. ?4. Similar appearance of a 7 mm nodule in

## 2021-06-06 NOTE — Patient Instructions (Signed)
Follow up in 6 months ? ?Your CT chest scan looks much improved ? ?Continue on antibiotic therapy per infectious disease ?

## 2021-06-07 ENCOUNTER — Encounter: Payer: Self-pay | Admitting: Internal Medicine

## 2021-06-07 ENCOUNTER — Other Ambulatory Visit: Payer: Self-pay

## 2021-06-07 ENCOUNTER — Ambulatory Visit: Payer: Medicare Other | Admitting: Internal Medicine

## 2021-06-07 DIAGNOSIS — A430 Pulmonary nocardiosis: Secondary | ICD-10-CM | POA: Diagnosis not present

## 2021-06-07 MED ORDER — SULFAMETHOXAZOLE-TRIMETHOPRIM 800-160 MG PO TABS: 2.0000 | ORAL_TABLET | Freq: Two times a day (BID) | ORAL | 3 refills | Status: DC

## 2021-06-07 NOTE — Assessment & Plan Note (Signed)
He continues to have good clinical improvement on therapy for nocardial pneumonia.  I will check repeat CBC and BMP today.  He is in agreement with continuing trimethoprim/sulfamethoxazole for 2 more months.  He will follow-up at that time. ?

## 2021-06-07 NOTE — Progress Notes (Signed)
?  ? ? ? ? ?[facility] for Infectious [facility] ? ?Patient Active Problem List  ? Diagnosis Date Noted  ? Nocardial pneumonia ([place]) 02/23/2021  ?  Priority: High  ? 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  Priority: High  ? 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  Priority: Medium   ? Enlarged prostate 02/23/2021  ?  Priority: Medium   ? Elevated PSA 02/23/2021  ?  Priority: Medium   ? Malnutrition of moderate degree 01/14/2021  ? Normochromic normocytic anemia 01/13/2021  ? ? ?Patient's Medications  ?New Prescriptions  ? No medications on file  ?Previous Medications  ? CHOLECALCIFEROL (VITAMIN D) 25 MCG (1000 UNIT) TABLET    Take 1,000 Units by mouth daily.  ? LACTOBACILLUS (PROBIOTIC ACIDOPHILUS) CAPS    Take 1 capsule by mouth daily.  ? MISC NATURAL PRODUCTS (PROSTATE) CAPS    Take 1 capsule by mouth in the morning and at bedtime. supplement  ? MULTIPLE VITAMINS-MINERALS (EYE VITAMINS PO)    Take 1 tablet by mouth daily. supplement  ? NON FORMULARY    Cromium  ?Modified Medications  ? Modified Medication Previous Medication  ? SULFAMETHOXAZOLE-TRIMETHOPRIM (BACTRIM DS) 800-160 MG TABLET sulfamethoxazole-trimethoprim (BACTRIM DS) 800-160 MG tablet  ?    Take 2 tablets by mouth 2 (two) times daily.    Take 2 tablets by mouth 2 (two) times daily.  ?Discontinued Medications  ? No medications on file  ? ? ?Subjective: ?Brett Bean is in for his routine follow-up visit.  He has been on full dose trimethoprim/sulfamethoxazole since 01/27/2021 for nocardia pneumonia.  He is feeling much better.  His cough has completely resolved and he does not have any unusual shortness of breath..  He has not had any problems tolerating trimethoprim/sulfamethoxazole.   ? ?He underwent a repeat chest CT on 04/19/2021 which showed: ? ?IMPRESSION: ?1. Interval decrease in the size of right lung base consolidation. ?Continued follow-up to resolution recommended. ?2. Subcarinal adenopathy. ?3. Indeterminate ill-defined 2 cm rounded exophytic  lesion from the superior pole of the left kidney. This can be further valuated with renal ultrasound. ?4. Similar appearance of a 7 mm nodule in the anterior lingula. ?5. Aortic Atherosclerosis (ICD10-I70.0). ? ?He says that he is feeling less "jittery".  He is walking about 10 minutes at a time.  He says that his balance is not completely normal but seems to be improving. ? ?Review of Systems: ?Review of Systems  ?Constitutional:  Negative for chills, diaphoresis, fever and weight loss.  ?Respiratory:  Negative for cough, sputum production and shortness of breath.   ?Gastrointestinal:  Negative for abdominal pain, diarrhea, nausea and vomiting.  ? ?Past Medical History:  ?Diagnosis Date  ? Hyperlipidemia   ? ? ?Social History  ? ?Tobacco Use  ? Smoking status: Former  ?  Types: Cigarettes  ? Smokeless tobacco: Never  ?Substance Use Topics  ? Alcohol use: Never  ? Drug use: Never  ? ? ?Family History  ?Problem Relation Age of Onset  ? Cancer Mother   ? Heart disease Father   ? Heart failure Father   ? Heart murmur Father   ? Hypertension Sister   ? ? ?Allergies  ?Allergen Reactions  ? Asa [Aspirin] Nausea And Vomiting  ? ? ?Objective: ?Vitals:  ? 06/07/21 0944  ?BP: 129/75  ?Pulse: 68  ?Temp: 98.1 ?F (36.7 ?C)  ?TempSrc: Oral  ?SpO2: 94%  ?Weight: 178 lb (80.7 kg)  ?Height: '5\' 8"'$  (1.727 m)  ? ?Body mass index  is 27.06 kg/m?. ? ?Physical Exam ?Constitutional:   ?   Comments: He is in good spirits.  He is accompanied by his wife.  ?Cardiovascular:  ?   Rate and Rhythm: Normal rate and regular rhythm.  ?   Heart sounds: No murmur heard. ?Pulmonary:  ?   Effort: Pulmonary effort is normal.  ?   Breath sounds: No wheezing, rhonchi or rales.  ?   Comments: He has good aeration throughout all lung fields. ?Psychiatric:     ?   Mood and Affect: Mood normal.  ? ? ?Lab Results ?CMP  ?   ?Component Value Date/Time  ? NA 137 04/26/2021 1006  ? K 4.4 04/26/2021 1006  ? CL 103 04/26/2021 1006  ? CO2 27 04/26/2021 1006  ? GLUCOSE  86 04/26/2021 1006  ? BUN 14 04/26/2021 1006  ? CREATININE 1.31 (H) 04/26/2021 1006  ? CALCIUM 9.6 04/26/2021 1006  ? PROT 7.4 02/01/2021 1412  ? ALBUMIN 3.6 02/01/2021 1412  ? AST 15 02/01/2021 1412  ? ALT 24 02/01/2021 1412  ? ALKPHOS 76 02/01/2021 1412  ? BILITOT 0.5 02/01/2021 1412  ? GFRNONAA >60 01/19/2021 0826  ?  ?X-ray 02/23/2021 ? ?IMPRESSION: ?Improved aeration with diminished RIGHT lower lobe airspace disease ?and resolution of pleural fluid though still with persistent RIGHT ?basilar consolidation. ?  ?Blunting of RIGHT costodiaphragmatic sulcus, no visible effusion on ?lateral view may reflect scarring, attention on follow-up. ?  ?Problem List Items Addressed This Visit   ? ?  ? High  ? Nocardial pneumonia ([place])  ?  He continues to have good clinical improvement on therapy for nocardial pneumonia.  I will check repeat CBC and BMP today.  He is in agreement with continuing trimethoprim/sulfamethoxazole for 2 more months.  He will follow-up at that time. ?  ?  ? Relevant Medications  ? sulfamethoxazole-trimethoprim (BACTRIM DS) 800-160 MG tablet  ? Other Relevant Orders  ? CBC  ? Basic metabolic panel  ? ?Michel Bickers, MD ?[facility] for Infectious Disease ?[place] ?[phone] pager   [phone] cell ?06/07/2021, 10:05 AM ?

## 2021-06-08 LAB — BASIC METABOLIC PANEL
BUN/Creatinine Ratio: 10 (calc) (ref 6–22)
BUN: 16 mg/dL (ref 7–25)
CO2: 27 mmol/L (ref 20–32)
Calcium: 9.5 mg/dL (ref 8.6–10.3)
Chloride: 103 mmol/L (ref 98–110)
Creat: 1.55 mg/dL — ABNORMAL HIGH (ref 0.70–1.28)
Glucose, Bld: 93 mg/dL (ref 65–99)
Potassium: 4.5 mmol/L (ref 3.5–5.3)
Sodium: 137 mmol/L (ref 135–146)

## 2021-06-08 LAB — CBC
HCT: 38.8 % (ref 38.5–50.0)
Hemoglobin: 12.8 g/dL — ABNORMAL LOW (ref 13.2–17.1)
MCH: 30.6 pg (ref 27.0–33.0)
MCHC: 33 g/dL (ref 32.0–36.0)
MCV: 92.8 fL (ref 80.0–100.0)
MPV: 10 fL (ref 7.5–12.5)
Platelets: 209 10*3/uL (ref 140–400)
RBC: 4.18 10*6/uL — ABNORMAL LOW (ref 4.20–5.80)
RDW: 12.5 % (ref 11.0–15.0)
WBC: 5.3 10*3/uL (ref 3.8–10.8)

## 2021-06-12 ENCOUNTER — Other Ambulatory Visit (HOSPITAL_COMMUNITY): Payer: Self-pay

## 2021-06-13 ENCOUNTER — Encounter: Payer: Self-pay | Admitting: Internal Medicine

## 2021-06-13 ENCOUNTER — Other Ambulatory Visit: Payer: Self-pay

## 2021-06-13 ENCOUNTER — Ambulatory Visit: Payer: Medicare Other | Admitting: Internal Medicine

## 2021-06-13 DIAGNOSIS — A430 Pulmonary nocardiosis: Secondary | ICD-10-CM

## 2021-06-13 NOTE — Assessment & Plan Note (Addendum)
I will repeat his creatinine today.  If it continues to rise will discuss changing trimethoprim/sulfamethoxazole to an alternate regimen or simply stopping antibiotic therapy soon. ? ?Addendum: His creatinine is up more to 1.61. I will switch trimethoprim-sulfamethoxazole to amoxicillin-clavulanate. ?

## 2021-06-13 NOTE — Progress Notes (Addendum)
?  ? ? ? ? ?[facility] for Infectious Disease ? ?Patient Active Problem List  ? Diagnosis Date Noted  ? Nocardial pneumonia ([place]) 02/23/2021  ?  Priority: High  ? 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  Priority: High  ? 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  Priority: Medium   ? Enlarged prostate 02/23/2021  ?  Priority: Medium   ? Elevated PSA 02/23/2021  ?  Priority: Medium   ? Malnutrition of moderate degree 01/14/2021  ? Normochromic normocytic anemia 01/13/2021  ? ? ?Patient's Medications  ?New Prescriptions  ? AMOXICILLIN-CLAVULANATE (AUGMENTIN) 875-125 MG TABLET    Take 1 tablet by mouth 2 (two) times daily.  ?Previous Medications  ? CHOLECALCIFEROL (VITAMIN D) 25 MCG (1000 UNIT) TABLET    Take 1,000 Units by mouth daily.  ? LACTOBACILLUS (PROBIOTIC ACIDOPHILUS) CAPS    Take 1 capsule by mouth daily.  ? MISC NATURAL PRODUCTS (PROSTATE) CAPS    Take 1 capsule by mouth in the morning and at bedtime. supplement  ? MULTIPLE VITAMINS-MINERALS (EYE VITAMINS PO)    Take 1 tablet by mouth daily. supplement  ? NON FORMULARY    Cromium  ?Modified Medications  ? No medications on file  ?Discontinued Medications  ? SULFAMETHOXAZOLE-TRIMETHOPRIM (BACTRIM DS) 800-160 MG TABLET    Take 2 tablets by mouth 2 (two) times daily.  ? ? ?Subjective: ?Brett Bean is in for a routine follow-up visit.  He is now a little over 4 months into therapy for nocardial pneumonia with trimethoprim/sulfamethoxazole.  When he was in last week blood work showed that his creatinine had gone up to 1.55. ? ?Review of Systems: ?Review of Systems  ?Respiratory:  Negative for cough, sputum production and shortness of breath.   ?Genitourinary:  Positive for frequency. Negative for dysuria and urgency.  ?     There has been no change in his nocturia  ? ?Past Medical History:  ?Diagnosis Date  ? Hyperlipidemia   ? ? ?Social History  ? ?Tobacco Use  ? Smoking status: Former  ?  Types: Cigarettes  ? Smokeless tobacco: Never  ?Substance Use Topics  ?  Alcohol use: Never  ? Drug use: Never  ? ? ?Family History  ?Problem Relation Age of Onset  ? Cancer Mother   ? Heart disease Father   ? Heart failure Father   ? Heart murmur Father   ? Hypertension Sister   ? ? ?Allergies  ?Allergen Reactions  ? Asa [Aspirin] Nausea And Vomiting  ? ? ?Objective: ?Vitals:  ? 06/13/21 1444  ?BP: 132/80  ?Pulse: 71  ?Temp: 98.2 ?F (36.8 ?C)  ?TempSrc: Temporal  ?Weight: 179 lb (81.2 kg)  ? ?Body mass index is 27.22 kg/m?. ? ?Physical Exam ?Constitutional:   ?   Comments: He is accompanied by his wife and daughter.  ?Cardiovascular:  ?   Rate and Rhythm: Normal rate.  ?Pulmonary:  ?   Effort: Pulmonary effort is normal.  ?Psychiatric:     ?   Mood and Affect: Mood normal.  ? ? ?Lab Results ?CMP  ?   ?Component Value Date/Time  ? NA 138 06/13/2021 1512  ? K 4.4 06/13/2021 1512  ? CL 103 06/13/2021 1512  ? CO2 25 06/13/2021 1512  ? GLUCOSE 86 06/13/2021 1512  ? BUN 17 06/13/2021 1512  ? CREATININE 1.61 (H) 06/13/2021 1512  ? CALCIUM 9.3 06/13/2021 1512  ? PROT 7.4 02/01/2021 1412  ? ALBUMIN 3.6 02/01/2021 1412  ? AST 15 02/01/2021 1412  ?  ALT 24 02/01/2021 1412  ? ALKPHOS 76 02/01/2021 1412  ? BILITOT 0.5 02/01/2021 1412  ? GFRNONAA >60 01/19/2021 0826  ?  ?  ?Problem List Items Addressed This Visit   ? ?  ? High  ? Nocardial pneumonia ([place])  ?  I will repeat his creatinine today.  If it continues to rise will discuss changing trimethoprim/sulfamethoxazole to an alternate regimen or simply stopping antibiotic therapy soon. ? ?Addendum: His creatinine is up more to 1.61. I will switch trimethoprim-sulfamethoxazole to amoxicillin-clavulanate. ?  ?  ? Relevant Medications  ? amoxicillin-clavulanate (AUGMENTIN) 875-125 MG tablet  ? Other Relevant Orders  ? Basic metabolic panel (Completed)  ? ? ? ?Michel Bickers, MD ?[facility] for Infectious Disease ?[place] ?[phone] pager   [phone] cell ?06/14/2021, 2:43 PM ?

## 2021-06-14 LAB — BASIC METABOLIC PANEL
BUN/Creatinine Ratio: 11 (calc) (ref 6–22)
BUN: 17 mg/dL (ref 7–25)
CO2: 25 mmol/L (ref 20–32)
Calcium: 9.3 mg/dL (ref 8.6–10.3)
Chloride: 103 mmol/L (ref 98–110)
Creat: 1.61 mg/dL — ABNORMAL HIGH (ref 0.70–1.28)
Glucose, Bld: 86 mg/dL (ref 65–99)
Potassium: 4.4 mmol/L (ref 3.5–5.3)
Sodium: 138 mmol/L (ref 135–146)

## 2021-06-14 MED ORDER — AMOXICILLIN-POT CLAVULANATE 875-125 MG PO TABS: 1.0000 | ORAL_TABLET | Freq: Two times a day (BID) | ORAL | 1 refills | Status: DC

## 2021-06-14 NOTE — Addendum Note (Signed)
Addended by: Michel Bickers on: 06/14/2021 02:43 PM ? ? Modules accepted: Orders ? ?

## 2021-06-23 DIAGNOSIS — J9601 Acute respiratory failure with hypoxia: Secondary | ICD-10-CM | POA: Diagnosis not present

## 2021-07-11 ENCOUNTER — Ambulatory Visit: Payer: Medicare Other | Admitting: Internal Medicine

## 2021-07-23 DIAGNOSIS — J9601 Acute respiratory failure with hypoxia: Secondary | ICD-10-CM | POA: Diagnosis not present

## 2021-08-09 ENCOUNTER — Ambulatory Visit: Payer: Medicare Other | Admitting: Internal Medicine

## 2021-08-09 ENCOUNTER — Ambulatory Visit
Admission: RE | Admit: 2021-08-09 | Discharge: 2021-08-09 | Disposition: A | Discharge status: Home / Self Care | Payer: Medicare Other | Source: Ambulatory Visit | Attending: Internal Medicine | Admitting: Internal Medicine

## 2021-08-09 ENCOUNTER — Other Ambulatory Visit: Payer: Self-pay

## 2021-08-09 DIAGNOSIS — N289 Disorder of kidney and ureter, unspecified: Secondary | ICD-10-CM | POA: Diagnosis not present

## 2021-08-09 DIAGNOSIS — A430 Pulmonary nocardiosis: Secondary | ICD-10-CM

## 2021-08-09 DIAGNOSIS — J984 Other disorders of lung: Secondary | ICD-10-CM | POA: Diagnosis not present

## 2021-08-09 DIAGNOSIS — J90 Pleural effusion, not elsewhere classified: Secondary | ICD-10-CM | POA: Diagnosis not present

## 2021-08-09 DIAGNOSIS — R972 Elevated prostate specific antigen [PSA]: Secondary | ICD-10-CM | POA: Diagnosis not present

## 2021-08-09 NOTE — Assessment & Plan Note (Signed)
I am quite hopeful that his nocardia pneumonia has been cured following 6 months of antibiotic therapy.  I asked him to stop amoxicillin/clavulanate today.  I will repeat a chest x-ray today to establish his new baseline.

## 2021-08-09 NOTE — Assessment & Plan Note (Signed)
I will check his creatinine again today.

## 2021-08-09 NOTE — Patient Instructions (Signed)
[facility] [phone] 

## 2021-08-09 NOTE — Progress Notes (Signed)
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   CHOLECALCIFEROL (VITAMIN D) 25 MCG (1000 UNIT) TABLET    Take 1,000 Units by mouth daily.   LACTOBACILLUS (PROBIOTIC ACIDOPHILUS) CAPS    Take 1 capsule by mouth daily.   MISC NATURAL PRODUCTS (PROSTATE) CAPS    Take 1 capsule by mouth in the morning and at bedtime. supplement   MULTIPLE VITAMINS-MINERALS (EYE VITAMINS PO)    Take 1 tablet by mouth daily. supplement   NON FORMULARY    Cromium  Modified Medications   No medications on file  Discontinued Medications   No medications on file    Subjective: Brett Bean is in for a routine follow-up visit.  He is now a little over 6 months into therapy for nocardial pneumonia.  In April he developed some acute renal insufficiency, most likely due to trimethoprim/sulfamethoxazole.  I switched him to amoxicillin/clavulanate which he has tolerated well.  He does not have any lingering cough or shortness of breath.  He feels like he is close to his normal baseline other than feeling a slight imbalance and occasionally dropping things when he attempts to pick them up with either hand.  He says that these are very minor issues and seem to be improving slowly.  He has put off going to see a urologist, stating that he wanted to get through this treatment for his  pneumonia first.  He was found to have elevated PSA and enlarged prostate when he was in the hospital last fall.  Review of Systems: Review of Systems  Constitutional:  Negative for fever and weight loss.  Respiratory:  Negative for cough, sputum production and shortness of breath.   Genitourinary:  Positive for frequency. Negative for dysuria and urgency.       There has been no change in his nocturia  Neurological:        As noted in HPI.   Past Medical History:  Diagnosis Date   Hyperlipidemia     Social History   Tobacco Use   Smoking status: Former    Types: Cigarettes   Smokeless tobacco: Never  Substance Use Topics   Alcohol use: Never   Drug use: Never    Family History  Problem Relation Age of Onset   Cancer Mother    Heart disease Father    Heart failure Father    Heart murmur Father    Hypertension Sister     Allergies  Allergen Reactions   Asa [Aspirin] Nausea And Vomiting    Objective: Vitals:   08/09/21 0947  BP: (!) 144/80  Pulse: 69  Temp: (!) 97.5 F (36.4 C)  TempSrc: Oral  SpO2: 95%  Weight: 180 lb (81.6 kg)  Height: '5\' 8"'$  (1.727 m)   Body mass index is  27.37 kg/m.  Physical Exam Constitutional:      Comments: He is accompanied by his wife.  He is very calm and pleasant as usual.  Cardiovascular:     Rate and Rhythm: Normal rate and regular rhythm.     Heart sounds: No murmur heard. Pulmonary:     Effort: Pulmonary effort is normal.     Breath sounds: No wheezing, rhonchi or rales.     Comments: Slightly diminished breath sounds in right base posteriorly. Neurological:     Motor: No weakness.     Gait: Gait normal.  Psychiatric:        Mood and Affect: Mood normal.    Lab Results CMP     Component Value Date/Time   NA 138 06/13/2021 1512   K 4.4 06/13/2021 1512   CL 103 06/13/2021 1512   CO2 25 06/13/2021 1512   GLUCOSE 86 06/13/2021 1512   BUN 17 06/13/2021 1512   CREATININE 1.61 (H) 06/13/2021 1512   CALCIUM 9.3  06/13/2021 1512   PROT 7.4 02/01/2021 1412   ALBUMIN 3.6 02/01/2021 1412   AST 15 02/01/2021 1412   ALT 24 02/01/2021 1412   ALKPHOS 76 02/01/2021 1412   BILITOT 0.5 02/01/2021 1412   GFRNONAA >60 01/19/2021 0826      Problem List Items Addressed This Visit       High   Nocardial pneumonia ([place])    I am quite hopeful that his nocardia pneumonia has been cured following 6 months of antibiotic therapy.  I asked him to stop amoxicillin/clavulanate today.  I will repeat a chest x-ray today to establish his new baseline.       Relevant Orders   DG Chest 2 View     Medium    Elevated PSA    I will repeat his PSA today at his request and have given him information about referral to [facility].       Relevant Orders   PSA     Unprioritized   Acute renal insufficiency    I will check his creatinine again today.       Relevant Orders   Basic metabolic panel     Michel Bickers, MD [facility] for [place] [phone] pager   [phone] cell 08/09/2021, 10:11 AM

## 2021-08-09 NOTE — Assessment & Plan Note (Signed)
I will repeat his PSA today at his request and have given him information about referral to [facility].

## 2021-08-10 LAB — BASIC METABOLIC PANEL
BUN: 12 mg/dL (ref 7–25)
CO2: 27 mmol/L (ref 20–32)
Calcium: 9.6 mg/dL (ref 8.6–10.3)
Chloride: 107 mmol/L (ref 98–110)
Creat: 1.2 mg/dL (ref 0.70–1.28)
Glucose, Bld: 85 mg/dL (ref 65–99)
Potassium: 3.8 mmol/L (ref 3.5–5.3)
Sodium: 142 mmol/L (ref 135–146)

## 2021-08-10 LAB — PSA: PSA: 5.07 ng/mL — ABNORMAL HIGH (ref <=4.00)

## 2021-08-17 ENCOUNTER — Ambulatory Visit (INDEPENDENT_AMBULATORY_CARE_PROVIDER_SITE_OTHER): Payer: Medicare Other | Admitting: Internal Medicine

## 2021-08-17 ENCOUNTER — Other Ambulatory Visit: Payer: Self-pay

## 2021-08-17 DIAGNOSIS — A430 Pulmonary nocardiosis: Secondary | ICD-10-CM | POA: Diagnosis not present

## 2021-08-17 NOTE — Progress Notes (Signed)
Virtual Visit via Telephone Note  I connected with Brett Bean on 08/17/21 at  9:30 AM EDT by telephone and verified that I am speaking with the correct person using two identifiers.  Location: Patient: Home Provider: [facility]   I discussed the limitations, risks, security and privacy concerns of performing an evaluation and management service by telephone and the availability of in person appointments. I also discussed with the patient that there may be a patient responsible charge related to this service. The patient expressed understanding and agreed to proceed.   History of Present Illness: I called and spoke with Brett Bean today.  When he was in to see me last week I had him stop amoxicillin/clavulanate.  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  He is feeling well.   Observations/Objective: CMP     Component Value Date/Time   NA 142 08/09/2021 1005   K 3.8 08/09/2021 1005   CL 107 08/09/2021 1005   CO2 27 08/09/2021 1005   GLUCOSE 85 08/09/2021 1005   BUN 12 08/09/2021 1005   CREATININE 1.20 08/09/2021 1005   CALCIUM 9.6 08/09/2021 1005   PROT 7.4 02/01/2021 1412   ALBUMIN 3.6 02/01/2021 1412   AST 15 02/01/2021 1412   ALT 24 02/01/2021 1412   ALKPHOS 76 02/01/2021 1412   BILITOT 0.5 02/01/2021 1412   GFRNONAA >60 01/19/2021 0826    PSA 08/09/2021 elevated at 5.07  Chest x-ray 08/09/2021 IMPRESSION: No acute cardiopulmonary disease identified. Elevation of right hemidiaphragm with mild scar of right lung base.  CT abdomen and pelvis 01/19/2021 IMPRESSION: 1. Enlarged prostate with bladder base impression and calcifications posteriorly. 2. Bladder thickening which could be due to hypertrophy or cystitis. 3. Slightly prominent inguinal chain lymph nodes but no intrapelvic adenopathy. 4. Diverticulosis without evidence of diverticulitis. 5. Additional findings as above.   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  He had developed acute renal insufficiency due to trimethoprim sulfamethoxazole but his renal function has returned to normal after stopping it.  I have given him the number for [facility] and encouraged him to arrange evaluation for his enlarged prostate and elevated PSA.  Follow Up Instructions: Follow-up here as needed   I discussed the assessment and treatment plan with the patient. The patient was provided an opportunity to ask questions and all were answered. The patient agreed with the plan and demonstrated an understanding of the instructions.   The patient was advised to call back or seek an in-person evaluation if the symptoms worsen or if the condition fails to improve as anticipated.  I provided 18 minutes of non-face-to-face time during this encounter.   Michel Bickers, MD

## 2021-08-23 DIAGNOSIS — J9601 Acute respiratory failure with hypoxia: Secondary | ICD-10-CM | POA: Diagnosis not present

## 2021-08-29 DIAGNOSIS — A430 Pulmonary nocardiosis: Secondary | ICD-10-CM | POA: Diagnosis not present

## 2021-08-29 DIAGNOSIS — K5730 Diverticulosis of large intestine without perforation or abscess without bleeding: Secondary | ICD-10-CM | POA: Diagnosis not present

## 2021-08-29 DIAGNOSIS — R7303 Prediabetes: Secondary | ICD-10-CM | POA: Diagnosis not present

## 2021-09-22 DIAGNOSIS — J9601 Acute respiratory failure with hypoxia: Secondary | ICD-10-CM | POA: Diagnosis not present

## 2021-10-23 DIAGNOSIS — J9601 Acute respiratory failure with hypoxia: Secondary | ICD-10-CM | POA: Diagnosis not present

## 2021-10-26 ENCOUNTER — Encounter: Payer: Self-pay | Admitting: Pulmonary Disease

## 2021-10-27 NOTE — Telephone Encounter (Signed)
Dr. Dewald, please see mychart message sent by pt and advise. 

## 2021-11-23 DIAGNOSIS — J9601 Acute respiratory failure with hypoxia: Secondary | ICD-10-CM | POA: Diagnosis not present

## 2021-11-28 ENCOUNTER — Telehealth: Payer: Self-pay | Admitting: Pulmonary Disease

## 2021-12-01 NOTE — Telephone Encounter (Signed)
Spoke with [employer] who confirmed that they had received CMN. Nothing further needed at this time.

## 2021-12-01 NOTE — Telephone Encounter (Signed)
No I have not got anything on this patient as of yet

## 2021-12-23 DIAGNOSIS — J9601 Acute respiratory failure with hypoxia: Secondary | ICD-10-CM | POA: Diagnosis not present

## 2021-12-27 ENCOUNTER — Ambulatory Visit: Payer: Medicare Other | Admitting: Pulmonary Disease

## 2021-12-29 ENCOUNTER — Encounter: Payer: Self-pay | Admitting: Nurse Practitioner

## 2021-12-29 ENCOUNTER — Ambulatory Visit: Payer: Medicare Other | Admitting: Nurse Practitioner

## 2021-12-29 VITALS — BP 124/72 | HR 69 | Ht 68.0 in | Wt 185.8 lb

## 2021-12-29 DIAGNOSIS — G4734 Idiopathic sleep related nonobstructive alveolar hypoventilation: Secondary | ICD-10-CM

## 2021-12-29 DIAGNOSIS — A430 Pulmonary nocardiosis: Secondary | ICD-10-CM

## 2021-12-29 NOTE — Assessment & Plan Note (Signed)
Admitted 10/13 to 10/16 for pneumonia and discharged home on extended course of Augmentin. He was re-admitted 11/11 to 11/18 with progressive symptoms and new oxygen requirement. There was not much change in the 10.2cm bilobed mass like opacity of the right middle and lower lobe with new multifocal patchy/nodular opacities in the lungs bilaterally.  Patient had CT-guided biopsy of the right lower lobe mass lesion via interventional radiology.  Pathology was notable for acute and organizing pneumonia with GMS staining showing presence of filamentous organisms consistent with nocardia.  Patient was started on high-dose steroid treatment for the organizing pneumonia on 01/18/2021.  Bactrim DS 2 tabs twice daily was then started on 01/27/2021 when pathology staining showed evidence of Nocardia.  Steroids were tapered off in February 2023. CT chest from 04/19/21 showed significant improvement. He was seen by ID in April 2023 with AKI d/t Bactrim. He was transitioned to augmentin, which he completed in June 2023 to complete 6 months of therapy.   He has been doing well since. No residual respiratory symptoms. Hopeful his nocardia is cured. We will plan to repeat CT chest for further evaluation.   Patient Instructions  CT chest ordered - someone will contact you for scheduling  Overnight oximetry ordered - someone will contact you for scheduling. Do not wear your oxygen during this study. We will discuss the need to continue oxygen therapy at night once we get your results back  Follow up with Dr. Erin Fulling in 6 months or as needed depending on imaging results.

## 2021-12-29 NOTE — Patient Instructions (Addendum)
CT chest ordered - someone will contact you for scheduling  Overnight oximetry ordered - someone will contact you for scheduling. Do not wear your oxygen during this study. We will discuss the need to continue oxygen therapy at night once we get your results back  Follow up with Dr. Erin Fulling in 6 months or as needed depending on imaging results.

## 2021-12-29 NOTE — Progress Notes (Signed)
$'@Patient's$  ID: Brett Bean, male    DOB: 06-May-1945, 76 y.o.   MRN: [id number]  Chief Complaint  Patient presents with   Follow-up    Pt f/u he denies any SOB, coughing/wheezing. Feels like he is doing a lot better. He hasn't been using his oxygen because he is feeling better.    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  He is a patient of Dr. August Albino and last seen in office 06/06/2021.  Past medical history significant for anemia and AKI related to bactrim use which is resolved.  TEST/EVENTS:  12/14/2020 CTA chest: Mild pretracheal right hilar lymphadenopathy.  There is moderate severity atelectasis and/or infiltrate in the right upper lobe, right middle lobe and right lower lobe.  There is a moderate to large right pleural effusion with loculated components.  No evidence pneumothorax. 01/13/2021 CT chest without contrast: There are small mediastinal lymph nodes including a dominant 18 mm subcarinal node.  There is a 10.2 x 6.6 x 6.5 cm bilobed mass along the inferior right middle lobe and lower lobes, previously measured 10.6 x 8.2 x 6.2.  New patchy/nodular opacity in the medial right upper lobe.  New 10 mm nodule in the left upper lobe.  New 2.7 cm nodule in the medial left upper lobe.  There is also a new 15 mm lesion in the left upper kidney.  Multifocal infection versus metastatic disease.  06/06/2021: OV with Dr. Erin Fulling.  Followed for nocardia pneumonia with organizing features.  Treated with steroids and Bactrim.  Followed by infectious disease.  Continues to do well.  Using 2 L of oxygen at night.  Recent Joselyn Arrow had showed 45 minutes with SPO2 less than 88%.  CT chest from 04/19/2021 showed improved right lower lobe consolidation with air bronchograms and improvement of the right upper lobe consolidation.  Has been able to be weaned off steroids.  Recommended repeating CT  imaging later this year.  Continue supplemental oxygen at night.  Plan to repeat Joselyn Arrow later in the year as well.  12/29/2021: Today-follow-up Patient presents today with his wife and daughter for follow-up.   Saw infectious disease on 08/17/2021; note reviewed.  Hopeful that he has cured his nocardia pneumonia.  Bactrim was stopped prior to this due to acute renal insufficiency and he was transitioned to Augmentin, which he completed in June. Kidney function improved.  He was referred to [facility] urology for evaluation of his enlarged prostate and elevated PSA.  Today, he tells me that he has been feeling well. No shortness of breath, coughing, chest congestion, fevers, night sweats, chills, hemoptysis, weight loss. He is very active. He mows the grass, washes the cars, grocery shops without any limitations. No pulmonary history prior to this; smoked for 20 years with 0.5 ppd max (10 pack year hx).   Allergies  Allergen Reactions   Asa [Aspirin] Nausea And Vomiting    Immunization History  Administered Date(s) Administered   Fluad Quad(high Dose 65+) 02/23/2021   Pfizer Covid-19 Vaccine Bivalent Booster 82yr & up 02/23/2021    Past Medical History:  Diagnosis Date   Hyperlipidemia     Tobacco History: Social History   Tobacco Use  Smoking Status Former   Types: Cigarettes  Smokeless Tobacco Never   Counseling given: Not Answered   Outpatient Medications Prior to Visit  Medication Sig Dispense Refill   cholecalciferol (VITAMIN D) 25 MCG (1000 UNIT) tablet Take  1,000 Units by mouth daily.     Lactobacillus (PROBIOTIC ACIDOPHILUS) CAPS Take 1 capsule by mouth daily.     Misc Natural Products (PROSTATE) CAPS Take 1 capsule by mouth in the morning and at bedtime. supplement     Multiple Vitamins-Minerals (EYE VITAMINS PO) Take 1 tablet by mouth daily. supplement     NON FORMULARY Cromium     No facility-administered medications prior to visit.     Review of Systems:    Constitutional: No weight loss or gain, night sweats, fevers, chills, fatigue, or lassitude. HEENT: No headaches, difficulty swallowing, tooth/dental problems, or sore throat. No sneezing, itching, ear ache, nasal congestion, or post nasal drip CV:  No chest pain, orthopnea, PND, swelling in lower extremities, anasarca, dizziness, palpitations, syncope Resp: No shortness of breath with exertion or at rest. No excess mucus or change in color of mucus. No productive or non-productive. No hemoptysis. No wheezing.  No chest wall deformity MSK:  No joint pain or swelling.  No decreased range of motion.  No back pain. Neuro: No dizziness or lightheadedness.  Psych: No depression or anxiety. Mood stable.     Physical Exam:  BP 124/72   Pulse 69   Ht '5\' 8"'$  (1.727 m)   Wt 185 lb 12.8 oz (84.3 kg)   SpO2 97%   BMI 28.25 kg/m   GEN: Pleasant, interactive, well-appearing; in no acute distress HEENT:  Normocephalic and atraumatic. PERRLA. Sclera white. Nasal turbinates pink, moist and patent bilaterally. No rhinorrhea present. Oropharynx pink and moist, without exudate or edema. No lesions, ulcerations, or postnasal drip.  NECK:  Supple w/ fair ROM. No JVD present. Normal carotid impulses w/o bruits. Thyroid symmetrical with no goiter or nodules palpated. No lymphadenopathy.   CV: RRR, no m/r/g, no peripheral edema. Pulses intact, +2 bilaterally. No cyanosis, pallor or clubbing. PULMONARY:  Unlabored, regular breathing. Clear bilaterally A&P w/o wheezes/rales/rhonchi. No accessory muscle use.  GI: BS present and normoactive. Soft, non-tender to palpation. No organomegaly or masses detected.  MSK: No erythema, warmth or tenderness. No deformities or joint swelling noted.  Neuro: A/Ox3. No focal deficits noted.   Skin: Warm, no lesions or rashe Psych: Normal affect and behavior. Judgement and thought content appropriate.     Lab Results:  CBC    Component Value Date/Time   WBC 5.3  06/07/2021 1005   RBC 4.18 (L) 06/07/2021 1005   HGB 12.8 (L) 06/07/2021 1005   HCT 38.8 06/07/2021 1005   PLT 209 06/07/2021 1005   MCV 92.8 06/07/2021 1005   MCH 30.6 06/07/2021 1005   MCHC 33.0 06/07/2021 1005   RDW 12.5 06/07/2021 1005   LYMPHSABS 0.6 (L) 02/01/2021 1412   MONOABS 0.6 02/01/2021 1412   EOSABS 0.0 02/01/2021 1412   BASOSABS 0.0 02/01/2021 1412    BMET    Component Value Date/Time   NA 142 08/09/2021 1005   K 3.8 08/09/2021 1005   CL 107 08/09/2021 1005   CO2 27 08/09/2021 1005   GLUCOSE 85 08/09/2021 1005   BUN 12 08/09/2021 1005   CREATININE 1.20 08/09/2021 1005   CALCIUM 9.6 08/09/2021 1005   GFRNONAA >60 01/19/2021 0826    BNP    Component Value Date/Time   BNP 196.2 (H) 01/12/2021 1603     Imaging:  No results found.        No data to display          No results found for: "NITRICOXIDE"  Assessment & Plan:   Nocardial pneumonia ([place]) Admitted 10/13 to 10/16 for pneumonia and discharged home on extended course of Augmentin. He was re-admitted 11/11 to 11/18 with progressive symptoms and new oxygen requirement. There was not much change in the 10.2cm bilobed mass like opacity of the right middle and lower lobe with new multifocal patchy/nodular opacities in the lungs bilaterally.  Patient had CT-guided biopsy of the right lower lobe mass lesion via interventional radiology.  Pathology was notable for acute and organizing pneumonia with GMS staining showing presence of filamentous organisms consistent with nocardia.   Patient was started on high-dose steroid treatment for the organizing pneumonia on 01/18/2021.  Bactrim DS 2 tabs twice daily was then started on 01/27/2021 when pathology staining showed evidence of Nocardia.  Steroids were tapered off in February 2023. CT chest from 04/19/21 showed significant improvement. He was seen by ID in April 2023 with AKI d/t Bactrim. He was transitioned to augmentin, which he completed in  June 2023 to complete 6 months of therapy.   He has been doing well since. No residual respiratory symptoms. Hopeful his nocardia is cured. We will plan to repeat CT chest for further evaluation.   Patient Instructions  CT chest ordered - someone will contact you for scheduling  Overnight oximetry ordered - someone will contact you for scheduling. Do not wear your oxygen during this study. We will discuss the need to continue oxygen therapy at night once we get your results back  Follow up with Dr. Erin Fulling in 6 months or as needed depending on imaging results.     Nocturnal hypoxemia He was started on supplemental O2 at night after ONO revealed hypoxemia with saturations <88% for 45 minutes. He was compliant with this but stopped using his oxygen a month or two ago. We will plan to repeat overnight oximetry to evaluated for ongoing requirement. If there is persistent hypoxia but CT imaging improved, we will consider pulmonary function testing to evaluate lung function/underlying etiology.    I spent 32 minutes of dedicated to the care of this patient on the date of this encounter to include pre-visit review of records, face-to-face time with the patient discussing conditions above, post visit ordering of testing, clinical documentation with the electronic health record, making appropriate referrals as documented, and communicating necessary findings to members of the patients care team.  Clayton Bibles, NP 12/29/2021  Pt aware and understands NP's role.

## 2021-12-29 NOTE — Assessment & Plan Note (Signed)
He was started on supplemental O2 at night after ONO revealed hypoxemia with saturations <88% for 45 minutes. He was compliant with this but stopped using his oxygen a month or two ago. We will plan to repeat overnight oximetry to evaluated for ongoing requirement. If there is persistent hypoxia but CT imaging improved, we will consider pulmonary function testing to evaluate lung function/underlying etiology.

## 2022-01-09 ENCOUNTER — Ambulatory Visit (HOSPITAL_COMMUNITY)
Admission: RE | Admit: 2022-01-09 | Discharge: 2022-01-09 | Disposition: A | Discharge status: Home / Self Care | Payer: Medicare Other | Source: Ambulatory Visit | Attending: Nurse Practitioner | Admitting: Nurse Practitioner

## 2022-01-09 DIAGNOSIS — A430 Pulmonary nocardiosis: Secondary | ICD-10-CM | POA: Diagnosis not present

## 2022-01-09 DIAGNOSIS — I7121 Aneurysm of the ascending aorta, without rupture: Secondary | ICD-10-CM | POA: Diagnosis not present

## 2022-01-19 ENCOUNTER — Telehealth: Payer: Self-pay | Admitting: Nurse Practitioner

## 2022-01-19 NOTE — Progress Notes (Signed)
Please notify pt CT chest has improved. 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 His PCP should follow this or send referral to CT surgery. Thanks.

## 2022-01-23 DIAGNOSIS — J9601 Acute respiratory failure with hypoxia: Secondary | ICD-10-CM | POA: Diagnosis not present

## 2022-01-29 NOTE — Telephone Encounter (Signed)
Called and spoke w/ pt he verbalized understanding. States he would like to hold off on using oxygen for now, but will call us if he changes his mind

## 2022-02-02 ENCOUNTER — Other Ambulatory Visit: Payer: Medicare Other

## 2022-02-09 ENCOUNTER — Telehealth: Payer: Self-pay

## 2022-02-09 NOTE — Telephone Encounter (Signed)
Pt's wife, Tandy Gaw, called to speak with Dr. Megan Salon. No one was available in triage so I told her a nurse would reach out to her on Dr. Hale Bogus behalf. She preferred to not discuss what the call was about. She can be reached at [phone]

## 2022-02-09 NOTE — Telephone Encounter (Signed)
Wife called concerned that patient has itching throat and she is on abx for URI. Advised to reach out to his PCP.

## 2022-02-20 DIAGNOSIS — R7303 Prediabetes: Secondary | ICD-10-CM | POA: Diagnosis not present

## 2022-02-20 DIAGNOSIS — A430 Pulmonary nocardiosis: Secondary | ICD-10-CM | POA: Diagnosis not present

## 2022-02-20 DIAGNOSIS — K5730 Diverticulosis of large intestine without perforation or abscess without bleeding: Secondary | ICD-10-CM | POA: Diagnosis not present

## 2022-02-20 DIAGNOSIS — E785 Hyperlipidemia, unspecified: Secondary | ICD-10-CM | POA: Diagnosis not present

## 2022-02-22 DIAGNOSIS — J9601 Acute respiratory failure with hypoxia: Secondary | ICD-10-CM | POA: Diagnosis not present

## 2022-03-25 DIAGNOSIS — J9601 Acute respiratory failure with hypoxia: Secondary | ICD-10-CM | POA: Diagnosis not present

## 2022-04-23 ENCOUNTER — Encounter: Payer: Self-pay | Admitting: Pulmonary Disease

## 2022-04-23 DIAGNOSIS — G4734 Idiopathic sleep related nonobstructive alveolar hypoventilation: Secondary | ICD-10-CM

## 2022-04-23 NOTE — Telephone Encounter (Signed)
Mychart message sent by pt requesting to have oxygen concentrator picked up as he has not been wearing his oxygen. Last time he wore it at night was back August 2023. Dr. Erin Fulling, please advise if you are okay with [place] placing an order for pt's oxygen to be picked up by DME.

## 2022-04-25 DIAGNOSIS — J9601 Acute respiratory failure with hypoxia: Secondary | ICD-10-CM | POA: Diagnosis not present

## 2022-07-26 ENCOUNTER — Encounter: Payer: Self-pay | Admitting: Pulmonary Disease

## 2022-07-26 ENCOUNTER — Ambulatory Visit: Payer: Medicare Other | Admitting: Pulmonary Disease

## 2022-07-26 VITALS — BP 124/78 | HR 71 | Temp 98.4°F | Ht 67.5 in | Wt 187.4 lb

## 2022-07-26 DIAGNOSIS — A430 Pulmonary nocardiosis: Secondary | ICD-10-CM

## 2022-07-26 DIAGNOSIS — J8489 Other specified interstitial pulmonary diseases: Secondary | ICD-10-CM | POA: Diagnosis not present

## 2022-07-26 NOTE — Patient Instructions (Signed)
Your CT Chest scan is much better than November 2022. 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   Follow up as needed, please call if you develop respiratory symptoms.

## 2022-07-26 NOTE — Progress Notes (Signed)
Synopsis: Referred in November 2022 for Hospital Follow Up of Nocardia Pneumonia with Organizing pneumonia features  Subjective:   PATIENT ID: Lucio Edward GENDER: male DOB: [birth year], MRN: [id number]  HPI  Chief Complaint  Patient presents with   Follow-up    Doing well.  No sx noted.   Corey Weseman is a 77 year old male, remote former smoker who comes to pulmonary clinic for follow up for nocardia pneumonia with organizing features.   He was seen by Rhunette Croft, NP 12/29/21, doing well at that time. He continues to do well since completing his antibiotic treatment last year. No fevers, chills or sweats. No issues with weight loss. His energy levels are good.   OV 06/06/21 Patient continues to do well. He is using 2L of oxygen at night. Recent ONO study showed he spent 45 minutes with SpO2 less than 88%. He remains on bactrim treatment per infectious diseases.   He had CT Chest scan 04/19/21 which showed improvement of the right lower lobe consolidation with air bronchograms and improvement of the right upper lobe consolidation. There is subcarinal adenopathy, measuring 2.2cm.  OV 04/04/21 He continues to do well. He has been tapered down to 5mg  of prednisone daily which will end in 4 days. He denies any respiratory symptoms or fevers, chills or sweats at this time. He remains on bactrim therapy per ID.   Chest x-ray today shows persistent right basilar opacity with stable mild elevation of the right hemidiaphragm and mild blunting of the right costphrenic angle. Not much improved from 02/23/21 x-ray.  OV 03/01/21 Patient has done well since last visit and continues on bactrim therapy for the nocardia and prednisone for the organizing pneumonia. He is down to 20mg  of prednisone daily. He was evaluated by infectious disease on 02/23/21 with repeat chest radiograph which showed improvement in the right pleural effusion and right lower lobe mass.   Patient denies night sweats or shortness  of breath. He continues to use oxygen at night when sleeping.   OV 02/01/21 Patient was admitted 10/13 to 10/16 for pneumonia and discharged home on extended course of Augmentin. He was re-admitted 11/11 to 11/18 with progressive symptoms and new oxygen requirement. There was not much change in the 10.2cm bilobed mass like opacity of the right middle and lower lobe with new multifocal patchy/nodular opacities in the lungs bilaterally.  Patient had CT-guided biopsy of the right lower lobe mass lesion via interventional radiology.  Pathology was notable for acute and organizing pneumonia with GMS staining showing presence of filamentous organisms consistent with nocardia.  Patient was started on high-dose steroid treatment for the organizing pneumonia on 01/18/2021.  Bactrim DS 2 tabs twice daily was then started on 01/27/2021 when pathology staining showed evidence of Nocardia.  Patient reports that his breathing is much better since hospitalization.  He complains of urinary frequency throughout the day and night.  He denies any fevers, chills or night sweats.  His daughter and wife are with him today.  Past Medical History:  Diagnosis Date   Hyperlipidemia      Family History  Problem Relation Age of Onset   Cancer Mother    Heart disease Father    Heart failure Father    Heart murmur Father    Hypertension Sister      Social History   Socioeconomic History   Marital status: Married    Spouse name: Not on file   Number of children: Not on file  Years of education: Not on file   Highest education level: Not on file  Occupational History   Not on file  Tobacco Use   Smoking status: Former    Years: 20    Types: Cigarettes    Quit date: 51    Years since quitting: 41.4   Smokeless tobacco: Never  Substance and Sexual Activity   Alcohol use: Never   Drug use: Never   Sexual activity: Not on file  Other Topics Concern   Not on file  Social History Narrative   Not on  file   Social Determinants of Health   Financial Resource Strain: Not on file  Food Insecurity: Not on file  Transportation Needs: Not on file  Physical Activity: Not on file  Stress: Not on file  Social Connections: Not on file  Intimate Partner Violence: Not on file     Allergies  Allergen Reactions   Asa [Aspirin] Nausea And Vomiting     Outpatient Medications Prior to Visit  Medication Sig Dispense Refill   cholecalciferol (VITAMIN D) 25 MCG (1000 UNIT) tablet Take 1,000 Units by mouth daily.     COD LIVER OIL PO Take by mouth.     Lactobacillus (PROBIOTIC ACIDOPHILUS) CAPS Take 1 capsule by mouth daily.     Misc Natural Products (PROSTATE) CAPS Take 1 capsule by mouth in the morning and at bedtime. supplement     Multiple Vitamins-Minerals (EYE VITAMINS PO) Take 1 tablet by mouth daily. supplement     NON FORMULARY Cromium     No facility-administered medications prior to visit.   Review of Systems  Constitutional:  Negative for chills, fever, malaise/fatigue and weight loss.  HENT:  Negative for congestion, sinus pain and sore throat.   Eyes: Negative.   Respiratory:  Negative for cough, hemoptysis, sputum production, shortness of breath and wheezing.   Cardiovascular:  Negative for chest pain, palpitations, orthopnea, claudication and leg swelling.  Gastrointestinal:  Negative for abdominal pain, heartburn, nausea and vomiting.  Genitourinary:  Negative for frequency and hematuria.  Musculoskeletal:  Negative for joint pain and myalgias.  Skin:  Negative for rash.  Neurological:  Negative for weakness.  Endo/Heme/Allergies: Negative.  Does not bruise/bleed easily.  Psychiatric/Behavioral: Negative.      Objective:   Vitals:   07/26/22 0907  BP: 124/78  Pulse: 71  Temp: 98.4 F (36.9 C)  TempSrc: Oral  SpO2: 98%  Weight: 187 lb 6.4 oz (85 kg)  Height: 5' 7.5" (1.715 m)    Physical Exam Constitutional:      General: He is not in acute distress. HENT:      Head: Normocephalic and atraumatic.  Eyes:     Conjunctiva/sclera: Conjunctivae normal.  Cardiovascular:     Rate and Rhythm: Normal rate and regular rhythm.     Pulses: Normal pulses.     Heart sounds: Normal heart sounds. No murmur heard. Pulmonary:     Effort: Pulmonary effort is normal.     Breath sounds: No decreased breath sounds, wheezing, rhonchi or rales.  Musculoskeletal:     Right lower leg: No edema.     Left lower leg: No edema.  Skin:    General: Skin is warm and dry.  Neurological:     General: No focal deficit present.     Mental Status: He is alert.    CBC    Component Value Date/Time   WBC 5.3 06/07/2021 1005   RBC 4.18 (L) 06/07/2021 1005   HGB  12.8 (L) 06/07/2021 1005   HCT 38.8 06/07/2021 1005   PLT 209 06/07/2021 1005   MCV 92.8 06/07/2021 1005   MCH 30.6 06/07/2021 1005   MCHC 33.0 06/07/2021 1005   RDW 12.5 06/07/2021 1005   LYMPHSABS 0.6 (L) 02/01/2021 1412   MONOABS 0.6 02/01/2021 1412   EOSABS 0.0 02/01/2021 1412   BASOSABS 0.0 02/01/2021 1412      Latest Ref Rng & Units 08/09/2021   10:05 AM 06/13/2021    3:12 PM 06/07/2021   10:05 AM  BMP  Glucose 65 - 99 mg/dL 85  86  93   BUN 7 - 25 mg/dL 12  17  16    Creatinine 0.70 - 1.28 mg/dL 1.61  0.96  0.45   BUN/Creat Ratio 6 - 22 (calc) NOT APPLICABLE  11  10   Sodium 135 - 146 mmol/L 142  138  137   Potassium 3.5 - 5.3 mmol/L 3.8  4.4  4.5   Chloride 98 - 110 mmol/L 107  103  103   CO2 20 - 32 mmol/L 27  25  27    Calcium 8.6 - 10.3 mg/dL 9.6  9.3  9.5     Chest imaging: CT Chest 01/09/22 Cardiovascular: Few scattered coronary artery calcifications are seen. There is ectasia of ascending thoracic aorta measuring 4.5 cm.   Mediastinum/Nodes: There are subcentimeter nodes in mediastinum with no significant change.   Lungs/Pleura: There is ectasia of bronchi in the lower lung fields. There is linear patchy infiltrate with air bronchogram in right lower lobe close to the dome of the  diaphragm. There is slight interval decrease in size of the infiltrate. No new focal infiltrates are seen. There is no pleural effusion or pneumothorax.  CT Chest 04/19/21 1. Interval decrease in the size of right lung base consolidation. Continued follow-up to resolution recommended. 2. Subcarinal adenopathy. 3. Indeterminate ill-defined 2 cm rounded exophytic lesion from the superior pole of the left kidney. This can be further evaluated with renal ultrasound. 4. Similar appearance of a 7 mm nodule in the anterior lingula. 5. Aortic Atherosclerosis  CXR 04/04/20 Unchanged residual right basilar pulmonary opacity which may reflect residual inflammation or postinflammatory scarring. No new findings.  CXR 02/23/21 Cardiomediastinal contours and hilar structures are stable. Lung show improved aeration. Mild blunting of RIGHT costodiaphragmatic sulcus with persistent RIGHT lower lobe airspace disease partially obscuring the RIGHT hemidiaphragmatic contour.   LEFT chest is clear.  No visible pneumothorax.   On limited assessment there is no acute skeletal process.  CT Chest 01/19/21 10.2 cm bilobed masslike opacity along the inferior right middle and lower lobes, possibly mildly improved from the prior. While this may reflect severe infection/pneumonia, given persistence, primary bronchogenic neoplasm is not excluded. Bronchoscopy is suggested.   New multifocal patchy/nodular opacities in the lungs bilaterally, possibly reflecting multifocal infection versus metastatic disease. Associated mediastinal lymphadenopathy, including an 18 mm short axis subcarinal node.   Small right pleural effusion, unchanged.  PFT:     No data to display          Labs:  Path 01/18/21: RLL Biopsy Acute and organizing pneumonia GMS stain + for filamentous organisms, consistent with Nocardia  Echo:  Heart Catheterization:  Assessment & Plan:   Nocardial pneumonia (HCC)  Organizing  pneumonia (HCC)  Discussion: Zbigniew Andis is a 77 year old male, remote former smoker who comes to pulmonary clinic for follow up for nocardia pneumonia with organizing features.   He has completed treatment for  his nocardia pneumonia and has done well since.   HRCT Chest scan shows residual scarring of RLL where infection and organizing pneumonia were mainly involved. He has bronchiectasis of the RLL.  He is to follow up as needed.  Melody Comas, MD  Pulmonary & Critical Care Office: [phone]   Current Outpatient Medications:    cholecalciferol (VITAMIN D) 25 MCG (1000 UNIT) tablet, Take 1,000 Units by mouth daily., Disp: , Rfl:    COD LIVER OIL PO, Take by mouth., Disp: , Rfl:    Lactobacillus (PROBIOTIC ACIDOPHILUS) CAPS, Take 1 capsule by mouth daily., Disp: , Rfl:    Misc Natural Products (PROSTATE) CAPS, Take 1 capsule by mouth in the morning and at bedtime. supplement, Disp: , Rfl:    Multiple Vitamins-Minerals (EYE VITAMINS PO), Take 1 tablet by mouth daily. supplement, Disp: , Rfl:    NON FORMULARY, Cromium, Disp: , Rfl:

## 2022-07-29 ENCOUNTER — Encounter: Payer: Self-pay | Admitting: Pulmonary Disease

## 2022-08-14 DIAGNOSIS — Z0001 Encounter for general adult medical examination with abnormal findings: Secondary | ICD-10-CM | POA: Diagnosis not present

## 2022-08-14 DIAGNOSIS — E785 Hyperlipidemia, unspecified: Secondary | ICD-10-CM | POA: Diagnosis not present

## 2022-08-14 DIAGNOSIS — E7800 Pure hypercholesterolemia, unspecified: Secondary | ICD-10-CM | POA: Diagnosis not present

## 2022-08-14 DIAGNOSIS — R7303 Prediabetes: Secondary | ICD-10-CM | POA: Diagnosis not present

## 2022-08-14 DIAGNOSIS — K5730 Diverticulosis of large intestine without perforation or abscess without bleeding: Secondary | ICD-10-CM | POA: Diagnosis not present

## 2022-10-05 IMAGING — CT CT PELVIS W/ CM
2 of 4 series · 15 of 46 positions shown, 17 images · IV contrast (omnipaque)
Comparison: No prior similar exam.

CLINICAL DATA: Prostate cancer, surveillance.

EXAM:
CT PELVIS WITH CONTRAST
TECHNIQUE: Multidetector CT imaging of the pelvis was performed using the
standard protocol following the bolus administration of intravenous
contrast.
CONTRAST:  90mL OMNIPAQUE IOHEXOL 300 MG/ML  SOLN

[Series 7: pelvis thin · axial · 0.82mm/px · z∈[-884,-646]mm · 12 of 432 slices shown, 14 images]
[im 18/432  soft-tissue]
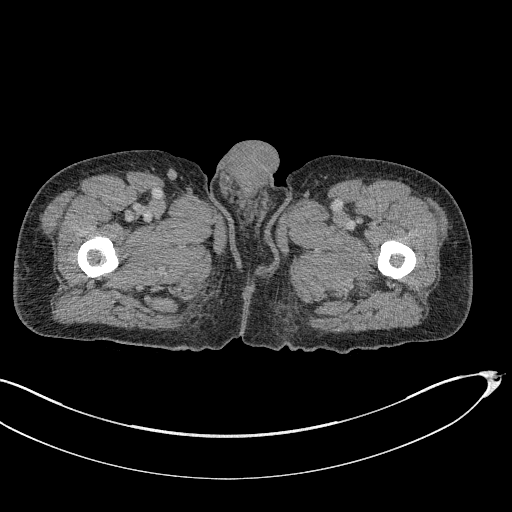
[im 18/432  bone]
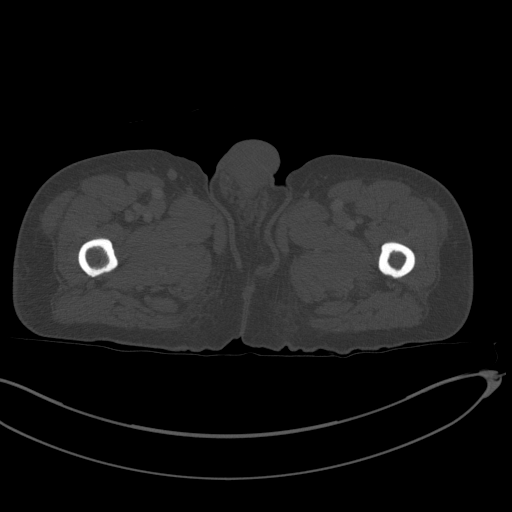
[im 54/432  soft-tissue]
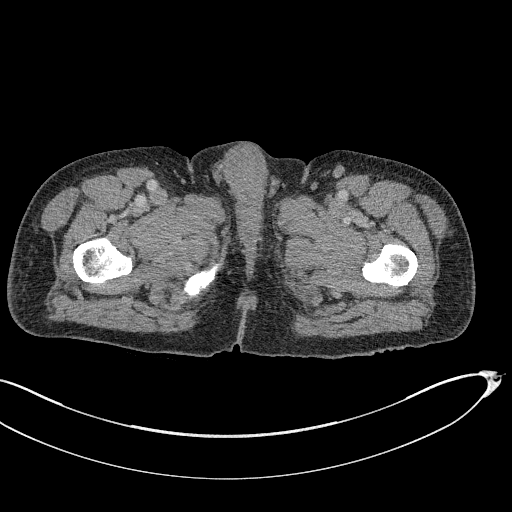
[im 90/432  soft-tissue]
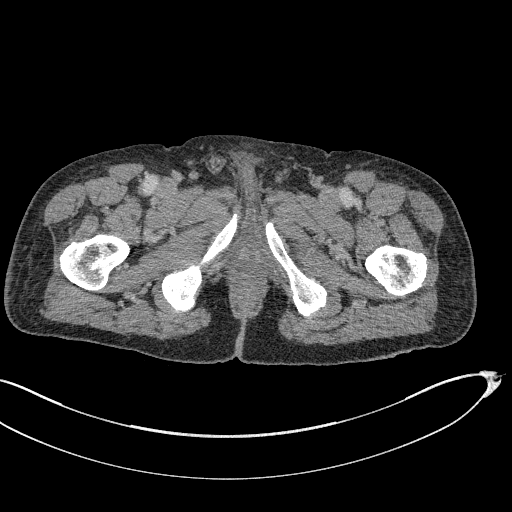
[im 126/432  soft-tissue]
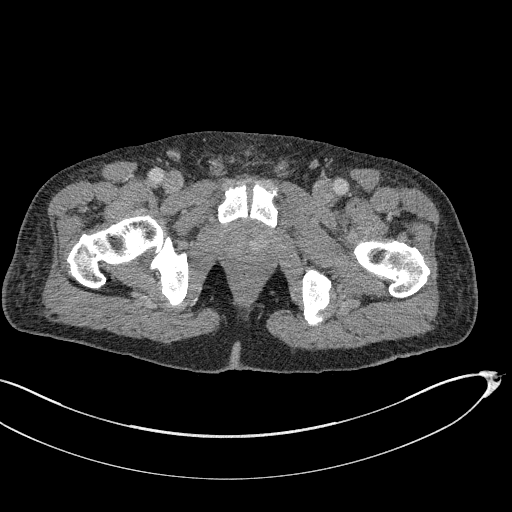
[im 162/432  soft-tissue]
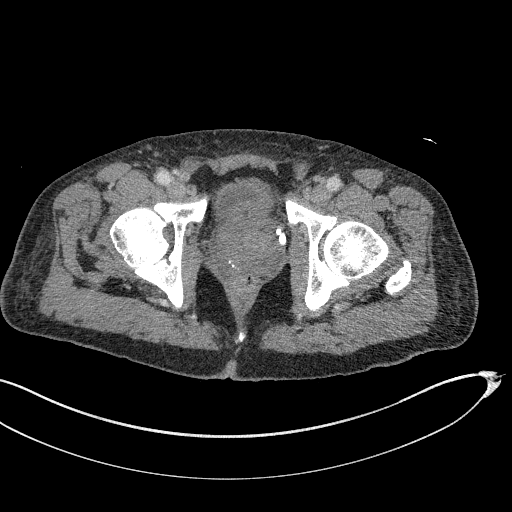
[im 198/432  soft-tissue]
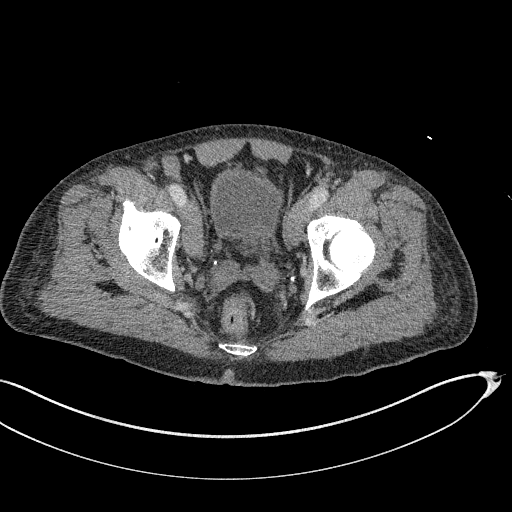
[im 234/432  soft-tissue]
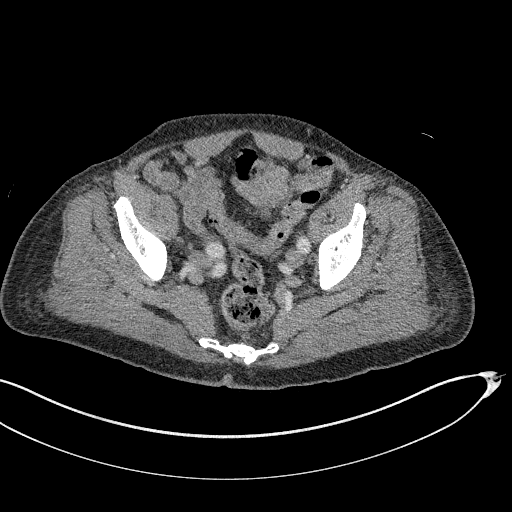
[im 270/432  soft-tissue]
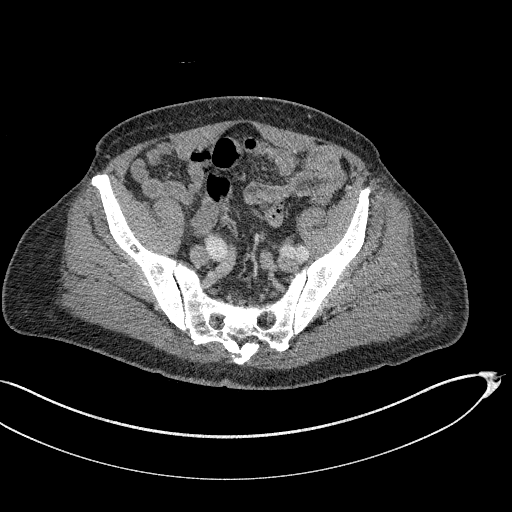
[im 306/432  soft-tissue]
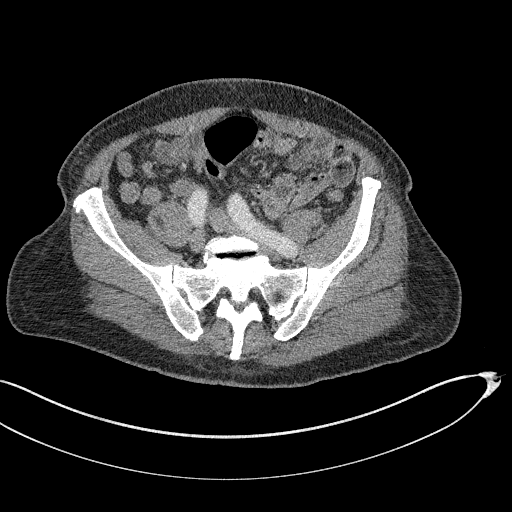
[im 306/432  bone]
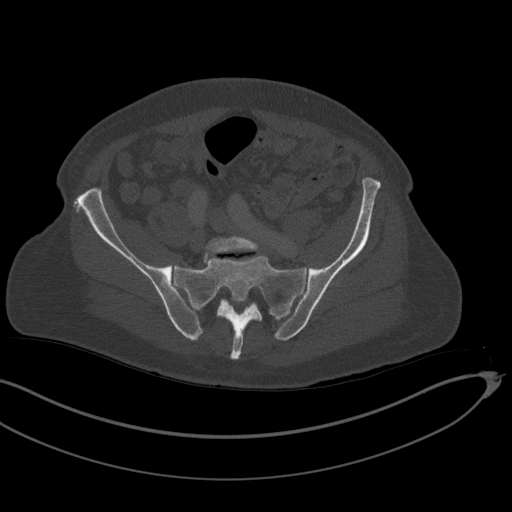
[im 342/432  soft-tissue]
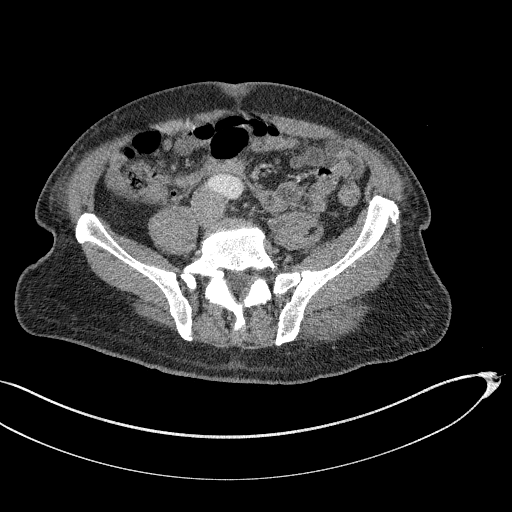
[im 378/432  soft-tissue]
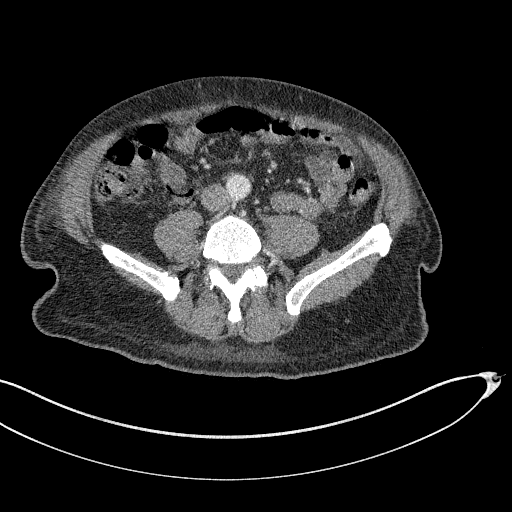
[im 414/432  soft-tissue]
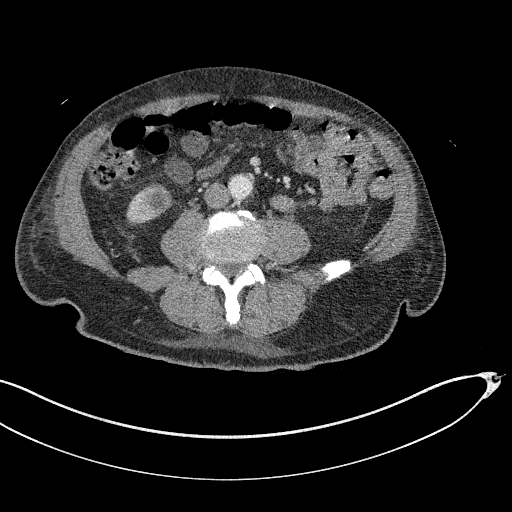

[Series 9: coronal st · coronal · 0.54mm/px · 3 of 137 slices shown]
[im 46/137  soft-tissue]
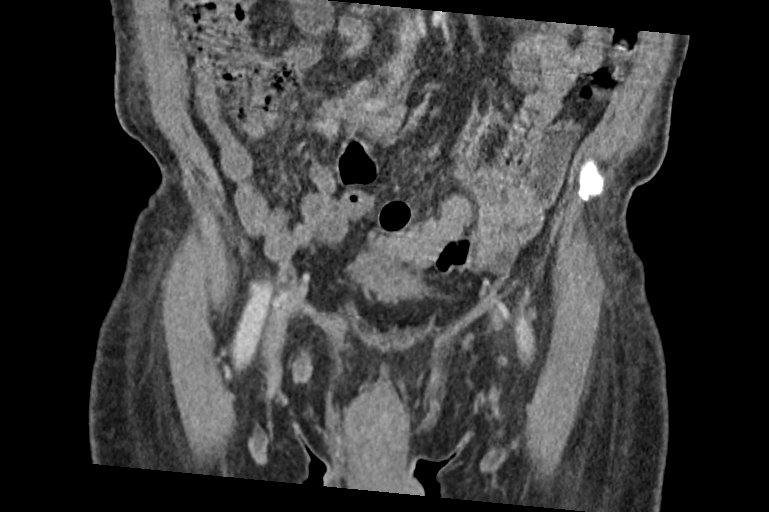
[im 61/137  soft-tissue]
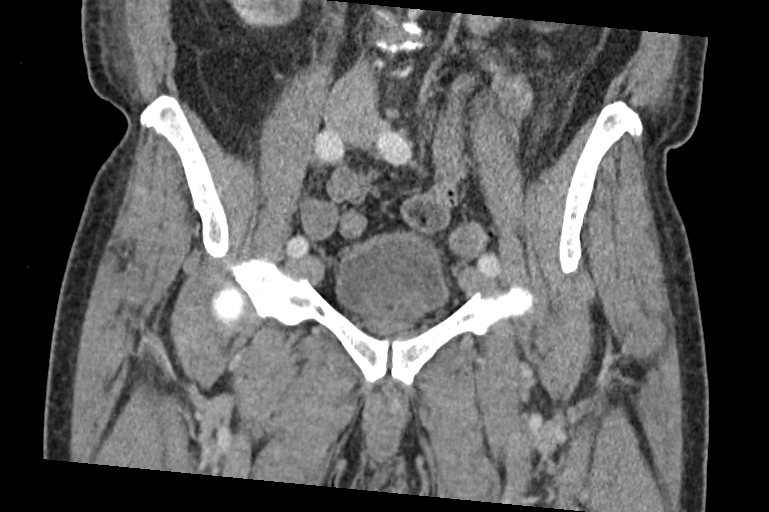
[im 76/137  soft-tissue]
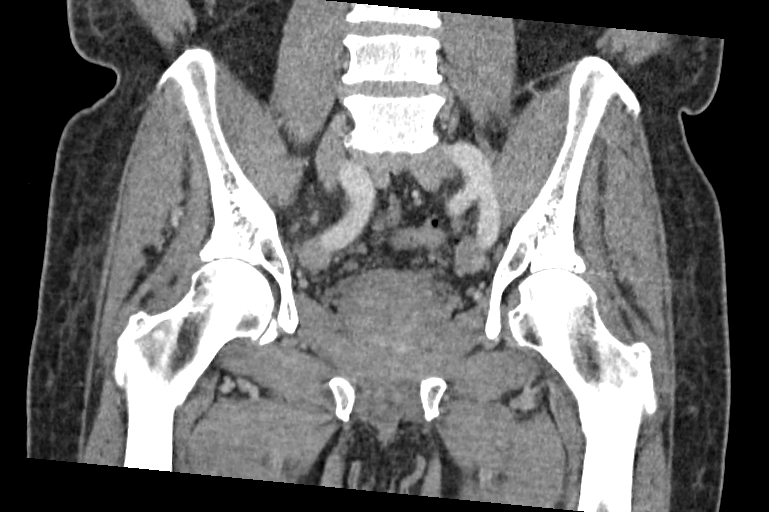

[15 of 46 positions shown; findings below may reference images not displayed]

Limited comparison is available
with MRI abdomen without and with contrast 01/16/2021 showing
indeterminate lesions of strong concern for metastasis in the T10,
T11, L2, and L4 vertebrae. This study did not include the prostate.
FINDINGS: Urinary Tract: The prostate is enlarged. This borders are not
optimally delineated due to motion artifact. It is estimated to
measure 6.0 cm transverse, 5.6 cm AP and 5.0 cm craniocaudal. There
are calcifications in the prostate posteriorly on both sides. There
is moderate impression on the posterior bladder and mild thickening
of the bladder wall in general without masslike thickening. The
distal ureters are clear. A small cyst is partially visible in the
inferior pole of the right kidney. There is no intravesical stone.
Are unremarkable.

Bowel: Sigmoid diverticulosis without evidence of diverticulitis.
Normal appendix. Otherwise unremarkable pelvic large and small bowel
unopacified loops.

Vascular/Lymphatic: No distal abdominal aortic aneurysm is seen.
There is mild tortuosity and ectasia of the iliac arteries without
stenosis or dissection. No enlarged pelvic lymph nodes are seen.
There are slightly prominent bilateral inguinal chain nodes, up to
1.1 cm on the right and up to 1.0 cm on the left, nonspecific. There
are pelvic phleboliths on both sides. The seminal vesicles

Reproductive:  As above.

Other: Small umbilical fat hernia. There is no free air, fluid or
hemorrhage.

Musculoskeletal: Vertebral metastases are better demonstrated on
MRI. No grossly destructive bone lesion is visible. There is mild
hip DJD, enthesopathic change to the pelvic wings, mild spurring at
the SI joints, pubic symphysis. There are chronic L5 pars defects
and slight grade 1 L5-S1 spondylolisthesis with partial L5-S1 disc
space loss and vacuum phenomenon.
IMPRESSION: 1. Enlarged prostate with bladder base impression and calcifications
posteriorly.
2. Bladder thickening which could be due to hypertrophy or cystitis.
3. Slightly prominent inguinal chain lymph nodes but no intrapelvic
adenopathy.
4. Diverticulosis without evidence of diverticulitis.
5. Additional findings as above.

## 2022-10-27 ENCOUNTER — Emergency Department (HOSPITAL_BASED_OUTPATIENT_CLINIC_OR_DEPARTMENT_OTHER): Payer: Medicare Other

## 2022-10-27 ENCOUNTER — Emergency Department (HOSPITAL_COMMUNITY)
Admission: EM | Admit: 2022-10-27 | Discharge: 2022-10-27 | Disposition: A | Discharge status: Home / Self Care | Payer: Medicare Other | Source: Home / Self Care | Attending: Emergency Medicine | Admitting: Emergency Medicine

## 2022-10-27 ENCOUNTER — Other Ambulatory Visit (HOSPITAL_COMMUNITY): Payer: Self-pay

## 2022-10-27 ENCOUNTER — Other Ambulatory Visit: Payer: Self-pay

## 2022-10-27 DIAGNOSIS — R609 Edema, unspecified: Secondary | ICD-10-CM

## 2022-10-27 DIAGNOSIS — I499 Cardiac arrhythmia, unspecified: Secondary | ICD-10-CM | POA: Diagnosis not present

## 2022-10-27 DIAGNOSIS — M79605 Pain in left leg: Secondary | ICD-10-CM | POA: Diagnosis present

## 2022-10-27 DIAGNOSIS — R030 Elevated blood-pressure reading, without diagnosis of hypertension: Secondary | ICD-10-CM | POA: Diagnosis not present

## 2022-10-27 DIAGNOSIS — I82452 Acute embolism and thrombosis of left peroneal vein: Secondary | ICD-10-CM

## 2022-10-27 DIAGNOSIS — R9431 Abnormal electrocardiogram [ECG] [EKG]: Secondary | ICD-10-CM | POA: Diagnosis not present

## 2022-10-27 DIAGNOSIS — M79662 Pain in left lower leg: Secondary | ICD-10-CM | POA: Diagnosis not present

## 2022-10-27 LAB — CBC WITH DIFFERENTIAL/PLATELET
Abs Immature Granulocytes: 0 10*3/uL (ref 0.00–0.07)
Basophils Absolute: 0 10*3/uL (ref 0.0–0.1)
Basophils Relative: 0 %
Eosinophils Absolute: 0.3 10*3/uL (ref 0.0–0.5)
Eosinophils Relative: 4 %
HCT: 45.7 % (ref 39.0–52.0)
Hemoglobin: 14.7 g/dL (ref 13.0–17.0)
Immature Granulocytes: 0 %
Lymphocytes Relative: 42 %
Lymphs Abs: 3 10*3/uL (ref 0.7–4.0)
MCH: 30.2 pg (ref 26.0–34.0)
MCHC: 32.2 g/dL (ref 30.0–36.0)
MCV: 94 fL (ref 80.0–100.0)
Monocytes Absolute: 0.7 10*3/uL (ref 0.1–1.0)
Monocytes Relative: 10 %
Neutro Abs: 3.1 10*3/uL (ref 1.7–7.7)
Neutrophils Relative %: 44 %
Platelets: 198 10*3/uL (ref 150–400)
RBC: 4.86 MIL/uL (ref 4.22–5.81)
RDW: 12.8 % (ref 11.5–15.5)
WBC: 7.1 10*3/uL (ref 4.0–10.5)
nRBC: 0 % (ref 0.0–0.2)

## 2022-10-27 LAB — COMPREHENSIVE METABOLIC PANEL
ALT: 16 U/L (ref 0–44)
AST: 22 U/L (ref 15–41)
Albumin: 4.2 g/dL (ref 3.5–5.0)
Alkaline Phosphatase: 42 U/L (ref 38–126)
Anion gap: 9 (ref 5–15)
BUN: 9 mg/dL (ref 8–23)
CO2: 25 mmol/L (ref 22–32)
Calcium: 9.2 mg/dL (ref 8.9–10.3)
Chloride: 105 mmol/L (ref 98–111)
Creatinine, Ser: 1.3 mg/dL — ABNORMAL HIGH (ref 0.61–1.24)
GFR, Estimated: 56 mL/min — ABNORMAL LOW (ref >60)
Glucose, Bld: 104 mg/dL — ABNORMAL HIGH (ref 70–99)
Potassium: 4 mmol/L (ref 3.5–5.1)
Sodium: 139 mmol/L (ref 135–145)
Total Bilirubin: 0.8 mg/dL (ref 0.3–1.2)
Total Protein: 7 g/dL (ref 6.5–8.1)

## 2022-10-27 LAB — MAGNESIUM: Magnesium: 2.1 mg/dL (ref 1.7–2.4)

## 2022-10-27 MED ORDER — APIXABAN 5 MG PO TABS
10.0000 mg | ORAL_TABLET | Freq: Once | ORAL | Status: AC
  Administered 2022-10-27: 10 mg via ORAL
  Filled 2022-10-27: qty 2

## 2022-10-27 MED ORDER — APIXABAN (ELIQUIS) EDUCATION KIT FOR DVT/PE PATIENTS
PACK | Freq: Once | Status: DC
  Filled 2022-10-27: qty 1

## 2022-10-27 MED ORDER — APIXABAN (ELIQUIS) VTE STARTER PACK (10MG AND 5MG)
ORAL_TABLET | ORAL | 0 refills | Status: AC
Start: 2022-10-27 — End: ?
  Filled 2022-10-27: qty 74, 30d supply, fill #0

## 2022-10-27 MED ORDER — APIXABAN (ELIQUIS) VTE STARTER PACK (10MG AND 5MG): ORAL_TABLET | ORAL | 0 refills | Status: DC

## 2022-10-27 NOTE — TOC Transition Note (Signed)
Discharge medications are filled and stored in the [facility] awaiting patient discharge.  ** WILL NEED TO BE PICKED UP **

## 2022-10-27 NOTE — ED Notes (Signed)
Waiting on medication from TOC pharm.

## 2022-10-27 NOTE — Progress Notes (Signed)
VASCULAR LAB    Left lower extremity venous duplex has been performed.  See CV proc for preliminary results.  Messaged results to Dr. Havery Moros, [facility], RVT 10/27/2022, 11:20 AM

## 2022-10-27 NOTE — ED Triage Notes (Signed)
Pt BIBPOV with left leg pain that started on Thursday and he could not walk then it got better on Friday, but pain is still there when ambulating, seen at UC this morning and sent here for further evaluation.

## 2022-10-27 NOTE — ED Provider Notes (Signed)
[place] EMERGENCY DEPARTMENT AT [facility] Provider Note   CSN: [id number] Arrival date & time: 10/27/22  1002     History  Chief Complaint  Patient presents with   Leg Pain   Irregular Heart Beat    Brett Bean is a 77 y.o. male.  HPI Patient presents with his wife who assists with the history.  They went to urgent care today, and was sent here for evaluation.  Patient notes that he is feeling generally well, but 2 days ago began having pain in the posterior of his left leg.  No fall, trauma, he was doing yard work, had to stop due to the discomfort.  He went to urgent care, and was sent here after being informed that he had an abnormal heart rhythm and concern for DVT.  Patient denies fever, dyspnea, chest pain, swelling, discoloration, warmth warmth in the leg.    Home Medications Prior to Admission medications   Medication Sig Start Date End Date Taking? Authorizing Provider  APIXABAN (ELIQUIS) VTE STARTER PACK (10MG  AND 5MG ) Take as directed on package: start with two-5mg  tablets twice daily for 7 days. On day 8, switch to one-5mg  tablet twice daily. 10/27/22  Yes Gerhard Munch, MD  cholecalciferol (VITAMIN D) 25 MCG (1000 UNIT) tablet Take 1,000 Units by mouth daily.    [provider]  COD LIVER OIL PO Take by mouth.    [provider]  Lactobacillus (PROBIOTIC ACIDOPHILUS) CAPS Take 1 capsule by mouth daily.    [provider]  Misc Natural Products (PROSTATE) CAPS Take 1 capsule by mouth in the morning and at bedtime. supplement    [provider]  Multiple Vitamins-Minerals (EYE VITAMINS PO) Take 1 tablet by mouth daily. supplement    [provider]  NON FORMULARY Cromium    [provider]      Allergies    Asa [aspirin]    Review of Systems   Review of Systems  All other systems reviewed and are negative.   Physical Exam Updated Vital Signs BP 126/84   Pulse 71   Temp 97.9 F (36.6 C)    Resp (!) 22   Ht 5\' 8"  (1.727 m)   Wt 86.2 kg   SpO2 99%   BMI 28.89 kg/m  Physical Exam Vitals and nursing note reviewed.  Constitutional:      General: He is not in acute distress.    Appearance: He is well-developed.  HENT:     Head: Normocephalic and atraumatic.  Eyes:     Conjunctiva/sclera: Conjunctivae normal.  Cardiovascular:     Rate and Rhythm: Normal rate and regular rhythm.     Pulses: Normal pulses.  Pulmonary:     Effort: Pulmonary effort is normal. No respiratory distress.     Breath sounds: No stridor.  Abdominal:     General: There is no distension.  Musculoskeletal:     Comments: The patient describes pain in the posterior of his leg, no notable abnormality palpable, no warmth, no erythema  Skin:    General: Skin is warm and dry.  Neurological:     Mental Status: He is alert and oriented to person, place, and time.     ED Results / Procedures / Treatments   Labs (all labs ordered are listed, but only abnormal results are displayed) Labs Reviewed  COMPREHENSIVE METABOLIC PANEL - Abnormal; Notable for the following components:      Result Value   Glucose, Bld 104 (*)  Creatinine, Ser 1.30 (*)    GFR, Estimated 56 (*)    All other components within normal limits  CBC WITH DIFFERENTIAL/PLATELET  MAGNESIUM    EKG EKG Interpretation Date/Time:  Saturday October 27 2022 10:14:55 EDT Ventricular Rate:  69 PR Interval:  162 QRS Duration:  96 QT Interval:  340 QTC Calculation: 365 R Axis:   -43  Text Interpretation: Sinus rhythm Atrial premature complex Abnormal R-wave progression, early transition LVH with secondary repolarization abnormality Confirmed by Gerhard Munch [phone]) on 10/27/2022 10:19:09 AM  Radiology VAS [place] LOWER EXTREMITY VENOUS (DVT) (7a-7p)  Result Date: 10/27/2022  Lower Venous DVT Study Patient Name:  Brett Bean  Date of Exam:   10/27/2022 Medical Rec #: [id number]       Accession #:    [id number] Date of Birth: 11/08/1945        Patient Gender: M Patient Age:   69 years Exam Location:  [facility] Procedure:      VAS [place] LOWER EXTREMITY VENOUS (DVT) Referring Phys: Molly Maduro Ramona Slinger --------------------------------------------------------------------------------  Indications: Posterior knee/calf pain since Thursday.  Comparison Study: No prior study on file Performing Technologist: Sherren Kerns RVS  Examination Guidelines: A complete evaluation includes B-mode imaging, spectral Doppler, color Doppler, and power Doppler as needed of all accessible portions of each vessel. Bilateral testing is considered an integral part of a complete examination. Limited examinations for reoccurring indications may be performed as noted. The reflux portion of the exam is performed with the patient in reverse Trendelenburg.  +-----+---------------+---------+-----------+----------+--------------+ RIGHTCompressibilityPhasicitySpontaneityPropertiesThrombus Aging +-----+---------------+---------+-----------+----------+--------------+ CFV  Full           Yes      Yes                                 +-----+---------------+---------+-----------+----------+--------------+   +---------+---------------+---------+-----------+----------+-------------------+ LEFT     CompressibilityPhasicitySpontaneityPropertiesThrombus Aging      +---------+---------------+---------+-----------+----------+-------------------+ CFV      Full           Yes      Yes                                      +---------+---------------+---------+-----------+----------+-------------------+ SFJ      Full                                                             +---------+---------------+---------+-----------+----------+-------------------+ FV Prox  Full                                                             +---------+---------------+---------+-----------+----------+-------------------+ FV Mid   Full                                                              +---------+---------------+---------+-----------+----------+-------------------+ FV DistalFull                                                             +---------+---------------+---------+-----------+----------+-------------------+  PFV      Full                                                             +---------+---------------+---------+-----------+----------+-------------------+ POP      Full           No       No                   vessel compresses,                                                        however color and                                                         Doppler flow are                                                          abnormal            +---------+---------------+---------+-----------+----------+-------------------+ PTV      Full                                                             +---------+---------------+---------+-----------+----------+-------------------+ PERO     Full                                                             +---------+---------------+---------+-----------+----------+-------------------+ Soleal   Full                                                             +---------+---------------+---------+-----------+----------+-------------------+ Gastroc  Full                                                             +---------+---------------+---------+-----------+----------+-------------------+ TP Trunk None           No       No  Acute               +---------+---------------+---------+-----------+----------+-------------------+     Summary: RIGHT: - No evidence of common femoral vein obstruction.   LEFT: - Findings consistent with acute deep vein thrombosis involving the Tibioperoneal trunk.  - No cystic structure found in the popliteal fossa.  *See table(s) above for measurements and observations.    Preliminary      Procedures Procedures    Medications Ordered in ED Medications  apixaban [facility]) Education Kit for DVT/PE patients (has no administration in time range)  apixaban (ELIQUIS) tablet 10 mg (has no administration in time range)    ED Course/ Medical Decision Making/ A&P                                 Medical Decision Making Adult male presents at the behest of urgent care practitioners with concern for arrhythmia, leg pain.  Patient's cardiac rhythm is sinus with occasional PAC, both on monitor on EKG, and absent chest pain, dyspnea, no indication for additional eval.  Patient's leg pain is nontraumatic, posterior, concern for DVT versus musculoskeletal injury.  Ultrasound ordered. Cardiac 60 sinus normal Pulse ox 100% room air normal   Amount and/or Complexity of Data Reviewed Independent Historian: spouse External Data Reviewed: notes.    Details: Urgent care referral note reviewed Labs: ordered. Decision-making details documented in ED Course. Radiology: ordered and independent interpretation performed. Decision-making details documented in ED Course. ECG/medicine tests: ordered and independent interpretation performed. Decision-making details documented in ED Course.  Risk Prescription drug management. Decision regarding hospitalization.   12:33 PM I discussed patient's case with our sonographer.  Patient found to have acute DVT.  Patient is otherwise in no distress, repeat exam, awake, alert, hemodynamically unremarkable.  Other labs reassuring. Discussed findings with the patient and his wife, they are amenable to start of Eliquis, outpatient follow-up.  Pharmacy has been consulted with assist with education on this medication, without other alarming findings, patient discharged in stable condition.         Final Clinical Impression(s) / ED Diagnoses Final diagnoses:  Acute deep vein thrombosis (DVT) of left peroneal vein (HCC)   CRITICAL CARE Performed by:  Gerhard Munch Total critical care time: 35 minutes Critical care time was exclusive of separately billable procedures and treating other patients. Critical care was necessary to treat or prevent imminent or life-threatening deterioration. Critical care was time spent personally by me on the following activities: development of treatment plan with patient and/or surrogate as well as nursing, discussions with consultants, evaluation of patient's response to treatment, examination of patient, obtaining history from patient or surrogate, ordering and performing treatments and interventions, ordering and review of laboratory studies, ordering and review of radiographic studies, pulse oximetry and re-evaluation of patient's condition.  Rx / DC Orders ED Discharge Orders          Ordered    APIXABAN (ELIQUIS) VTE STARTER PACK (10MG  AND 5MG )       Note to Pharmacy: If starter pack unavailable, substitute with seventy-four 5 mg apixaban tabs following the above SIG directions.   10/27/22 1233              Gerhard Munch, MD 10/27/22 1233

## 2022-10-27 NOTE — Discharge Instructions (Addendum)
As discussed, you have been diagnosed with a DVT or deep venous thromboembolism.  Please take your medication as newly prescribed and follow-up with your primary care physician for appropriate ongoing care.  Return here for concerning changes in your condition.

## 2022-10-27 NOTE — ED Notes (Signed)
Pt transported to [facility]

## 2022-11-07 ENCOUNTER — Telehealth: Payer: Self-pay

## 2022-11-07 NOTE — Telephone Encounter (Signed)
Transition Care Management Unsuccessful Follow-up Telephone Call  Date of discharge and from where:  10/27/2022 [facility]  Attempts:  1st Attempt  Reason for unsuccessful TCM follow-up call:  Left voice message  Mintie Witherington [facility]  [facility] Population Health Community Resource Care Guide   [email or web address]  ?? [phone]   Website: [email or web address]  [email or web address]

## 2022-11-08 ENCOUNTER — Telehealth: Payer: Self-pay

## 2022-11-08 NOTE — Telephone Encounter (Signed)
Transition Care Management Follow-up Telephone Call Date of discharge and from where: 10/27/2022 [facility] How have you been since you were released from the hospital? Patient stated he is feeling much better.  Any questions or concerns? No  Items Reviewed: Did the pt receive and understand the discharge instructions provided? Yes  Medications obtained and verified? Yes  Other?  Patient stated he experienced a rash from the adhesive on the EKG pads. Any new allergies since your discharge? No  Dietary orders reviewed? Yes Do you have support at home? Yes   Follow up appointments reviewed:  PCP Hospital f/u appt confirmed? No  Scheduled to see  on  @ . Specialist Hospital f/u appt confirmed? No  Scheduled to see  on  @ . Are transportation arrangements needed? No  If their condition worsens, is the pt aware to call PCP or go to the Emergency Dept.? Yes Was the patient provided with contact information for the PCP's office or ED? Yes Was to pt encouraged to call back with questions or concerns? Yes  Shahid Flori [facility]  [facility] Population Health Community Resource Care Guide   [email or web address]  ?? [phone]   Website: [email or web address]  [email or web address]

## 2022-11-09 IMAGING — CR DG CHEST 2V
2 series · 2 of 2 positions shown · non-contrast
Comparison: January 17, 2021.

CLINICAL DATA: Follow-up for pneumonia, history of Nocardia.

EXAM:
CHEST - 2 VIEW

[w chest pa *]
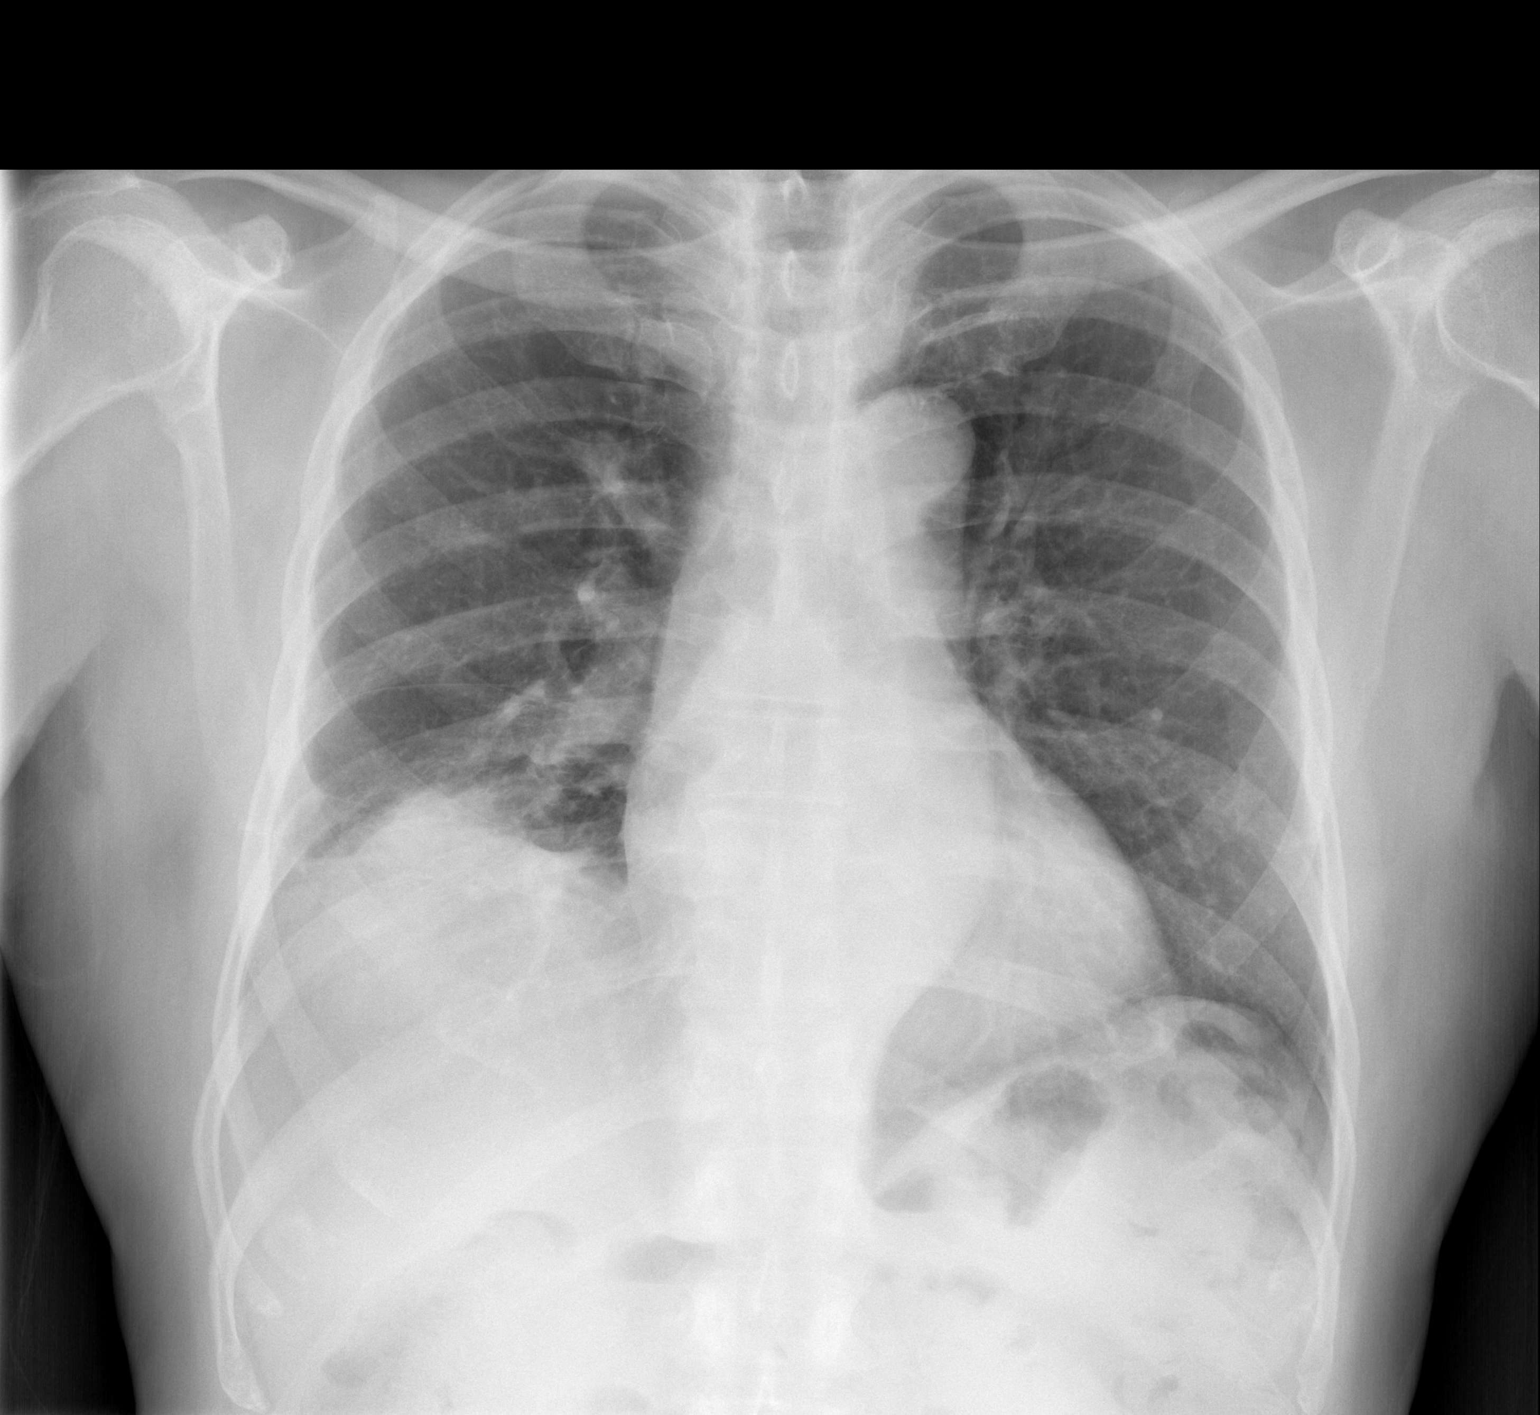

[w chest lat *]
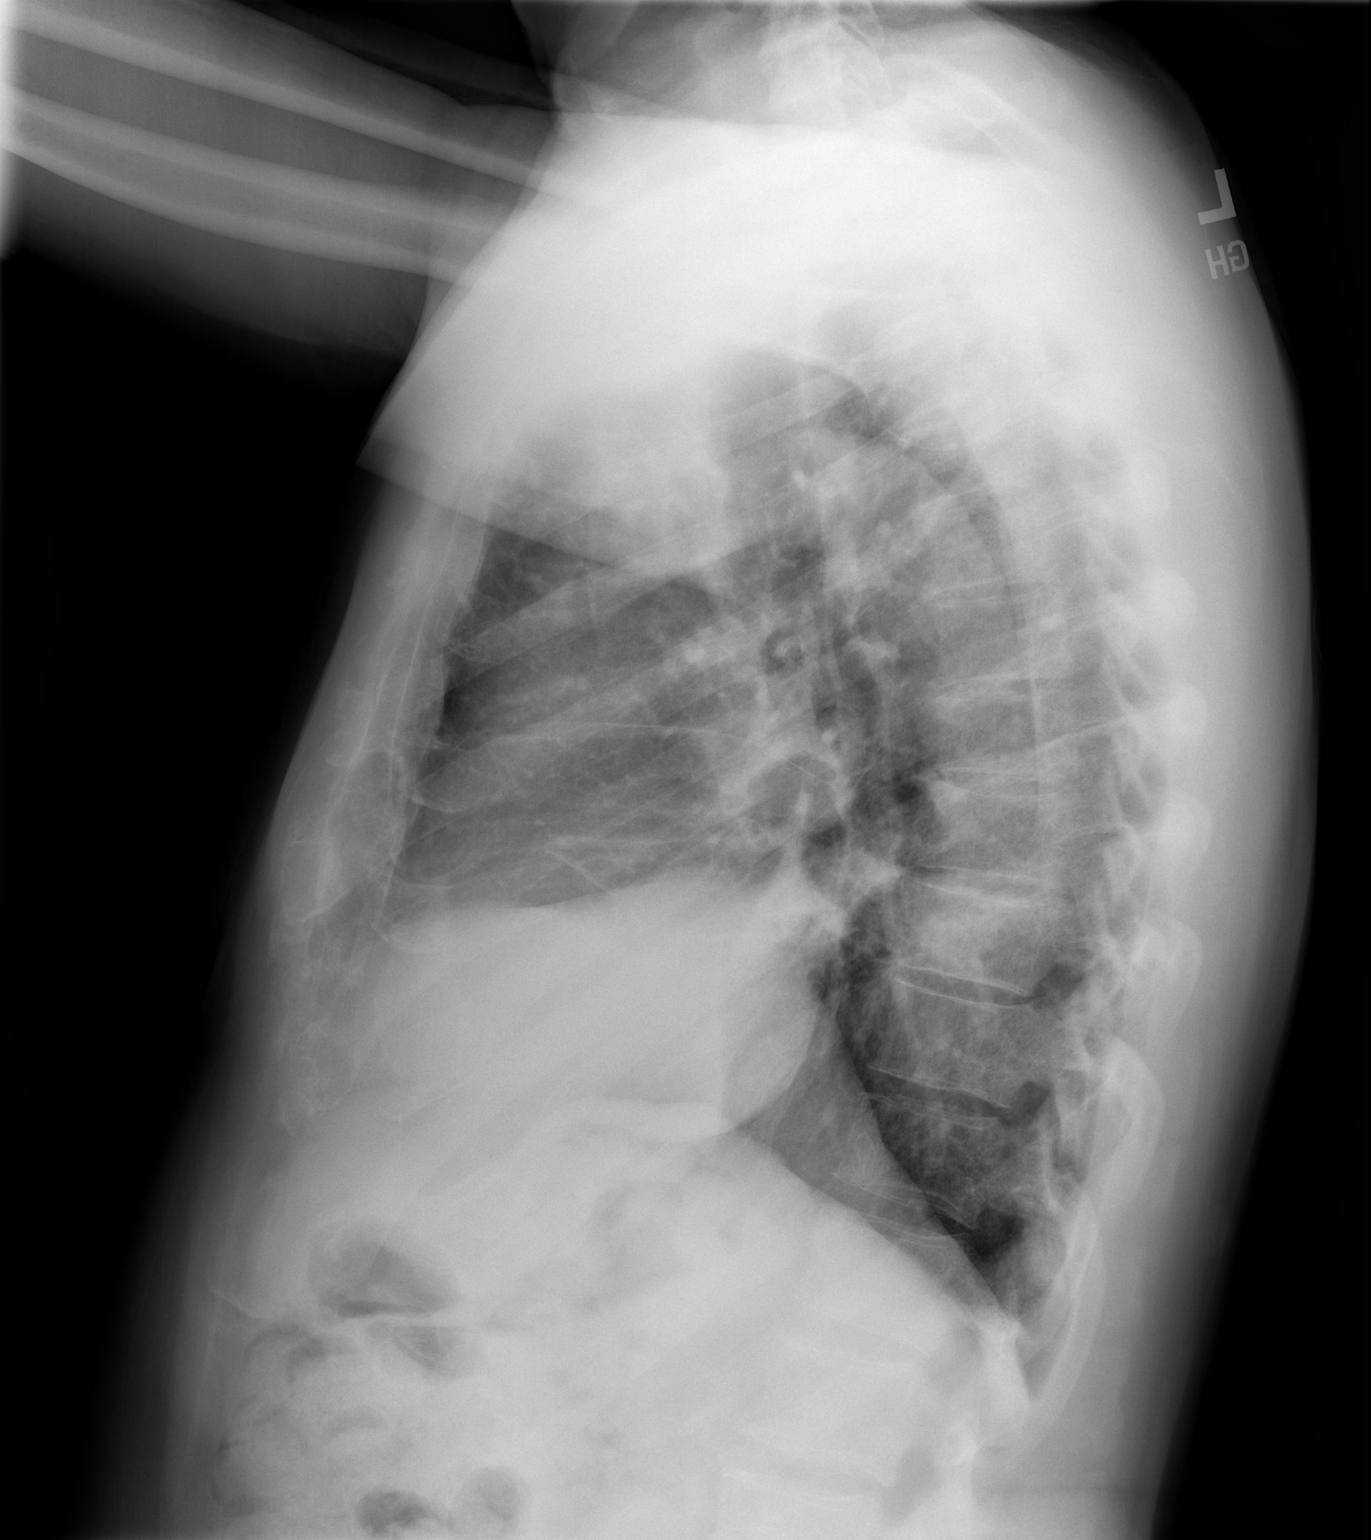

[2 of 2 positions shown; findings below may reference images not displayed]

FINDINGS: Trachea midline.

Cardiomediastinal contours and hilar structures are stable. Lung
show improved aeration. Mild blunting of RIGHT costodiaphragmatic
sulcus with persistent RIGHT lower lobe airspace disease partially
obscuring the RIGHT hemidiaphragmatic contour.

LEFT chest is clear.  No visible pneumothorax.

On limited assessment there is no acute skeletal process.
IMPRESSION: Improved aeration with diminished RIGHT lower lobe airspace disease
and resolution of pleural fluid though still with persistent RIGHT
basilar consolidation.

Blunting of RIGHT costodiaphragmatic sulcus, no visible effusion on
lateral view may reflect scarring, attention on follow-up.

## 2022-12-19 IMAGING — DX DG CHEST 2V
2 series · 2 of 2 positions shown · non-contrast
Comparison: Multiple priors, including radiographs 02/23/2021 and
01/17/2021. CT 12/14/2020 and 01/13/2021.

CLINICAL DATA: Follow up pneumonia. Right lung biopsy performed
01/17/2021 demonstrated pneumonia and no evidence of malignancy.

EXAM:
CHEST - 2 VIEW

[chest pa]
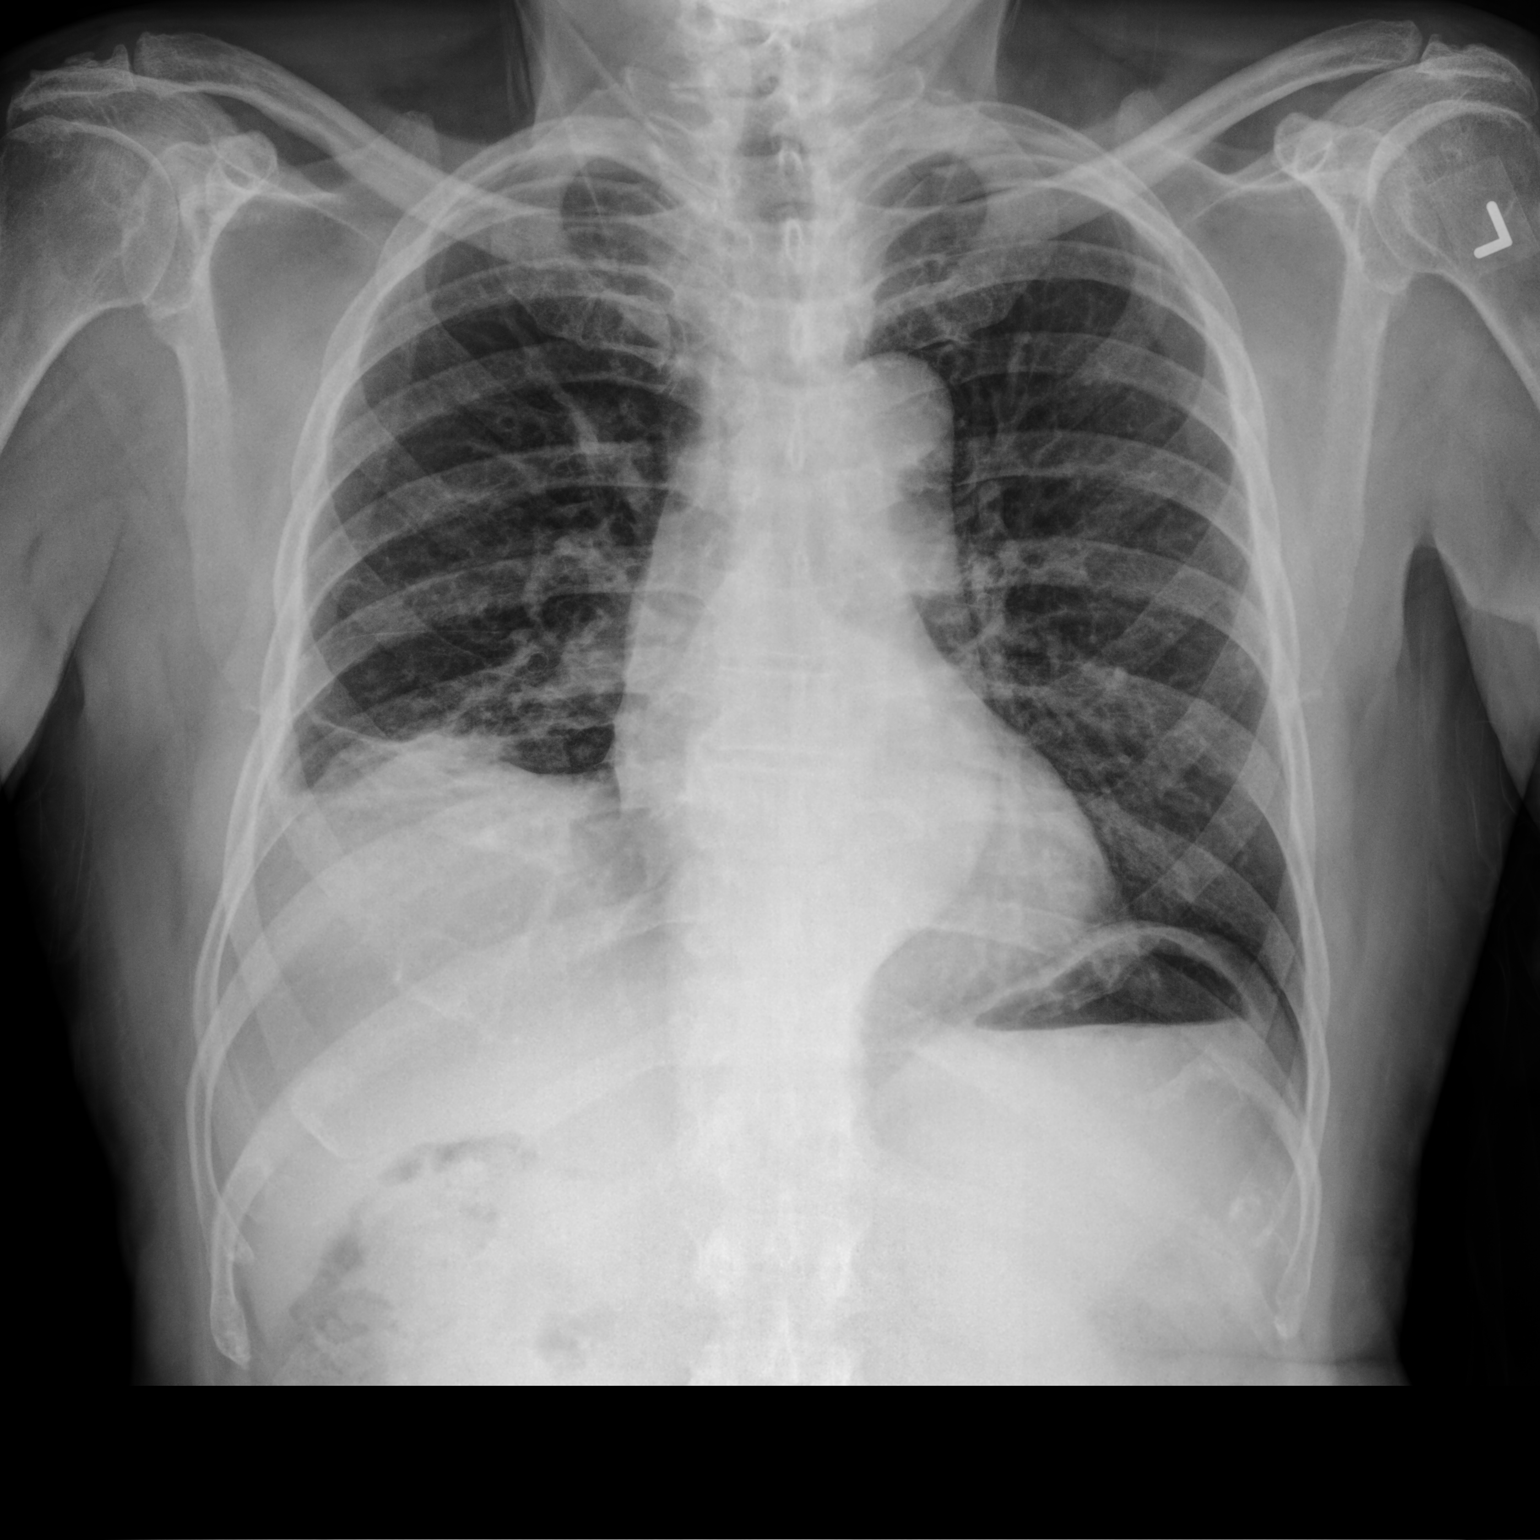

[chest lat]
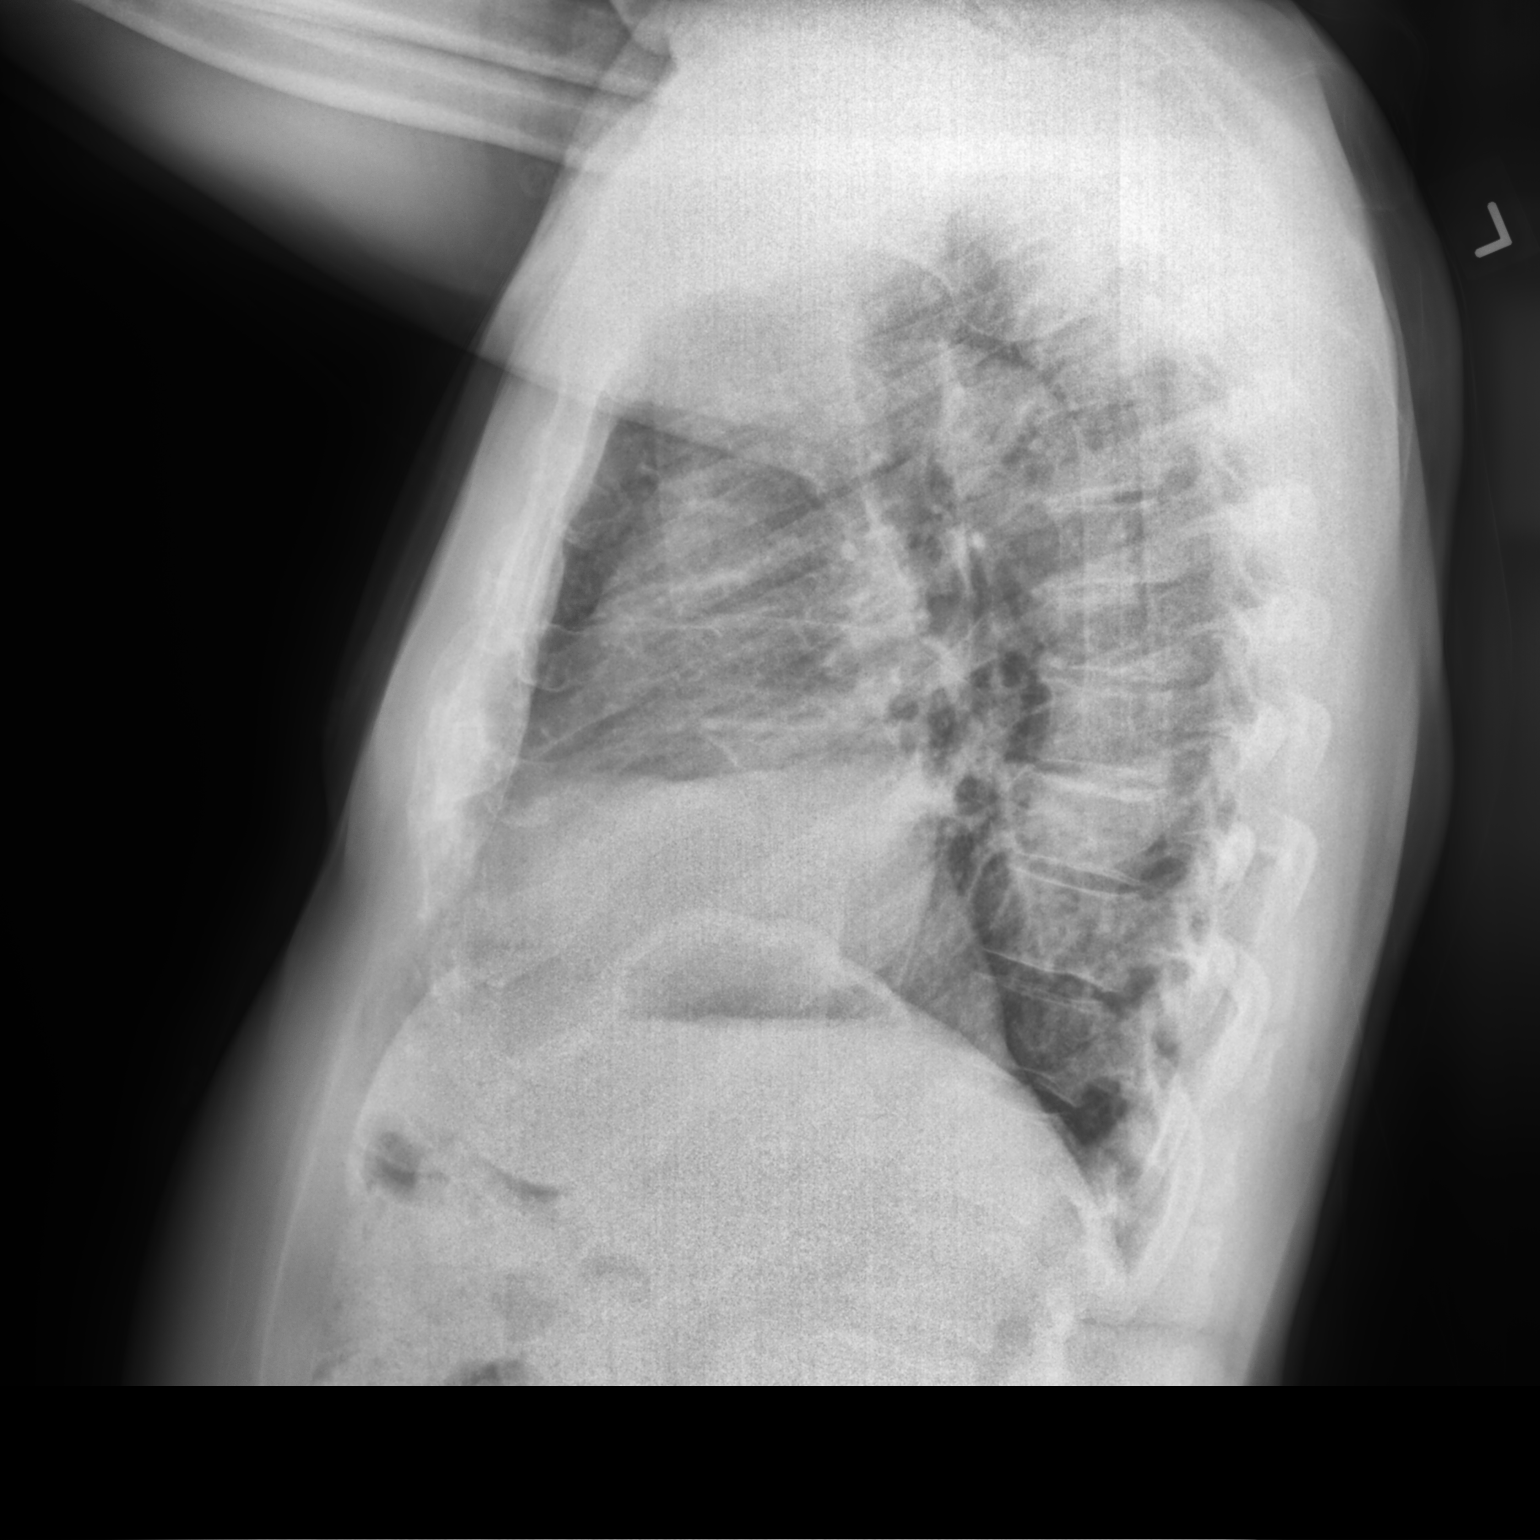

[2 of 2 positions shown; findings below may reference images not displayed]

FINDINGS: The heart size and mediastinal contours are stable with aortic
tortuosity. Residual airspace disease at the right lung base appears
unchanged. There is stable mild elevation of the right hemidiaphragm
and mild blunting of the right cardiophrenic angle. The left lung is
clear. The bones appear unremarkable.
IMPRESSION: Unchanged residual right basilar pulmonary opacity which may reflect
residual inflammation or postinflammatory scarring. No new findings.

## 2023-01-08 DIAGNOSIS — E7800 Pure hypercholesterolemia, unspecified: Secondary | ICD-10-CM | POA: Diagnosis not present

## 2023-01-08 DIAGNOSIS — R7303 Prediabetes: Secondary | ICD-10-CM | POA: Diagnosis not present

## 2023-01-08 DIAGNOSIS — Z7901 Long term (current) use of anticoagulants: Secondary | ICD-10-CM | POA: Diagnosis not present

## 2023-01-08 DIAGNOSIS — I82402 Acute embolism and thrombosis of unspecified deep veins of left lower extremity: Secondary | ICD-10-CM | POA: Diagnosis not present

## 2023-03-21 DIAGNOSIS — Z1212 Encounter for screening for malignant neoplasm of rectum: Secondary | ICD-10-CM | POA: Diagnosis not present

## 2023-03-21 DIAGNOSIS — Z1211 Encounter for screening for malignant neoplasm of colon: Secondary | ICD-10-CM | POA: Diagnosis not present

## 2023-03-30 LAB — COLOGUARD: COLOGUARD: NEGATIVE

## 2023-04-09 DIAGNOSIS — I82402 Acute embolism and thrombosis of unspecified deep veins of left lower extremity: Secondary | ICD-10-CM | POA: Diagnosis not present

## 2023-04-09 DIAGNOSIS — E7800 Pure hypercholesterolemia, unspecified: Secondary | ICD-10-CM | POA: Diagnosis not present

## 2023-04-09 DIAGNOSIS — R7303 Prediabetes: Secondary | ICD-10-CM | POA: Diagnosis not present

## 2023-04-09 DIAGNOSIS — Z7901 Long term (current) use of anticoagulants: Secondary | ICD-10-CM | POA: Diagnosis not present

## 2023-07-30 DIAGNOSIS — Z0001 Encounter for general adult medical examination with abnormal findings: Secondary | ICD-10-CM | POA: Diagnosis not present

## 2023-07-30 DIAGNOSIS — I499 Cardiac arrhythmia, unspecified: Secondary | ICD-10-CM | POA: Diagnosis not present

## 2023-07-30 DIAGNOSIS — E7800 Pure hypercholesterolemia, unspecified: Secondary | ICD-10-CM | POA: Diagnosis not present

## 2023-07-30 DIAGNOSIS — R7303 Prediabetes: Secondary | ICD-10-CM | POA: Diagnosis not present

## 2023-07-30 DIAGNOSIS — Z7901 Long term (current) use of anticoagulants: Secondary | ICD-10-CM | POA: Diagnosis not present

## 2023-07-30 DIAGNOSIS — I82402 Acute embolism and thrombosis of unspecified deep veins of left lower extremity: Secondary | ICD-10-CM | POA: Diagnosis not present
